# Patient Record
Sex: Male | Born: 1950 | Race: White | Hispanic: No | Marital: Married | Smoking: Former smoker
Health system: Southern US, Community
[De-identification: ages and names within clinical notes are randomized; demographics above are authoritative.]

## PROBLEM LIST (undated history)

## (undated) DIAGNOSIS — N486 Induration penis plastica: Secondary | ICD-10-CM

## (undated) DIAGNOSIS — E538 Deficiency of other specified B group vitamins: Secondary | ICD-10-CM

## (undated) DIAGNOSIS — F1020 Alcohol dependence, uncomplicated: Secondary | ICD-10-CM

## (undated) DIAGNOSIS — K3532 Acute appendicitis with perforation and localized peritonitis, without abscess: Secondary | ICD-10-CM

## (undated) DIAGNOSIS — I2699 Other pulmonary embolism without acute cor pulmonale: Secondary | ICD-10-CM

## (undated) DIAGNOSIS — R413 Other amnesia: Secondary | ICD-10-CM

## (undated) DIAGNOSIS — F1027 Alcohol dependence with alcohol-induced persisting dementia: Secondary | ICD-10-CM

## (undated) DIAGNOSIS — D751 Secondary polycythemia: Secondary | ICD-10-CM

## (undated) DIAGNOSIS — H1010 Acute atopic conjunctivitis, unspecified eye: Secondary | ICD-10-CM

## (undated) DIAGNOSIS — D45 Polycythemia vera: Secondary | ICD-10-CM

## (undated) DIAGNOSIS — R270 Ataxia, unspecified: Secondary | ICD-10-CM

## (undated) DIAGNOSIS — E785 Hyperlipidemia, unspecified: Secondary | ICD-10-CM

## (undated) DIAGNOSIS — D126 Benign neoplasm of colon, unspecified: Secondary | ICD-10-CM

## (undated) DIAGNOSIS — I10 Essential (primary) hypertension: Secondary | ICD-10-CM

## (undated) HISTORY — DX: Induration penis plastica: N48.6

## (undated) HISTORY — DX: Alcohol dependence with alcohol-induced persisting dementia: F10.27

## (undated) HISTORY — DX: Acute atopic conjunctivitis, unspecified eye: H10.10

## (undated) HISTORY — DX: Deficiency of other specified B group vitamins: E53.8

## (undated) HISTORY — DX: Benign neoplasm of colon, unspecified: D12.6

## (undated) HISTORY — DX: Polycythemia vera: D45

## (undated) HISTORY — DX: Other pulmonary embolism without acute cor pulmonale: I26.99

## (undated) HISTORY — DX: Acute appendicitis with perforation and localized peritonitis, without abscess: K35.32

## (undated) HISTORY — DX: Essential (primary) hypertension: I10

## (undated) HISTORY — PX: INGUINAL HERNIA REPAIR: SUR1180

## (undated) HISTORY — DX: Ataxia, unspecified: R27.0

## (undated) HISTORY — DX: Other amnesia: R41.3

## (undated) HISTORY — DX: Hyperlipidemia, unspecified: E78.5

## (undated) HISTORY — DX: Alcohol dependence, uncomplicated: F10.20

---

## 1971-12-08 HISTORY — PX: TONSILLECTOMY: SUR1361

## 1995-12-08 HISTORY — PX: FRACTURE SURGERY: SHX138

## 2003-05-02 ENCOUNTER — Ambulatory Visit (HOSPITAL_COMMUNITY): Admission: RE | Admit: 2003-05-02 | Discharge: 2003-05-02 | Payer: Self-pay | Admitting: Neurology

## 2003-05-04 ENCOUNTER — Ambulatory Visit (HOSPITAL_COMMUNITY): Admission: RE | Admit: 2003-05-04 | Discharge: 2003-05-04 | Payer: Self-pay | Admitting: Neurology

## 2003-05-04 ENCOUNTER — Encounter: Payer: Self-pay | Admitting: Neurology

## 2003-05-21 ENCOUNTER — Encounter (INDEPENDENT_AMBULATORY_CARE_PROVIDER_SITE_OTHER): Payer: Self-pay | Admitting: *Deleted

## 2003-05-21 ENCOUNTER — Ambulatory Visit (HOSPITAL_COMMUNITY): Admission: RE | Admit: 2003-05-21 | Discharge: 2003-05-21 | Payer: Self-pay | Admitting: Neurology

## 2003-10-14 ENCOUNTER — Ambulatory Visit (HOSPITAL_COMMUNITY): Admission: RE | Admit: 2003-10-14 | Discharge: 2003-10-14 | Payer: Self-pay | Admitting: Neurology

## 2007-03-03 ENCOUNTER — Ambulatory Visit (HOSPITAL_COMMUNITY)
Admission: RE | Admit: 2007-03-03 | Discharge: 2007-03-03 | Payer: Self-pay | Admitting: Physical Medicine and Rehabilitation

## 2008-12-28 ENCOUNTER — Ambulatory Visit (HOSPITAL_COMMUNITY): Admission: RE | Admit: 2008-12-28 | Discharge: 2008-12-28 | Payer: Self-pay | Admitting: Surgery

## 2011-03-23 LAB — DIFFERENTIAL
Basophils Absolute: 0 10*3/uL (ref 0.0–0.1)
Basophils Relative: 1 % (ref 0–1)
Eosinophils Absolute: 0.1 10*3/uL (ref 0.0–0.7)
Eosinophils Relative: 1 % (ref 0–5)
Lymphocytes Relative: 31 % (ref 12–46)
Lymphs Abs: 1.5 10*3/uL (ref 0.7–4.0)
Monocytes Absolute: 0.6 10*3/uL (ref 0.1–1.0)
Monocytes Relative: 12 % (ref 3–12)
Neutro Abs: 2.7 10*3/uL (ref 1.7–7.7)
Neutrophils Relative %: 56 % (ref 43–77)

## 2011-03-23 LAB — COMPREHENSIVE METABOLIC PANEL
ALT: 20 U/L (ref 0–53)
AST: 21 U/L (ref 0–37)
Albumin: 3.9 g/dL (ref 3.5–5.2)
Alkaline Phosphatase: 47 U/L (ref 39–117)
BUN: 6 mg/dL (ref 6–23)
CO2: 28 mEq/L (ref 19–32)
Calcium: 9.8 mg/dL (ref 8.4–10.5)
Chloride: 101 mEq/L (ref 96–112)
Creatinine, Ser: 0.78 mg/dL (ref 0.4–1.5)
GFR calc Af Amer: >60 mL/min (ref >60)
GFR calc non Af Amer: >60 mL/min (ref >60)
Glucose, Bld: 101 mg/dL — ABNORMAL HIGH (ref 70–99)
Potassium: 4.3 mEq/L (ref 3.5–5.1)
Sodium: 137 mEq/L (ref 135–145)
Total Bilirubin: 0.8 mg/dL (ref 0.3–1.2)
Total Protein: 6.6 g/dL (ref 6.0–8.3)

## 2011-03-23 LAB — CBC
HCT: 44.1 % (ref 39.0–52.0)
Hemoglobin: 15.1 g/dL (ref 13.0–17.0)
MCHC: 34.3 g/dL (ref 30.0–36.0)
MCV: 100.6 fL — ABNORMAL HIGH (ref 78.0–100.0)
Platelets: 203 10*3/uL (ref 150–400)
RBC: 4.38 MIL/uL (ref 4.22–5.81)
RDW: 13.2 % (ref 11.5–15.5)
WBC: 4.9 10*3/uL (ref 4.0–10.5)

## 2011-03-23 LAB — ETHANOL: Alcohol, Ethyl (B): <5 mg/dL (ref 0–10)

## 2011-04-21 NOTE — Op Note (Signed)
NAME:  John [name], John [name]              ACCOUNT NO.:  [id number]   MEDICAL RECORD NO.:  [id number]          PATIENT TYPE:  AMB   LOCATION:  DAY                          FACILITY:  [facility]   PHYSICIAN:  Sandria Bales. Ezzard Standing, M.D.  DATE OF BIRTH:  1951-05-29   DATE OF PROCEDURE:  12/28/2008  DATE OF DISCHARGE:                               OPERATIVE REPORT   Date of surgey ?   PREOPERATIVE DIAGNOSIS:  Bilateral inguinal hernias, left greater than  right.   POSTOPERATIVE DIAGNOSIS:  Bilateral direct inguinal hernias, left  medium, right is small.   PROCEDURES:  Laparoscopic bilateral inguinal hernia repair with precut  atrium mesh.   SURGEON:  D. Ezzard Standing, M.D.   FIRST ASSISTANT:  None.   ANESTHESIA:  General with about 20 mL of 4% Marcaine.   COMPLICATIONS:  None.   INDICATIONS FOR PROCEDURE:  Mr. John [name] is a 60 year old white male who  is a patient of Dr. Camillo Flaming who has symptomatic bilateral inguinal  hernias with the left larger than the right.  I discussed with him about  repairing these with laparoscopic or open and he preferred laparoscopic  repair.   I discussed with him the indications and potential complications of  repair.  Potential complications include but are not limited to  bleeding, infection, nerve injury, recurrence of the hernia.   OPERATIVE NOTE:  The patient was placed in the supine position and given  a general endotracheal anesthetic.  His abdomen was prepped with  chlorhexidine CHG and sterilely draped.  He had a Foley catheter in  place.  He was given 1 gram of Ancef for this procedure.  A time-out was  held identifying the patient and the procedure.   Because his left inguinal hernia was larger, I went on the left side.  As far as my dissection, I went down the midline to the anterior rectus  fascia.  I inserted the PBD balloon, but the first time I inserted it,  the balloon went intra-abdominal, so I removed the balloon and got a  second balloon.   Again, I tried to pass this balloon posterior to the  rectus abdominal muscle, anterior to the posterior rectus fascia into  the preperitoneal space, which I got on the second balloon dissection.   After insufflating the balloon under direct visualization, I then  removed the balloon and placed the 12 mm Hassan trocar at the  infraumbilical incision.  I then placed a 5 mm in the left lower  quadrant and a 5-mm in the right lower quadrant and I dissected out  identifying on both sides pubic bone and Cooper's ligament. On the left  side, he had sort of a medium-sized direct inguinal hernia in the  lateral inguinal floor and on the right side, he had maybe a small  defect in the lateral inguinal floor.  I  could not find an indirect  hernia on either side.   I then carried out a mesh repair using the Atrium precut C-QUR-lite  mesh.  On both sides, I put the mesh around the cord structures.  I  stapled  the mesh medially to the pubic bone, inferiorly to Cooper's  ligament, superiorly to the transversalis fascia.  I avoided staples  lateral to the cord structures and inferior to the ilioinguinal  ligament.  I tacked the tail that I put around the cord down to Cooper's  ligament.   On both sides, the mesh looked good and lay flat.  The patient tolerated  the procedure well.  There was no bleeding or other problems at the end  of the procedure, so I deflated the abdomen and removed the trocars.  There was no bleeding at the trocar site.  The fascial defect was closed  with interrupted 0-Vicryl suture.  The skin site was closed with 4-0  Monocryl suture, painted with tincture of Benzoin and Steri-Strip.   The patient tolerated the procedure well and was transported to the  recovery room in good condition.  Sponge and needle counts were correct  at the end of the case.  The plan is to let [name] go today.      Sandria Bales. Ezzard Standing, M.D.  Electronically Signed     DHN/MEDQ  D:   12/28/2008  T:  12/28/2008  Job:  [id number]   cc:   Dossie Der, MD  Fax: [phone]

## 2013-05-30 ENCOUNTER — Ambulatory Visit: Payer: Self-pay | Admitting: Neurology

## 2013-07-04 ENCOUNTER — Encounter: Payer: Self-pay | Admitting: Neurology

## 2013-07-05 ENCOUNTER — Ambulatory Visit (INDEPENDENT_AMBULATORY_CARE_PROVIDER_SITE_OTHER): Payer: BC Managed Care – PPO | Admitting: Neurology

## 2013-07-05 ENCOUNTER — Encounter: Payer: Self-pay | Admitting: Neurology

## 2013-07-05 VITALS — BP 128/83 | HR 53 | Ht 69.5 in | Wt 154.0 lb

## 2013-07-05 DIAGNOSIS — R279 Unspecified lack of coordination: Secondary | ICD-10-CM

## 2013-07-05 DIAGNOSIS — F1027 Alcohol dependence with alcohol-induced persisting dementia: Secondary | ICD-10-CM

## 2013-07-05 DIAGNOSIS — R270 Ataxia, unspecified: Secondary | ICD-10-CM

## 2013-07-05 DIAGNOSIS — F10988 Alcohol use, unspecified with other alcohol-induced disorder: Secondary | ICD-10-CM

## 2013-07-05 HISTORY — DX: Ataxia, unspecified: R27.0

## 2013-07-05 HISTORY — DX: Alcohol dependence with alcohol-induced persisting dementia: F10.27

## 2013-07-05 MED ORDER — MEMANTINE HCL 28 X 5 MG & 21 X 10 MG PO TABS: ORAL_TABLET | ORAL | Status: DC

## 2013-07-05 NOTE — Patient Instructions (Signed)
Chronic Alcohol use  Alcoholism is an addiction to alcohol. Addiction is a medical illness. It is not an one-time incident of heavy drinking that defines the disease of alcoholism.  The characteristics of addictive disease, such as alcoholism, include behaviors that the person finds pleasurable, at least initially. In alcohol addiction, drinking causes chemical changes in brain activity. This can lead to frequent cravings for alcohol. Unfortunately over time, an increased amount of alcohol is needed to produce the pleasure (tolerance). As a result, the person will start to feel uncomfortable symptoms when he or she is not drinking (withdrawal). Over time, a bad cycle develops. When painful withdrawal symptoms start to appear, alcohol is needed to make the symptoms go away. During this process, the person addicted to alcohol may become so used to the effects of alcohol that the usual signs of intoxication such as slurred speech, a staggering walk, or sleepiness may no longer be shown. Many people addicted to alcohol are able to function and even complete tasks. CAUSES   Drinking heavily and frequently.  Other factors like genetics. SYMPTOMS   Headaches.  Frequent trouble falling or staying asleep (insomnia).  Irritability.  Uncontrolled shaking or movement (tremors).  Forgetting events (brownouts) or passing out (blackouts).  Seizures or hallucinations (delirium tremens).  Problems at work or at home that are related to drinking.  Medical problems related to drinking such as heart disease, stroke, high blood pressure, diabetes, stomach ulcers, bleeding from the GI tract, and liver failure.  Trauma (falls, broken bones, automobile crashes). TREATMENT  Alcoholism usually gets worse over time and almost never gets better without treatment. Your caregiver can help recommend a course of treatment for you depending on how severe your symptoms are and the level of your alcohol abuse. In some  patients, stopping alcohol use or even decreasing use can bring about withdrawal symptoms that are dangerous or even deadly. For this reason, hospitalization is sometimes required to medically stabilize a patient.  If hospitalization is not required, but the risk of withdrawal is high, a detoxification (detox) facility may be recommended as an initial treatment step. In a detox center, medications can be given to protect against seizures and other withdrawal symptoms. Rehabilitation treatment may also be necessary. This is the process of treating the psychological and lifestyle element of addiction. There is both inpatient and outpatient rehabilitation treatment. Inpatient programs help patients through a systematic plan of psychological questioning, both individually and in groups, and at times using a "twelve-step" format. Outpatient rehabilitation programs have a similar structure and are aimed at promoting continued sobriety and preventing relapse into addiction. Make treatment decisions together with your caregiver. HOME CARE INSTRUCTIONS   If you are concerned about your alcohol use, talk to someone who can help. Trying to quit on your own is not easy. It can even be medically dangerous. This is especially true if you have been drinking heavily for a long time.  Call your caregiver, Alcoholics Anonymous, or other alcoholic treatment programs for help.  AL-ANON and ALA-TEEN are support groups for friends and family members of an alcohol or drug dependent person. These people also often need help too. For information about these organizations, check your phone directory or the internet. You can also call a local alcohol or chemical dependency treatment center. Document Released: 12/31/2004 Document Revised: 02/15/2012 Document Reviewed: 05/09/2010 [facility] Patient Information 2014 [place], [place].

## 2013-07-05 NOTE — Progress Notes (Addendum)
[facility]  Provider:  Melvyn Novas, M D  Referring Provider: No ref. provider found Primary Care Physician:  Gweneth Dimitri, MD  Chief Complaint  Patient presents with  . Follow-up    memory, ataxia, rm 10,MMSE:13/30,AFT:9    HPI:  John [name] is a 62 y.o. male  Is seen here as a referral/ revisit  from Dr. Corliss Blacker for memory loss.  The patient was first evaluated in 2004 for memory problems at the time he presented his tremor and a high alcohol DD use powder he was also still an active smoker in 2003 2 2004. In March of this year he presented again and his wife technologist work finding difficulties trouble for example to remember the names of the plants he had gone in the garden, sometimes he was unable to communicate fluidly on the phone especially when it came to on seeing the business related form colds. 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 She has meanwhile decided to close the business and apply for disability.  Now he has meanwhile stop smoking he still consumes about 4 -6 drinks a day. In March it is Mini-Mental Status Examination was 21.0 30 his laboratory results showed B12 deficiency and likely a deficiency for other B. vitamins, microcytosis  The last time the patient had completely stopped drinking for a period of time was 6 years ago in preparation for hernia surgery. His physical exam continued to show memory loss, hyperreflexia, nystagmus and ataxia. There was neuropathy as well. The family is well aware of the memory loss relation contribution of Aquaphor and a supportive. They're discussing today to get Mr. [name] entered treatment he will try to stop drinking on his on both think he would find more support in seeing Dr. Sharyon Medicus and visiting AA meetings if possible.  Today's Mini-Mental Status Examination short-circuiting out of 30 points his animal fluency test was only 9, he was not able to write a fluent sentence and he has a  mild tremor when writing. 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    Review of Systems: Out of a complete 14 system review, the patient complains of only the following symptoms, and all other reviewed systems are negative.  History   Social History  . Marital Status: Married    Spouse Name: N/A    Number of Children: 2  . Years of Education: N/A   Occupational History  . [employer]crete Business    Social History Main Topics  . Smoking status: Former Smoker    Quit date: 12/07/1972  . Smokeless tobacco: Not on file  . Alcohol Use: Yes     Comment: 3 glasses of wine daily  . Drug Use: No  . Sexually Active: Not on file   Other Topics Concern  . Not on file   Social History Narrative  . No narrative on file    Family History  Problem Relation Age of Onset  . Glaucoma Mother   . Stroke Father     Past Medical History  Diagnosis Date  . Memory loss     dementia was in question in 2004  . Alcohol dependence   . Hypertension   . Hyperlipemia   . Vitamin B 12 deficiency   . Adenomatous colon polyp   . Peyronie's disease     presumptive,resolved  . Conjunctivitis, allergic     Past Surgical History  Procedure Laterality Date  .  Tonsillectomy  1973  . Inguinal hernia repair Bilateral     w/ mesh  . Fracture surgery  1997    leg    Current Outpatient Prescriptions  Medication Sig Dispense Refill  . B COMPLEX VITAMINS PO Take by mouth 3 (three) times daily.      Marland Kitchen lisinopril (PRINIVIL,ZESTRIL) 20 MG tablet Take 20 mg by mouth daily.       No current facility-administered medications for this visit.    Allergies as of 07/05/2013 - Review Complete 07/05/2013  Allergen Reaction Noted  . Seasonal ic (cholestatin)  07/04/2013    Vitals: BP 128/83  Pulse 53  Ht 5' 9.5" (1.765 m)  Wt 154 lb (69.854 kg)  BMI 22.42 kg/m2 Last Weight:  Wt Readings from Last 1 Encounters:  07/05/13 154 lb  (69.854 kg)   Last Height:   Ht Readings from Last 1 Encounters:  07/05/13 5' 9.5" (1.765 m)   BMI  .  Physical exam:  General: The patient is awake, alert and appears not in acute distress.  Head: Normocephalic, atraumatic. Neck is supple. Cardiovascular:  Regular rate and rhythm, without  murmurs or carotid bruit, and without distended neck veins. Respiratory: Lungs are clear to auscultation. Skin:  Without evidence of edema, has plantar and palmar erythema , spider navi , no  rash Trunk: BMI is normal, normal  posture.  Neurologic exam : The patient is awake and alert, oriented to place and time.  Memory impaired -  There is a reduced attention span & concentration ability. Speech is non- fluent with repetition . Mood and affect are appropriate.  Cranial nerves: Pupils are equal and briskly reactive to light. Funduscopic exam without  evidence of pallor or edema. Extraocular movements with  Nystagmus. Wernicke .  Visual fields by finger perimetry are intact. Hearing to finger rub intact.  Facial sensation intact to fine touch. Facial motor strength is symmetric and tongue and uvula move midline.  Motor exam:  Brisk   tone and normal muscle bulk and symmetric normal strength in all extremities.  Sensory:  Fine touch, pinprick and vibration were tested in all extremities. Proprioception is  Reduced to fine touch and vibration normal.  Coordination: Rapid alternating movements in the fingers/hands is slowed . Finger-to-nose maneuver with ataxia, dysmetria or tremor.  Gait and station: Patient walks without assistive device . Strength within normal limits. Stance is stable and normal , but romberg is positive . Tandem gait is fragmented. Deep tendon reflexes: in the  upper and lower extremities are symmetric and intact. Babinski maneuver response is  downgoing.   Assessment:  After physical and neurologic examination, review of laboratory studies, imaging, neurophysiology testing and  pre-existing records, assessment is ETOH dependency with alcohol induced dementia and  Organic brain changes.   Plan:  Treatment plan and additional workup :  A referral to alcohol treatment - Dr March Rummage , AA meetings and possible evaluation for home safety.   Positive family history of alcoholism  , brother , possible mother.  Both parents had alzheimer's type dementia.  Added Namenda . Continue B vitamins.  Patient had a head injury  in 1998-  MRI ordered.

## 2013-07-05 NOTE — Addendum Note (Signed)
Addended by: Melvyn Novas on: 07/05/2013 09:20 AM   Modules accepted: Orders

## 2013-08-10 ENCOUNTER — Other Ambulatory Visit: Payer: Self-pay

## 2013-08-10 MED ORDER — MEMANTINE HCL ER 28 MG PO CP24: 28.0000 mg | ORAL_CAPSULE | Freq: Every day | ORAL | Status: DC

## 2013-10-06 ENCOUNTER — Ambulatory Visit: Payer: Self-pay | Admitting: Nurse Practitioner

## 2014-12-07 DIAGNOSIS — I2699 Other pulmonary embolism without acute cor pulmonale: Secondary | ICD-10-CM

## 2014-12-07 HISTORY — DX: Other pulmonary embolism without acute cor pulmonale: I26.99

## 2015-06-18 ENCOUNTER — Telehealth: Payer: Self-pay | Admitting: Neurology

## 2015-06-18 NOTE — Telephone Encounter (Signed)
Pts wife called concerning her husband, he is having a hard time breathing and feels that it is related to his dementia, she was told that if she feels that it is urgent to go the ER for immediate help. Please call and advise [phone]

## 2015-06-18 NOTE — Telephone Encounter (Signed)
Returned pt's call, no answer at the number listed. If pt is having trouble breathing, he needs to go to the ER.

## 2015-06-18 NOTE — Telephone Encounter (Signed)
Tried to call again, no answer. Left a message asking them to call us back if they need [place].  Pt needs to be advised to go to ER if he is having trouble breathing.

## 2015-06-19 NOTE — Telephone Encounter (Signed)
Attempted to call the pt for the third time at the number listed, no answer.

## 2015-06-24 ENCOUNTER — Inpatient Hospital Stay (HOSPITAL_COMMUNITY): Payer: Medicare Other

## 2015-06-24 ENCOUNTER — Encounter (HOSPITAL_COMMUNITY): Payer: Self-pay | Admitting: Emergency Medicine

## 2015-06-24 ENCOUNTER — Inpatient Hospital Stay (HOSPITAL_COMMUNITY)
Admission: EM | Admit: 2015-06-24 | Discharge: 2015-06-25 | DRG: 299 | Disposition: A | Discharge status: Home / Self Care | Payer: Medicare Other | Attending: Internal Medicine | Admitting: Internal Medicine

## 2015-06-24 DIAGNOSIS — I82411 Acute embolism and thrombosis of right femoral vein: Secondary | ICD-10-CM | POA: Diagnosis present

## 2015-06-24 DIAGNOSIS — I82431 Acute embolism and thrombosis of right popliteal vein: Secondary | ICD-10-CM | POA: Diagnosis present

## 2015-06-24 DIAGNOSIS — Z87891 Personal history of nicotine dependence: Secondary | ICD-10-CM

## 2015-06-24 DIAGNOSIS — F1010 Alcohol abuse, uncomplicated: Secondary | ICD-10-CM | POA: Insufficient documentation

## 2015-06-24 DIAGNOSIS — E785 Hyperlipidemia, unspecified: Secondary | ICD-10-CM | POA: Diagnosis present

## 2015-06-24 DIAGNOSIS — Z888 Allergy status to other drugs, medicaments and biological substances status: Secondary | ICD-10-CM | POA: Diagnosis not present

## 2015-06-24 DIAGNOSIS — E538 Deficiency of other specified B group vitamins: Secondary | ICD-10-CM | POA: Diagnosis present

## 2015-06-24 DIAGNOSIS — F1020 Alcohol dependence, uncomplicated: Secondary | ICD-10-CM | POA: Diagnosis present

## 2015-06-24 DIAGNOSIS — F1027 Alcohol dependence with alcohol-induced persisting dementia: Secondary | ICD-10-CM | POA: Diagnosis present

## 2015-06-24 DIAGNOSIS — R0600 Dyspnea, unspecified: Secondary | ICD-10-CM | POA: Diagnosis present

## 2015-06-24 DIAGNOSIS — I2699 Other pulmonary embolism without acute cor pulmonale: Secondary | ICD-10-CM

## 2015-06-24 DIAGNOSIS — N486 Induration penis plastica: Secondary | ICD-10-CM | POA: Diagnosis present

## 2015-06-24 DIAGNOSIS — I10 Essential (primary) hypertension: Secondary | ICD-10-CM | POA: Diagnosis present

## 2015-06-24 DIAGNOSIS — R270 Ataxia, unspecified: Secondary | ICD-10-CM | POA: Diagnosis present

## 2015-06-24 DIAGNOSIS — R0602 Shortness of breath: Secondary | ICD-10-CM

## 2015-06-24 DIAGNOSIS — I824Y1 Acute embolism and thrombosis of unspecified deep veins of right proximal lower extremity: Secondary | ICD-10-CM | POA: Diagnosis present

## 2015-06-24 DIAGNOSIS — Z86711 Personal history of pulmonary embolism: Secondary | ICD-10-CM | POA: Diagnosis present

## 2015-06-24 LAB — URINALYSIS, ROUTINE W REFLEX MICROSCOPIC
Bilirubin Urine: NEGATIVE
Glucose, UA: NEGATIVE mg/dL
Hgb urine dipstick: NEGATIVE
Ketones, ur: 15 mg/dL — AB
LEUKOCYTES UA: NEGATIVE
Nitrite: NEGATIVE
Protein, ur: NEGATIVE mg/dL
Specific Gravity, Urine: 1.028 (ref 1.005–1.030)
Urobilinogen, UA: 0.2 mg/dL (ref 0.0–1.0)
pH: 7 (ref 5.0–8.0)

## 2015-06-24 LAB — CBC
HCT: 47.7 % (ref 39.0–52.0)
Hemoglobin: 16.2 g/dL (ref 13.0–17.0)
MCH: 34.6 pg — ABNORMAL HIGH (ref 26.0–34.0)
MCHC: 34 g/dL (ref 30.0–36.0)
MCV: 101.9 fL — ABNORMAL HIGH (ref 78.0–100.0)
Platelets: 154 10*3/uL (ref 150–400)
RBC: 4.68 MIL/uL (ref 4.22–5.81)
RDW: 13.4 % (ref 11.5–15.5)
WBC: 7.6 10*3/uL (ref 4.0–10.5)

## 2015-06-24 LAB — HEPATIC FUNCTION PANEL
ALT: 14 U/L — ABNORMAL LOW (ref 17–63)
AST: 22 U/L (ref 15–41)
Albumin: 3.9 g/dL (ref 3.5–5.0)
Alkaline Phosphatase: 61 U/L (ref 38–126)
Bilirubin, Direct: 0.1 mg/dL (ref 0.1–0.5)
Indirect Bilirubin: 0.7 mg/dL (ref 0.3–0.9)
Total Bilirubin: 0.8 mg/dL (ref 0.3–1.2)
Total Protein: 6.7 g/dL (ref 6.5–8.1)

## 2015-06-24 LAB — BASIC METABOLIC PANEL
Anion gap: 13 (ref 5–15)
BUN: 11 mg/dL (ref 6–20)
CO2: 24 mmol/L (ref 22–32)
Calcium: 9.8 mg/dL (ref 8.9–10.3)
Chloride: 101 mmol/L (ref 101–111)
Creatinine, Ser: 0.96 mg/dL (ref 0.61–1.24)
GFR calc Af Amer: >60 mL/min (ref >60)
GFR calc non Af Amer: >60 mL/min (ref >60)
Glucose, Bld: 96 mg/dL (ref 65–99)
Potassium: 4 mmol/L (ref 3.5–5.1)
Sodium: 138 mmol/L (ref 135–145)

## 2015-06-24 LAB — BRAIN NATRIURETIC PEPTIDE: B Natriuretic Peptide: 123.8 pg/mL — ABNORMAL HIGH (ref 0.0–100.0)

## 2015-06-24 LAB — I-STAT TROPONIN, ED: TROPONIN I, POC: 0.01 ng/mL (ref 0.00–0.08)

## 2015-06-24 LAB — PROTIME-INR
INR: 1.1 (ref 0.00–1.49)
Prothrombin Time: 14.4 seconds (ref 11.6–15.2)

## 2015-06-24 MED ORDER — SODIUM CHLORIDE 0.45 % IV SOLN
INTRAVENOUS | Status: DC
  Administered 2015-06-24: 23:00:00 via INTRAVENOUS

## 2015-06-24 MED ORDER — ALBUTEROL SULFATE HFA 108 (90 BASE) MCG/ACT IN AERS: 1.0000 | INHALATION_SPRAY | Freq: Four times a day (QID) | RESPIRATORY_TRACT | Status: DC | PRN

## 2015-06-24 MED ORDER — MAGNESIUM HYDROXIDE 400 MG/5ML PO SUSP
30.0000 mL | Freq: Every day | ORAL | Status: DC | PRN
Start: 2015-06-24 — End: 2015-06-25
  Filled 2015-06-24: qty 30

## 2015-06-24 MED ORDER — ADULT MULTIVITAMIN W/MINERALS CH
1.0000 | ORAL_TABLET | Freq: Every day | ORAL | Status: DC
Start: 2015-06-24 — End: 2015-06-25
  Administered 2015-06-25: 1 via ORAL
  Filled 2015-06-24 (×2): qty 1

## 2015-06-24 MED ORDER — ALBUTEROL SULFATE (2.5 MG/3ML) 0.083% IN NEBU: 2.5000 mg | INHALATION_SOLUTION | Freq: Four times a day (QID) | RESPIRATORY_TRACT | Status: DC | PRN

## 2015-06-24 MED ORDER — SODIUM CHLORIDE 0.9 % IJ SOLN: 3.0000 mL | Freq: Two times a day (BID) | INTRAMUSCULAR | Status: DC

## 2015-06-24 MED ORDER — B COMPLEX-C PO TABS
1.0000 | ORAL_TABLET | Freq: Three times a day (TID) | ORAL | Status: DC
  Administered 2015-06-25 (×2): 1 via ORAL
  Filled 2015-06-24 (×4): qty 1

## 2015-06-24 MED ORDER — ACETAMINOPHEN 650 MG RE SUPP: 650.0000 mg | Freq: Four times a day (QID) | RECTAL | Status: DC | PRN

## 2015-06-24 MED ORDER — HEPARIN BOLUS VIA INFUSION
4000.0000 [IU] | Freq: Once | INTRAVENOUS | Status: AC
  Administered 2015-06-24: 4000 [IU] via INTRAVENOUS
  Filled 2015-06-24: qty 4000

## 2015-06-24 MED ORDER — B COMPLEX VITAMINS PO CAPS: 1.0000 | ORAL_CAPSULE | Freq: Three times a day (TID) | ORAL | Status: DC

## 2015-06-24 MED ORDER — HEPARIN (PORCINE) IN NACL 100-0.45 UNIT/ML-% IJ SOLN
1250.0000 [IU]/h | INTRAMUSCULAR | Status: DC
  Administered 2015-06-24: 1250 [IU]/h via INTRAVENOUS
  Filled 2015-06-24: qty 250

## 2015-06-24 MED ORDER — ACETAMINOPHEN 325 MG PO TABS
650.0000 mg | ORAL_TABLET | Freq: Four times a day (QID) | ORAL | Status: DC | PRN
Start: 2015-06-24 — End: 2015-06-25

## 2015-06-24 MED ORDER — LISINOPRIL 10 MG PO TABS
10.0000 mg | ORAL_TABLET | Freq: Every day | ORAL | Status: DC
  Administered 2015-06-25: 10 mg via ORAL
  Filled 2015-06-24: qty 1

## 2015-06-24 MED ORDER — ONDANSETRON HCL 4 MG PO TABS: 4.0000 mg | ORAL_TABLET | Freq: Four times a day (QID) | ORAL | Status: DC | PRN

## 2015-06-24 MED ORDER — VITAMIN B-1 100 MG PO TABS
100.0000 mg | ORAL_TABLET | Freq: Every day | ORAL | Status: DC
  Administered 2015-06-24 – 2015-06-25 (×2): 100 mg via ORAL
  Filled 2015-06-24 (×2): qty 1

## 2015-06-24 MED ORDER — ONDANSETRON HCL 4 MG/2ML IJ SOLN: 4.0000 mg | Freq: Four times a day (QID) | INTRAMUSCULAR | Status: DC | PRN

## 2015-06-24 NOTE — ED Notes (Signed)
Pt is confused-- has hx of same, wife with pt-- states only drinking 2 -3 glasses of red wine a day presently. Pt currently has tremors in hands- when asked wife about those, states he always has them. Pt hx of ETOH abuse- unable to answer any questions -- defers to wife.

## 2015-06-24 NOTE — H&P (Addendum)
[facility] History and Physical   John [name] is an 64 y.o. male.  Chief Complaint: Dyspnea HPI: 64 yo with long hx etoh abuse, has had progressive dementia over last 10-12 years, continues to drink.  Presenting with new diagnosis of PE in the setting of one month hx of worsening DOE.  Wife is historian and caretaker of patient who has significant memory loss and some physical debilities. Walks slowly with a shuffling gait , has a tremor and progressive memory loss. Worked up for B12 deficiency and was treated with IM and now po B12 with f/u levels good per wife. Worked up for Parkinson's which was negative. Possibility that head trauma ("truss fell on his head at work") about 15 yrs ago may have contributed to his mental decline.    Pt's wife noted DOE at the park during their usual walk about a month ago.  They had to stop so he could catch his breath, they usually walk about 45 min.  A week later he was dyspneic after going to the bathroom. She took him to their PCP and they gave him 7days po abx for possible PNA.  Then he was supposed to have a colonscopy and that was cancelled due to his breathing issues so the PCP ordered an outpatient chest CT which was done today at [facility] in [place].   The report showed >> "Prominent filling defects are present at the distal aspect of the bilateral main pulmonary arteries with extension into the proximal aspect of the right main pulmonary artery.  Additional filling defects extend into lobar branches of both lungs.  The thoracic aorta appears normal.  No adenopathy demonstrated. The lungs are clear."  Patient was told to come to ED here.  He has no complaints, no CP , SOB at this time. Wife gives all relevant history. No recent CP, fevers, chills, cough , hemoptysis.  No recent abd pain, n/v/d, no difficulty voiding, dysuria, joint pains, rash, swelling. No HA's.     Past Medical History  Past Medical History  Diagnosis Date   . Memory loss     dementia was in question in 2004  . Alcohol dependence   . Hypertension   . Hyperlipemia   . Vitamin B 12 deficiency   . Adenomatous colon polyp   . Peyronie's disease     presumptive,resolved  . Conjunctivitis, allergic   . Ataxia 07/05/2013  . 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 Tonsillectomy  1973  . Inguinal hernia repair Bilateral     w/ mesh  . Fracture surgery  1997    leg   Family History  Family History  Problem Relation Age of Onset  . Glaucoma Mother   . Stroke Father    Social History  reports that he quit smoking about 42 years ago. He does not have any smokeless tobacco history on file. He reports that he drinks alcohol. He reports that he does not use illicit drugs. Allergies  Allergies  Allergen Reactions  . Seasonal Ic [Cholestatin]    Home medications Prior to Admission medications   Medication Sig Start Date End Date Taking? Authorizing Provider  B COMPLEX VITAMINS PO Take by mouth 3 (three) times daily.   Yes Historical Provider, MD  lisinopril (PRINIVIL,ZESTRIL) 10 MG tablet Take 10 mg by mouth daily.   Yes Historical Provider, MD  loratadine (CLARITIN) 10 MG tablet Take 10 mg  by mouth daily.   Yes Historical Provider, MD  Multiple Vitamins-Minerals (MULTIVITAMIN ADULT) TABS Take 1 tablet by mouth daily.   Yes Historical Provider, MD  PROAIR HFA 108 (90 BASE) MCG/ACT inhaler Inhale 1-2 puffs into the lungs every 6 (six) hours as needed. 06/02/15  Yes Historical Provider, MD  Memantine HCl ER (NAMENDA XR) 28 MG CP24 Take 28 mg by mouth daily. If this medication is still on backorder, please dispense Namenda 10mg  BID #60 + 12 Refills instead.  Thank you. Patient not taking: Reported on 06/24/2015 08/10/13   Larey Seat, MD   Liver Function Tests  Recent Labs Lab 06/24/15 1800  AST 22  ALT 14*  ALKPHOS 61  BILITOT 0.8  PROT 6.7  ALBUMIN 3.9    No results for input(s): LIPASE, AMYLASE in the last 168 hours. CBC  Recent Labs Lab 06/24/15 1800  WBC 7.6  HGB 16.2  HCT 47.7  MCV 101.9*  PLT 607   Basic Metabolic Panel  Recent Labs Lab 06/24/15 1800  NA 138  K 4.0  CL 101  CO2 24  GLUCOSE 96  BUN 11  CREATININE 0.96  CALCIUM 9.8    Filed Vitals:   06/24/15 1830 06/24/15 1900 06/24/15 1930 06/24/15 2000  BP: 148/77 132/82 120/81 126/91  Pulse: 69 64 66 64  Temp:      TempSrc:      Resp: 13 29 19 29   Height:      Weight:      SpO2: 97% 96% 97% 95%   Exam: Alert adult male, not in distress, calm, pleasant, very poor memory No rash, cyanosis or gangrene Sclera anicteric, throat clear No jvd or bruits Chest clear bilat RRR no MRG Abd soft ntnd no mass , ascites or HSM GU normal male RLE calf is swollen compared to left calf Nontender, faint erythema perhaps to R lower leg Pedals pulses intact bilat Neuro knows hospital, not year, doesn't know why he is here Gait not tested, shuffling per wife  Na 138 K 4  Creat 0.96  Alb 3.9   LFT's ok   WBC 7k  Hb 16   plts 154  INR 1.10  Glu 96 EKG (personally reviewd) - NSR 80's, LAFB, no acute ischemic changes CXR pending  Assessment: 1. Subacute bilateral pulm emboli / R calf edema - suspect DVT. Plan admit, IV heparin.  2. Hx etoh abuse - longstanding. Wife if not convinced his memory loss is due to alcohol ("he doesn't drink that much") but notes that it has been progressive.  He has been worked up by the PCP at length. Has dementia with tremor/ gait disturbance all probably from etoh abuse/ nutritional deficiencies associated with etoh abuse. They do take vitamins including thiamine/ B12. 3. B12 deficiency - on po supplements 4. HTN on lisinopril   Plan- admit, IV heparin for now, LE dopplers. Reg diet.   Kelly Splinter, MD [facility] Pager [phone]  If 7PM-7AM, please contact night-coverage [email or web address] Password [email or web address]

## 2015-06-24 NOTE — Progress Notes (Signed)
ANTICOAGULATION CONSULT NOTE - Initial Consult  Pharmacy Consult for Heparin Indication: pulmonary embolus  Allergies  Allergen Reactions  . Seasonal Ic [Cholestatin]     Patient Measurements: Height: 5\' 9"  (175.3 cm) Weight: 156 lb (70.761 kg) IBW/kg (Calculated) : 70.7 Heparin Dosing Weight: 70.7 kg  Vital Signs: Temp: 98.1 F (36.7 C) (07/18 1749) Temp Source: Oral (07/18 1749) BP: 147/88 mmHg (07/18 1807) Pulse Rate: 72 (07/18 1807)  Labs: No results for input(s): HGB, HCT, PLT, APTT, LABPROT, INR, HEPARINUNFRC, CREATININE, CKTOTAL, CKMB, TROPONINI in the last 72 hours.  CrCl cannot be calculated (Patient has no serum creatinine result on file.).   Medical History: Past Medical History  Diagnosis Date  . Memory loss     dementia was in question in 2004  . Alcohol dependence   . Hypertension   . Hyperlipemia   . Vitamin B 12 deficiency   . Adenomatous colon polyp   . Peyronie's disease     presumptive,resolved  . Conjunctivitis, allergic   . Ataxia 07/05/2013  . 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 Pharmacy is consulted to dose heparin for PE. CBC and sCr are wnl and Trop is neg x1.  Goal of Therapy:  Heparin level 0.3-0.7 units/ml Monitor platelets by anticoagulation protocol: Yes   Plan:  Give 4000 units bolus x 1 Start heparin infusion at 1250 units/hr Check anti-Xa level in 6 hours and daily while on heparin Continue to monitor H&H and platelets  Andrey Cota. Diona Foley, PharmD Clinical Pharmacist Pager [phone] 06/24/2015,6:28 PM

## 2015-06-24 NOTE — Progress Notes (Signed)
Report taken from [place], [place] in ED

## 2015-06-24 NOTE — Progress Notes (Signed)
Patient admitted to unit via stretcher, in no distress. Skin intact, IV intact. Heparin drip infusing @ 12.5 mL/hr . Wife at bedside to answer admission hx questions - patient oriented to self and is pleasantly confused at baseline per wife. Placed on tele box #24. Call bell within reach, bed locked and in lowest position. Bed alarm in place. Patient and wife understand how to get into contact with nursing staff. Will continue to monitor patient per MD orders.

## 2015-06-24 NOTE — Progress Notes (Signed)
Attempted report. RN to call back 

## 2015-06-24 NOTE — ED Notes (Signed)
[facility] here [facility] with positive CT scan for bilateral PEs-- has been short of breath for 1 month, has been worked up for pneumonia, has been on antibiotics and inhalers.

## 2015-06-24 NOTE — ED Provider Notes (Signed)
CSN: [id number]     Arrival date & time 06/24/15  1732 History   First MD Initiated Contact with Patient 06/24/15 1755     Chief Complaint  Patient presents with  . pulmonary embolism   . Shortness of Breath     (Consider location/radiation/quality/duration/timing/severity/associated sxs/prior Treatment) HPI  64 year old male presents from the outpatient imaging center with a CT scan that is positive for bilateral pulmonary embolism. The patient has been having shortness of breath on minimal exertion for the past one month. Initially went to urgent care was treated for pneumonia. Since then has not improved. PCP ordered a CT scan which today showed the pulmonary embolism. Patient has not had any leg swelling, recent travel or surgery, or history of blood clots. His sister was diagnosed with a blood clot for the first time a couple years ago. The patient has chronic alcoholism and has dementia and memory loss associated with this. The history is taken from the wife at the bedside. No known history of strokes, hemorrhage, or blood in his stools. No chest pain. No dyspnea at rest.  Past Medical History  Diagnosis Date  . Memory loss     dementia was in question in 2004  . Alcohol dependence   . Hypertension   . Hyperlipemia   . Vitamin B 12 deficiency   . Adenomatous colon polyp   . Peyronie's disease     presumptive,resolved  . Conjunctivitis, allergic   . Ataxia 07/05/2013  . 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 Tonsillectomy  1973  . Inguinal hernia repair Bilateral     w/ mesh  . Fracture surgery  1997    leg   Family History  Problem Relation Age of Onset  . Glaucoma Mother   . Stroke Father    History  Substance Use Topics  . Smoking status: Former Smoker    Quit date: 12/07/1972  . Smokeless tobacco: Not on file  . Alcohol Use: Yes     Comment: 2 glasses of wine daily    Review of Systems   Constitutional: Negative for fever.  Respiratory: Positive for shortness of breath. Negative for cough.   Cardiovascular: Negative for chest pain and leg swelling.  Gastrointestinal: Negative for blood in stool.  All other systems reviewed and are negative.     Allergies  Seasonal ic  Home Medications   Prior to Admission medications   Medication Sig Start Date End Date Taking? Authorizing Provider  B COMPLEX VITAMINS PO Take by mouth 3 (three) times daily.    Historical Provider, MD  lisinopril (PRINIVIL,ZESTRIL) 20 MG tablet Take 20 mg by mouth daily.    Historical Provider, MD  Memantine HCl ER (NAMENDA XR) 28 MG CP24 Take 28 mg by mouth daily. If this medication is still on backorder, please dispense Namenda 10mg  BID #60 + 12 Refills instead.  Thank you. 08/10/13   Carmen Dohmeier, MD   BP 157/90 mmHg  Pulse 63  Temp(Src) 98.1 F (36.7 C) (Oral)  Resp 18  Ht 5\' 9"  (1.753 m)  Wt 156 lb (70.761 kg)  BMI 23.03 kg/m2  SpO2 99% Physical Exam  Constitutional: He appears well-developed and well-nourished.  HENT:  Head: Normocephalic and atraumatic.  Right Ear: External ear normal.  Left Ear: External ear normal.  Nose: Nose normal.  Eyes: Right eye exhibits no discharge. Left eye exhibits no discharge.  Neck: Neck supple.  Cardiovascular: Normal rate, regular rhythm,  normal heart sounds and intact distal pulses.   Pulmonary/Chest: Effort normal and breath sounds normal. He has no wheezes. He has no rales.  Abdominal: Soft. He exhibits no distension. There is no tenderness.  Musculoskeletal: He exhibits edema.  R calf with pitting edema more so than left. No thigh swelling  Neurological: He is alert. He is disoriented. GCS eye subscore is 4. GCS verbal subscore is 4. GCS motor subscore is 6.  Skin: Skin is warm and dry.  Nursing note and vitals reviewed.   ED Course  Procedures (including critical care time) Labs Review Labs Reviewed  CBC - Abnormal; Notable for the  following:    MCV 101.9 (*)    MCH 34.6 (*)    All other components within normal limits  BRAIN NATRIURETIC PEPTIDE - Abnormal; Notable for the following:    B Natriuretic Peptide 123.8 (*)    All other components within normal limits  HEPATIC FUNCTION PANEL - Abnormal; Notable for the following:    ALT 14 (*)    All other components within normal limits  URINALYSIS, ROUTINE W REFLEX MICROSCOPIC (NOT AT [facility]) - Abnormal; Notable for the following:    Ketones, ur 15 (*)    All other components within normal limits  BASIC METABOLIC PANEL  PROTIME-INR  HEPARIN LEVEL (UNFRACTIONATED)  CBC  TSH  I-STAT TROPOININ, ED    Imaging Review No results found.   INDICATIONS: SHORTNESS OF BREATH COMMENTS:    PROCEDURE: QCT 0251- CT PULMONARY ANGIOGRAM - Jun 24 2015  [employer] Accession #: [id number] [employer] Accession #: [id number]     INDICATION: SHORTNESS OF BREATH.  COMPARISON: None.  TECHNIQUE: Routine CT pulmonary angiogram with contrast was performed  using 80 mL of Isovue 370. Multiplanar 2D and angiographic 3D MIP images  were constructed and reviewed. Radiation dose reduction was utilized  (automated exposure control, mA or kV adjustment based on patient size,  or iterative image reconstruction).  Contrast bolus quality: satisfactory  FINDINGS: Prominent filling defects are present at the distal aspect of  the bilateral main pulmonary arteries with extension into the proximal  aspect of the right main pulmonary artery. Additional filling defects  extend into lobar branches of both lungs. The thoracic aorta appears  normal. No adenopathy demonstrated. The lungs are clear.    IMPRESSION: Exam positive for bilateral pulmonary embolism as described  above.  ################################################### ##########CODE CRITICAL REPORT########### ###################################################     EKG Interpretation   Date/Time:  Monday June 24 2015 18:08:15  EDT Ventricular Rate:  75 PR Interval:  166 QRS Duration: 93 QT Interval:  406 QTC Calculation: 453 R Axis:   -69 Text Interpretation:  Sinus rhythm Left anterior fascicular block Abnormal  R-wave progression, late transition no significant change since 2010  Confirmed by Annsleigh Dragoo  MD, Jonia Oakey ([id number]) on 06/24/2015 6:12:35 PM      MDM   Final diagnoses:  SOB (shortness of breath)  Pulmonary embolism    Patient with PEs bilaterally as above. Is well appearing here, no tachypnea while at rest. No hyopxia, hypotension or signs of shock. No prior history of bleeding. At this time is stable but will require anticoagulation and admission. Will start on heparin after discussing risks and benefits with wife. Likely has DVT in RLE. Admit to medicine.     Sherwood Gambler, MD 06/24/15 [phone]

## 2015-06-25 ENCOUNTER — Encounter (HOSPITAL_COMMUNITY): Payer: Self-pay | Admitting: *Deleted

## 2015-06-25 ENCOUNTER — Ambulatory Visit (HOSPITAL_COMMUNITY): Payer: Medicare Other

## 2015-06-25 DIAGNOSIS — I2699 Other pulmonary embolism without acute cor pulmonale: Secondary | ICD-10-CM

## 2015-06-25 LAB — CBC
HEMATOCRIT: 44.8 % (ref 39.0–52.0)
HEMOGLOBIN: 15.2 g/dL (ref 13.0–17.0)
MCH: 34.5 pg — ABNORMAL HIGH (ref 26.0–34.0)
MCHC: 33.9 g/dL (ref 30.0–36.0)
MCV: 101.8 fL — AB (ref 78.0–100.0)
PLATELETS: 132 10*3/uL — AB (ref 150–400)
RBC: 4.4 MIL/uL (ref 4.22–5.81)
RDW: 13.2 % (ref 11.5–15.5)
WBC: 8.2 10*3/uL (ref 4.0–10.5)

## 2015-06-25 LAB — GLUCOSE, CAPILLARY
GLUCOSE-CAPILLARY: 96 mg/dL (ref 65–99)
GLUCOSE-CAPILLARY: 98 mg/dL (ref 65–99)

## 2015-06-25 LAB — TSH: TSH: 2.593 u[IU]/mL (ref 0.350–4.500)

## 2015-06-25 LAB — HEPARIN LEVEL (UNFRACTIONATED): Heparin Unfractionated: 0.56 IU/mL (ref 0.30–0.70)

## 2015-06-25 MED ORDER — THIAMINE HCL 100 MG PO TABS: 100.0000 mg | ORAL_TABLET | Freq: Every day | ORAL | Status: DC

## 2015-06-25 MED ORDER — APIXABAN 5 MG PO TABS: 5.0000 mg | ORAL_TABLET | Freq: Two times a day (BID) | ORAL | Status: DC

## 2015-06-25 MED ORDER — FOLIC ACID 1 MG PO TABS: 1.0000 mg | ORAL_TABLET | Freq: Every day | ORAL | Status: DC

## 2015-06-25 MED ORDER — APIXABAN 5 MG PO TABS
10.0000 mg | ORAL_TABLET | Freq: Two times a day (BID) | ORAL | Status: DC
  Administered 2015-06-25: 10 mg via ORAL
  Filled 2015-06-25: qty 2

## 2015-06-25 MED ORDER — APIXABAN 5 MG PO TABS: 10.0000 mg | ORAL_TABLET | Freq: Two times a day (BID) | ORAL | Status: DC

## 2015-06-25 NOTE — Progress Notes (Signed)
Dorothy Puffer discharged Home with wife per MD order.  Discharge instructions reviewed and discussed with the patient & wife, Karna Christmas, all questions and concerns answered. Copy of instructions, care notes for new diagnosis & medications and scripts given to patient, as well as 30 day trial card for eliquis.    Medication List    TAKE these medications        apixaban 5 MG Tabs tablet  Commonly known as:  ELIQUIS  Take 2 tablets (10 mg total) by mouth 2 (two) times daily.     apixaban 5 MG Tabs tablet  Commonly known as:  ELIQUIS  Take 1 tablet (5 mg total) by mouth 2 (two) times daily.  Start taking on:  07/02/2015     B COMPLEX VITAMINS PO  Take by mouth 3 (three) times daily.     folic acid 1 MG tablet  Commonly known as:  FOLVITE  Take 1 tablet (1 mg total) by mouth daily.     lisinopril 10 MG tablet  Commonly known as:  PRINIVIL,ZESTRIL  Take 10 mg by mouth daily.     loratadine 10 MG tablet  Commonly known as:  CLARITIN  Take 10 mg by mouth daily.     memantine 28 MG Cp24 24 hr capsule  Commonly known as:  NAMENDA XR  Take 28 mg by mouth daily. If this medication is still on backorder, please dispense Namenda 10mg  BID #60 + 12 Refills instead.  Thank you.     MULTIVITAMIN ADULT Tabs  Take 1 tablet by mouth daily.     PROAIR HFA 108 (90 BASE) MCG/ACT inhaler  Generic drug:  albuterol  Inhale 1-2 puffs into the lungs every 6 (six) hours as needed.     thiamine 100 MG tablet  Take 1 tablet (100 mg total) by mouth daily.        Patients skin is clean, dry and intact, no evidence of skin break down. IV site discontinued and catheter remains intact. Site without signs and symptoms of complications. Dressing and pressure applied.  Patient escorted to car by NT in a wheelchair,  no distress noted upon discharge.  Wynetta Emery, Ichelle Harral C 06/25/2015 4:51 PM

## 2015-06-25 NOTE — Progress Notes (Signed)
PT Cancellation Note  Patient Details Name: John [name] MRN: [id number] DOB: 1951-10-19   Cancelled Treatment:    Reason Eval/Treat Not Completed: Patient not medically ready. PT eval for Imminent D/C received, and chart was reviewed. Per RN, pt to go for testing to rule out LE DVT. Will hold PT eval until pt is cleared for OOB mobility. Will check back as schedule allows.    Rolinda Roan 06/25/2015, 11:48 AM]  Rolinda Roan, PT, DPT Acute Rehabilitation Services Pager: [phone]

## 2015-06-25 NOTE — Plan of Care (Signed)
Problem: Phase I Progression Outcomes Goal: Initial discharge plan identified Outcome: Completed/Met Date Met:  06/25/15 To return home with wife

## 2015-06-25 NOTE — Discharge Summary (Signed)
John [name], is a 64 y.o. male  DOB 1951/04/12  MRN [id number].  Admission date:  06/24/2015  Admitting Physician  Roney Jaffe, MD  Discharge Date:  06/25/2015   Primary MD  MCNEILL,WENDY, MD  Recommendations for primary care physician for things to follow:   Outpatient follow-up with hematology for etiology of his PE   Admission Diagnosis  blood clots   Discharge Diagnosis  blood clots     Principal Problem:   Acute pulmonary embolism Active Problems:   Dementia associated with alcoholism   Dyspnea   Essential hypertension   Alcohol abuse      Past Medical History  Diagnosis Date  . Memory loss     dementia was in question in 2004  . Alcohol dependence   . Hypertension   . Hyperlipemia   . Vitamin B 12 deficiency   . Adenomatous colon polyp   . Peyronie's disease     presumptive,resolved  . Conjunctivitis, allergic   . Ataxia 07/05/2013  . 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 Tonsillectomy  1973  . Inguinal hernia repair Bilateral     w/ mesh  . Fracture surgery  1997    leg       HPI  from the history and physical done on the day of admission:    64 yo with long hx etoh abuse, has had progressive dementia over last 10-12 years, continues to drink. Presenting with new diagnosis of PE in the setting of one month hx of worsening DOE. Wife is historian and caretaker of patient who has significant memory loss and some physical debilities. Walks slowly with a shuffling gait , has a tremor and progressive memory loss. Worked up for B12 deficiency and was treated with IM and now po B12 with f/u levels good per wife. Worked up for Parkinson's which was negative. Possibility that head trauma ("truss fell on his head at work")  about 15 yrs ago may have contributed to his mental decline.   Pt's wife noted DOE at the park during their usual walk about a month ago. They had to stop so he could catch his breath, they usually walk about 45 min. A week later he was dyspneic after going to the bathroom. She took him to their PCP and they gave him 7days po abx for possible PNA. Then he was supposed to have a colonscopy and that was cancelled due to his breathing issues so the PCP ordered an outpatient chest CT which was done today at [facility] in [place].   The report showed >> "Prominent filling defects are present at the distal aspect of the bilateral main pulmonary arteries with extension into the proximal aspect of the right main pulmonary artery. Additional filling defects extend into lobar branches of both lungs. The thoracic aorta appears normal. No adenopathy demonstrated. The lungs are clear."  Patient was told to come to ED here. He has no complaints, no CP ,  SOB at this time. Wife gives all relevant history. No recent CP, fevers, chills, cough , hemoptysis. No recent abd pain, n/v/d, no difficulty voiding, dysuria, joint pains, rash, swelling. No HA's.      Hospital Course:     1. Subacute bilateral PE and right leg swelling. He was initially placed on IV heparin and now switched to Eliquis, feels better, in no distress at rest and does not require oxygen. Will be provided with eliquis. Discharged home. Had swelling in the right leg and Right lower extremity venous duplex confirms R. lower extremity DVT,  we will request him to follow with hematology one time for workup of blood clots. He has had one colonoscopy few years ago showing some polyps. No unexplained weight loss. CT chest does not mention any malignancy or suspicious masses.   2. History of alcohol use and long-standing with dementia. Counseled to quit alcohol, placed on folic acid thiamine, continue outpatient B-12 supplementation  as he had diagnosis of B-12 deficiency. Wife and patient not very forthcoming about his exact alcohol intake.   3. Hypertension. On lisinopril.      Discharge Condition: Fair  Follow UP  Follow-up Information    Follow up with MCNEILL,WENDY, MD. Schedule an appointment as soon as possible for a visit in 1 week.   Specialty:  Family Medicine   Contact information:   [place] [address] [phone]       Follow up with [facility], NI, MD. Schedule an appointment as soon as possible for a visit in 1 week.   Specialty:  Hematology and Oncology   Why:  PE ? etiology   Contact information:   [place] [phone]        Consults obtained - None  Diet and Activity recommendation: See Discharge Instructions below  Discharge Instructions       Discharge Instructions    Diet - low sodium heart healthy    Complete by:  As directed      Discharge instructions    Complete by:  As directed   Follow with Primary MD MCNEILL,WENDY, MD in 7 days   Get CBC, CMP, 2 view Chest X ray checked  by Primary MD next visit.    Activity: As tolerated with Full fall precautions use walker/cane & assistance as needed   Disposition Home     Diet: Heart Healthy    For Heart failure patients - Check your Weight same time everyday, if you gain over 2 pounds, or you develop in leg swelling, experience more shortness of breath or chest pain, call your Primary MD immediately. Follow Cardiac Low Salt Diet and 1.5 lit/day fluid restriction.   On your next visit with your primary care physician please Get Medicines reviewed and adjusted.   Please request your Prim.MD to go over all Hospital Tests and Procedure/Radiological results at the follow up, please get all Hospital records sent to your Prim MD by signing hospital release before you go home.   If you experience worsening of your admission symptoms, develop shortness of breath, life  threatening emergency, suicidal or homicidal thoughts you must seek medical attention immediately by calling 911 or calling your MD immediately  if symptoms less severe.  You Must read complete instructions/literature along with all the possible adverse reactions/side effects for all the Medicines you take and that have been prescribed to you. Take any new Medicines after you have completely understood and accpet all the possible  adverse reactions/side effects.   Do not drive, operating heavy machinery, perform activities at heights, swimming or participation in water activities or provide baby sitting services if your were admitted for syncope or siezures until you have seen by Primary MD or a Neurologist and advised to do so again.  Do not drive when taking Pain medications.    Do not take more than prescribed Pain, Sleep and Anxiety Medications  Special Instructions: If you have smoked or chewed Tobacco  in the last 2 yrs please stop smoking, stop any regular Alcohol  and or any Recreational drug use.  Wear Seat belts while driving.   Please note  You were cared for by a hospitalist during your hospital stay. If you have any questions about your discharge medications or the care you received while you were in the hospital after you are discharged, you can call the unit and asked to speak with the hospitalist on call if the hospitalist that took care of you is not available. Once you are discharged, your primary care physician will handle any further medical issues. Please note that NO REFILLS for any discharge medications will be authorized once you are discharged, as it is imperative that you return to your primary care physician (or establish a relationship with a primary care physician if you do not have one) for your aftercare needs so that they can reassess your need for medications and monitor your lab values.     Increase activity slowly    Complete by:  As directed               Discharge Medications       Medication List    TAKE these medications        apixaban 5 MG Tabs tablet  Commonly known as:  ELIQUIS  Take 2 tablets (10 mg total) by mouth 2 (two) times daily.     apixaban 5 MG Tabs tablet  Commonly known as:  ELIQUIS  Take 1 tablet (5 mg total) by mouth 2 (two) times daily.  Start taking on:  07/02/2015     B COMPLEX VITAMINS PO  Take by mouth 3 (three) times daily.     folic acid 1 MG tablet  Commonly known as:  FOLVITE  Take 1 tablet (1 mg total) by mouth daily.     lisinopril 10 MG tablet  Commonly known as:  PRINIVIL,ZESTRIL  Take 10 mg by mouth daily.     loratadine 10 MG tablet  Commonly known as:  CLARITIN  Take 10 mg by mouth daily.     memantine 28 MG Cp24 24 hr capsule  Commonly known as:  NAMENDA XR  Take 28 mg by mouth daily. If this medication is still on backorder, please dispense Namenda 10mg  BID #60 + 12 Refills instead.  Thank you.     MULTIVITAMIN ADULT Tabs  Take 1 tablet by mouth daily.     PROAIR HFA 108 (90 BASE) MCG/ACT inhaler  Generic drug:  albuterol  Inhale 1-2 puffs into the lungs every 6 (six) hours as needed.     thiamine 100 MG tablet  Take 1 tablet (100 mg total) by mouth daily.        Major procedures and Radiology Reports - PLEASE review detailed and final reports for all details, in brief -   Venous US - Lower extremity venous duplex has been completed. Preliminary findings: Right = DVT involving the mid to distal femoral vein, popliteal vein, and proximal  posterior tibial veins. Left = No DVT.   X-ray Chest Pa And Lateral  06/24/2015   CLINICAL DATA:  Shortness of breath, pulmonary embolism, hypertension, dementia, former smoker  EXAM: CHEST  2 VIEW  COMPARISON:  None  FINDINGS: Upper normal heart size.  Mild elongation of thoracic aorta.  Mediastinal contours and pulmonary vascularity normal.  Question calcified RIGHT hilar adenopathy.  Lungs otherwise clear.  No definite infiltrate, pleural  effusion or pneumothorax.  IMPRESSION: Question calcified RIGHT hilar adenopathy.  No acute infiltrate.   Electronically Signed   By: Lavonia Dana M.D.   On: 06/24/2015 21:45    Micro Results      No results found for this or any previous visit (from the past 240 hour(s)).     Today   Subjective    Elyn Peers today has no headache,no chest abdominal pain,no new weakness tingling or numbness, feels much better wants to go home today.     Objective   Blood pressure 144/81, pulse 56, temperature 97.8 F (36.6 C), temperature source Oral, resp. rate 18, height 5\' 9"  (1.753 m), weight 70.761 kg (156 lb), SpO2 98 %.   Intake/Output Summary (Last 24 hours) at 06/25/15 1001 Last data filed at 06/25/15 0900  Gross per 24 hour  Intake 669.38 ml  Output    250 ml  Net 419.38 ml    Exam Awake Alert, Oriented x 1, No new F.N deficits, Normal affect [place].AT,PERRAL Supple Neck,No JVD, No cervical lymphadenopathy appriciated.  Symmetrical Chest wall movement, Good air movement bilaterally, CTAB RRR,No Gallops,Rubs or new Murmurs, No Parasternal Heave +ve B.Sounds, Abd Soft, Non tender, No organomegaly appriciated, No rebound -guarding or rigidity. No Cyanosis, Clubbing or edema, No new Rash or bruise, minimal swelling in the R Leg   Data Review   CBC w Diff: Lab Results  Component Value Date   WBC 8.2 06/25/2015   HGB 15.2 06/25/2015   HCT 44.8 06/25/2015   PLT 132* 06/25/2015   LYMPHOPCT 31 12/25/2008   MONOPCT 12 12/25/2008   EOSPCT 1 12/25/2008   BASOPCT 1 12/25/2008    CMP: Lab Results  Component Value Date   NA 138 06/24/2015   K 4.0 06/24/2015   CL 101 06/24/2015   CO2 24 06/24/2015   BUN 11 06/24/2015   CREATININE 0.96 06/24/2015   PROT 6.7 06/24/2015   ALBUMIN 3.9 06/24/2015   BILITOT 0.8 06/24/2015   ALKPHOS 61 06/24/2015   AST 22 06/24/2015   ALT 14* 06/24/2015  .   Total Time in preparing paper work, data evaluation and todays exam - 35  minutes  Thurnell Lose M.D on 06/25/2015 at 10:01 AM  [facility]   Office  [phone]

## 2015-06-25 NOTE — Progress Notes (Addendum)
Dolores Lory, RN            Called [facility] at [phone]. Talked to [place]. ELIQUIS is covered as a TIER 3 Medication. It comes in the dosages of 2.5mg  and 5mg  only. ELIQUIS does require PRIOR AUTHORIZATION. Prior Authorization phone line is [phone]. Retail pharmacy co-payment for a 30 day supply will be $45.00. Mail Order pharmacy co-payment for a 90 day supply will be $125.00.        NCM gave patient a 30 day savings card for Eliquis, MD will give him a script for the 30 day savings card and patient will need to follow up with his pcp for the pior auth for the remaining.

## 2015-06-25 NOTE — Discharge Instructions (Signed)
Follow with Primary MD MCNEILL,WENDY, MD in 7 days   Get CBC, CMP, 2 view Chest X ray checked  by Primary MD next visit.    Activity: As tolerated with Full fall precautions use walker/cane & assistance as needed   Disposition Home     Diet: Heart Healthy    For Heart failure patients - Check your Weight same time everyday, if you gain over 2 pounds, or you develop in leg swelling, experience more shortness of breath or chest pain, call your Primary MD immediately. Follow Cardiac Low Salt Diet and 1.5 lit/day fluid restriction.   On your next visit with your primary care physician please Get Medicines reviewed and adjusted.   Please request your Prim.MD to go over all Hospital Tests and Procedure/Radiological results at the follow up, please get all Hospital records sent to your Prim MD by signing hospital release before you go home.   If you experience worsening of your admission symptoms, develop shortness of breath, life threatening emergency, suicidal or homicidal thoughts you must seek medical attention immediately by calling 911 or calling your MD immediately  if symptoms less severe.  You Must read complete instructions/literature along with all the possible adverse reactions/side effects for all the Medicines you take and that have been prescribed to you. Take any new Medicines after you have completely understood and accpet all the possible adverse reactions/side effects.   Do not drive, operating heavy machinery, perform activities at heights, swimming or participation in water activities or provide baby sitting services if your were admitted for syncope or siezures until you have seen by Primary MD or a Neurologist and advised to do so again.  Do not drive when taking Pain medications.    Do not take more than prescribed Pain, Sleep and Anxiety Medications  Special Instructions: If you have smoked or chewed Tobacco  in the last 2 yrs please stop smoking, stop any regular  Alcohol  and or any Recreational drug use.  Wear Seat belts while driving.   Please note  You were cared for by a hospitalist during your hospital stay. If you have any questions about your discharge medications or the care you received while you were in the hospital after you are discharged, you can call the unit and asked to speak with the hospitalist on call if the hospitalist that took care of you is not available. Once you are discharged, your primary care physician will handle any further medical issues. Please note that NO REFILLS for any discharge medications will be authorized once you are discharged, as it is imperative that you return to your primary care physician (or establish a relationship with a primary care physician if you do not have one) for your aftercare needs so that they can reassess your need for medications and monitor your lab values.    Information on my medicine - ELIQUIS (apixaban)  This medication education was reviewed with me or my healthcare representative as part of my discharge preparation.  The pharmacist that spoke with me during my hospital stay was:  Lavenia Atlas, [facility]  Why was Eliquis prescribed for you? Eliquis was prescribed to treat blood clots that may have been found in the veins of your legs (deep vein thrombosis) or in your lungs (pulmonary embolism) and to reduce the risk of them occurring again.  What do You need to know about Eliquis ? The starting dose is 10 mg (two 5 mg tablets) taken TWICE daily for the FIRST  SEVEN (7) DAYS, then on  07/02/2015  the dose is reduced to ONE 5 mg tablet taken TWICE daily.  Eliquis may be taken with or without food.   Try to take the dose about the same time in the morning and in the evening. If you have difficulty swallowing the tablet whole please discuss with your pharmacist how to take the medication safely.  Take Eliquis exactly as prescribed and DO NOT stop taking Eliquis without talking to  the doctor who prescribed the medication.  Stopping may increase your risk of developing a new blood clot.  Refill your prescription before you run out.  After discharge, you should have regular check-up appointments with your healthcare provider that is prescribing your Eliquis.    What do you do if you miss a dose? If a dose of ELIQUIS is not taken at the scheduled time, take it as soon as possible on the same day and twice-daily administration should be resumed. The dose should not be doubled to make up for a missed dose.  Important Safety Information A possible side effect of Eliquis is bleeding. You should call your healthcare provider right away if you experience any of the following: ? Bleeding from an injury or your nose that does not stop. ? Unusual colored urine (red or dark brown) or unusual colored stools (red or black). ? Unusual bruising for unknown reasons. ? A serious fall or if you hit your head (even if there is no bleeding).  Some medicines may interact with Eliquis and might increase your risk of bleeding or clotting while on Eliquis. To help avoid this, consult your healthcare provider or pharmacist prior to using any new prescription or non-prescription medications, including herbals, vitamins, non-steroidal anti-inflammatory drugs (NSAIDs) and supplements.  This website has more information on Eliquis (apixaban): http://www.eliquis.com/eliquis/home

## 2015-06-25 NOTE — Progress Notes (Signed)
*  PRELIMINARY RESULTS* Vascular Ultrasound Lower extremity venous duplex has been completed.  Preliminary findings: Right = DVT involving the mid to distal femoral vein, popliteal vein, and proximal posterior tibial veins. Left = No DVT.   Landry Mellow, RDMS, RVT  06/25/2015, 2:01 PM

## 2015-06-25 NOTE — Plan of Care (Signed)
Problem: Consults Goal: Diagnosis - Venous Thromboembolism (VTE) Choose a selection Outcome: Progressing PE (Pulmonary Embolism)  Problem: Phase I Progression Outcomes Goal: Pain controlled with appropriate interventions Outcome: Not Applicable Date Met:  97/28/20 Patient denies any pain

## 2015-06-25 NOTE — Care Management Note (Addendum)
Case Management Note  Patient Details  Name: John [name] MRN: [id number] Date of Birth: 1951-10-15  Subjective/Objective:    Patient lives with spouse, await pt eval.  NCM gave 30 day savings card for Eliquis to RN to give to wife.  MD will give patient script to go with it.  Patient will need to follow up with his PCP for remaining refills and prior authorization.   Per MD, patient is at baseline no hhpt order needed.               Action/Plan:   Expected Discharge Date:                  Expected Discharge Plan:  [place]  In-House Referral:     Discharge planning Services  CM Consult  Post Acute Care Choice:    Choice offered to:     DME Arranged:    DME Agency:     HH Arranged:    [place] Agency:     Status of Service:  In process, will continue to follow  Medicare Important Message Given:    Date Medicare IM Given:    Medicare IM give by:    Date Additional Medicare IM Given:    Additional Medicare Important Message give by:     If discussed at [place] of Stay Meetings, dates discussed:    Additional Comments:  Zenon Mayo, RN 06/25/2015, 10:55 AM

## 2015-06-25 NOTE — Progress Notes (Signed)
ANTICOAGULATION CONSULT NOTE - Initial Consult  Pharmacy Consult for Eliquis Indication: pulmonary embolus  Allergies  Allergen Reactions  . Seasonal Ic [Cholestatin]     Patient Measurements: Height: 5\' 9"  (175.3 cm) Weight: 156 lb (70.761 kg) IBW/kg (Calculated) : 70.7  Vital Signs: Temp: 97.8 F (36.6 C) (07/19 0615) Temp Source: Oral (07/19 0615) BP: 144/81 mmHg (07/19 0615) Pulse Rate: 56 (07/19 0615)  Labs:  Recent Labs  06/24/15 1800 06/25/15 0045  HGB 16.2 15.2  HCT 47.7 44.8  PLT 154 132*  LABPROT 14.4  --   INR 1.10  --   HEPARINUNFRC  --  0.56  CREATININE 0.96  --     Estimated Creatinine Clearance: 77.7 mL/min (by C-G formula based on Cr of 0.96).   Medical History: Past Medical History  Diagnosis Date  . Memory loss     dementia was in question in 2004  . Alcohol dependence   . Hypertension   . Hyperlipemia   . Vitamin B 12 deficiency   . Adenomatous colon polyp   . Peyronie's disease     presumptive,resolved  . Conjunctivitis, allergic   . Ataxia 07/05/2013  . 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 B COMPLEX VITAMINS PO Take by mouth 3 (three) times daily.   06/24/2015 at Unknown time  . lisinopril (PRINIVIL,ZESTRIL) 10 MG tablet Take 10 mg by mouth daily.   06/23/2015 at Unknown time  . loratadine (CLARITIN) 10 MG tablet Take 10 mg by mouth daily.   06/24/2015 at Unknown time  . Multiple Vitamins-Minerals (MULTIVITAMIN ADULT) TABS Take 1 tablet by mouth daily.   06/24/2015 at Unknown time  . PROAIR HFA 108 (90 BASE) MCG/ACT inhaler Inhale 1-2 puffs into the lungs every 6 (six) hours as needed.   Past Week at Unknown time  . Memantine HCl ER (NAMENDA XR) 28 MG CP24 Take 28 mg by mouth daily. If this medication is still on backorder, please dispense Namenda 10mg  BID #60 + 12 Refills instead.  Thank you. (Patient not taking: Reported on  06/24/2015) 30 capsule 12 Not Taking at Unknown time   Scheduled:  . B-complex with vitamin C  1 tablet Oral TID  . lisinopril  10 mg Oral Daily  . multivitamin with minerals  1 tablet Oral Daily  . sodium chloride  3 mL Intravenous Q12H  . thiamine  100 mg Oral Daily   Infusions:  . sodium chloride 50 mL/hr at 06/24/15 2254  . heparin 1,250 Units/hr (06/24/15 1857)    Assessment: 64yo male was started on heparin yesterday for new PE, now to transition to Eliquis.    Plan:  Will d/c heparin and start Eliquis 10mg  PO BID x7d followed by 5mg  BID and begin anticoag education.  Wynona Neat, PharmD, BCPS  06/25/2015,7:47 AM

## 2015-06-25 NOTE — Progress Notes (Signed)
ANTICOAGULATION CONSULT NOTE - Follow Up Consult  Pharmacy Consult for heparin Indication: pulmonary embolus   Labs:  Recent Labs  06/24/15 1800 06/25/15 0045  HGB 16.2 15.2  HCT 47.7 44.8  PLT 154 132*  LABPROT 14.4  --   INR 1.10  --   HEPARINUNFRC  --  0.56  CREATININE 0.96  --      Assessment/Plan:  64yo male therapeutic on heparin with initial dosing for PE. Will continue gtt at current rate and confirm stable with additional level.   Wynona Neat, PharmD, BCPS  06/25/2015,1:19 AM

## 2015-06-27 ENCOUNTER — Telehealth: Payer: Self-pay | Admitting: Hematology and Oncology

## 2015-06-27 NOTE — Telephone Encounter (Signed)
new patient app-s/w patient and gave np appt for 07/28 @ 1:30 w/Dr. Alvy Bimler.

## 2015-07-04 ENCOUNTER — Encounter: Payer: Self-pay | Admitting: Hematology and Oncology

## 2015-07-04 ENCOUNTER — Ambulatory Visit: Payer: Medicare Other

## 2015-07-04 ENCOUNTER — Ambulatory Visit (HOSPITAL_BASED_OUTPATIENT_CLINIC_OR_DEPARTMENT_OTHER): Payer: Medicare Other | Admitting: Hematology and Oncology

## 2015-07-04 VITALS — BP 120/72 | HR 72 | Temp 97.5°F | Resp 18 | Ht 69.0 in | Wt 158.0 lb

## 2015-07-04 DIAGNOSIS — I82401 Acute embolism and thrombosis of unspecified deep veins of right lower extremity: Secondary | ICD-10-CM | POA: Diagnosis not present

## 2015-07-04 DIAGNOSIS — F0390 Unspecified dementia without behavioral disturbance: Secondary | ICD-10-CM

## 2015-07-04 DIAGNOSIS — I2699 Other pulmonary embolism without acute cor pulmonale: Secondary | ICD-10-CM

## 2015-07-04 NOTE — Progress Notes (Signed)
Checked in new pt with no financial concerns. Gave pt my card for any billing or financial questions or concerns.

## 2015-07-04 NOTE — Progress Notes (Signed)
[name] CONSULT NOTE  Patient Care Team: Cari Caraway, MD as PCP - General (Family Medicine)  CHIEF COMPLAINTS/PURPOSE OF CONSULTATION:  Right lower extremity DVT and bilateral PE  HISTORY OF PRESENTING ILLNESS:  John [name] 64 y.o. 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 He is not able to give an accurate history. Majority of the history is obtained through his wife. His wife noted that the patient had progressive shortness of breath with minimal exertion. She recommended further evaluation. CT angiogram in an outside facility dated 06/24/2015 show evidence of bilateral PE. In addition, recent venous Doppler ultrasound showed extensive right lower extremity DVT.  He denies lower extremity swelling, warmth, tenderness & erythema.  He denies recent chest pain on exertion, pre-syncopal episodes, hemoptysis, or palpitation. He denies recent history of trauma, long distance travel, dehydration, recent surgery, smoking or prolonged immobilization. He had no prior history or diagnosis of cancer. His age appropriate screening programs are not up-to-date due to his dementia.  He had prior surgeries before and never had perioperative thromboembolic events. The patient had never been exposed to testosterone replacement therapy. There is positive family history of blood clots.  MEDICAL HISTORY:  Past Medical History  Diagnosis Date  . Memory loss     dementia was in question in 2004  . Alcohol dependence   . Hypertension   . Hyperlipemia   . Vitamin B 12 deficiency   . Adenomatous colon polyp   . Peyronie's disease     presumptive,resolved  . Conjunctivitis, allergic   . Ataxia 07/05/2013  . 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  Tonsillectomy  1973  . Inguinal hernia repair Bilateral     w/ mesh  . Fracture surgery  1997    leg    SOCIAL HISTORY: History   Social History  . Marital Status: Married    Spouse Name: N/A  . Number of Children: 2  . Years of Education: N/A   Occupational History  . [employer]    Social History Main Topics  . Smoking status: Former Smoker    Quit date: 12/07/1972  . Smokeless tobacco: Never Used  . Alcohol Use: 1.8 oz/week    3 Glasses of wine per week     Comment: 2 glasses of wine daily  . Drug Use: No  . Sexual Activity: Yes   Other Topics Concern  . Not on file   Social History Narrative    FAMILY HISTORY: Family History  Problem Relation Age of Onset  . Glaucoma Mother   . Stroke Father   . Clotting disorder Father   . Clotting disorder Sister     ALLERGIES:  is allergic to seasonal ic.  MEDICATIONS:  Current Outpatient Prescriptions  Medication Sig Dispense Refill  . apixaban (ELIQUIS) 5 MG TABS tablet Take 1 tablet (5 mg total) by mouth 2 (two) times daily. 60 tablet 1  . B COMPLEX VITAMINS PO Take by mouth 3 (three) times daily.    . folic acid (FOLVITE) 1 MG tablet Take 1 tablet (1 mg total) by mouth daily. 30 tablet 0  . lisinopril (PRINIVIL,ZESTRIL) 10 MG tablet Take 10 mg by mouth daily.    Marland Kitchen loratadine (CLARITIN) 10 MG tablet Take 10 mg by mouth daily.    Marland Kitchen  Multiple Vitamins-Minerals (MULTIVITAMIN ADULT) TABS Take 1 tablet by mouth daily.    Marland Kitchen thiamine 100 MG tablet Take 1 tablet (100 mg total) by mouth daily. 30 tablet 0   No current facility-administered medications for this visit.    REVIEW OF SYSTEMS:   Constitutional: Denies fevers, chills or abnormal night sweats Eyes: Denies blurriness of vision, double vision or watery eyes Ears, nose, mouth, throat, and face: Denies mucositis or sore throat Respiratory: Denies cough, dyspnea or wheezes Cardiovascular: Denies palpitation, chest discomfort or lower extremity  swelling Gastrointestinal:  Denies nausea, heartburn or change in bowel habits Skin: Denies abnormal skin rashes Lymphatics: Denies new lymphadenopathy or easy bruising Neurological:Denies numbness, tingling or new weaknesses Behavioral/Psych: Mood is stable, no new changes  All other systems were reviewed with the patient and are negative.  PHYSICAL EXAMINATION: ECOG PERFORMANCE STATUS: 1 - Symptomatic but completely ambulatory  Filed Vitals:   07/04/15 1343  BP: 120/72  Pulse: 72  Temp: 97.5 F (36.4 C)  Resp: 18   Filed Weights   07/04/15 1343  Weight: 158 lb (71.668 kg)    GENERAL:alert, no distress and comfortable SKIN: skin color, texture, turgor are normal, no rashes or significant lesions EYES: normal, conjunctiva are pink and non-injected, sclera clear OROPHARYNX:no exudate, no erythema and lips, buccal mucosa, and tongue normal  NECK: supple, thyroid normal size, non-tender, without nodularity LYMPH:  no palpable lymphadenopathy in the cervical, axillary or inguinal LUNGS: clear to auscultation and percussion with normal breathing effort HEART: regular rate & rhythm and no murmurs trace left  lower extremity edema (even though the blood clot is on the right leg). ABDOMEN:abdomen soft, non-tender and normal bowel sounds Musculoskeletal:no cyanosis of digits and no clubbing  PSYCH: alert with word finding difficulties.  NEURO: no focal motor/sensory deficits  LABORATORY DATA:  I have reviewed the data as listed Recent Results (from the past 2160 hour(s))  Basic metabolic panel     Status: None   Collection Time: 06/24/15  6:00 PM  Result Value Ref Range   Sodium 138 135 - 145 mmol/L   Potassium 4.0 3.5 - 5.1 mmol/L   Chloride 101 101 - 111 mmol/L   CO2 24 22 - 32 mmol/L   Glucose, Bld 96 65 - 99 mg/dL   BUN 11 6 - 20 mg/dL   Creatinine, Ser 0.96 0.61 - 1.24 mg/dL   Calcium 9.8 8.9 - 10.3 mg/dL   GFR calc non Af Amer >60 >60 mL/min   GFR calc Af Amer >60 >60  mL/min    Comment: (NOTE) The eGFR has been calculated using the CKD EPI equation. This calculation has not been validated in all clinical situations. eGFR's persistently <60 mL/min signify possible Chronic Kidney Disease.    Anion gap 13 5 - 15  CBC     Status: Abnormal   Collection Time: 06/24/15  6:00 PM  Result Value Ref Range   WBC 7.6 4.0 - 10.5 K/uL   RBC 4.68 4.22 - 5.81 MIL/uL   Hemoglobin 16.2 13.0 - 17.0 g/dL   HCT 47.7 39.0 - 52.0 %   MCV 101.9 (H) 78.0 - 100.0 fL   MCH 34.6 (H) 26.0 - 34.0 pg   MCHC 34.0 30.0 - 36.0 g/dL   RDW 13.4 11.5 - 15.5 %   Platelets 154 150 - 400 K/uL  Protime-INR - (order if Patient is taking Coumadin / Warfarin)     Status: None   Collection Time: 06/24/15  6:00 PM  Result Value Ref Range   Prothrombin Time 14.4 11.6 - 15.2 seconds   INR 1.10 0.00 - 1.49  Hepatic function panel     Status: Abnormal   Collection Time: 06/24/15  6:00 PM  Result Value Ref Range   Total Protein 6.7 6.5 - 8.1 g/dL   Albumin 3.9 3.5 - 5.0 g/dL   AST 22 15 - 41 U/L   ALT 14 (L) 17 - 63 U/L   Alkaline Phosphatase 61 38 - 126 U/L   Total Bilirubin 0.8 0.3 - 1.2 mg/dL   Bilirubin, Direct 0.1 0.1 - 0.5 mg/dL   Indirect Bilirubin 0.7 0.3 - 0.9 mg/dL  I-stat troponin, ED     Status: None   Collection Time: 06/24/15  6:03 PM  Result Value Ref Range   Troponin i, poc 0.01 0.00 - 0.08 ng/mL   Comment 3            Comment: Due to the release kinetics of cTnI, a negative result within the first hours of the onset of symptoms does not rule out myocardial infarction with certainty. If myocardial infarction is still suspected, repeat the test at appropriate intervals.   Brain natriuretic peptide     Status: Abnormal   Collection Time: 06/24/15  6:15 PM  Result Value Ref Range   B Natriuretic Peptide 123.8 (H) 0.0 - 100.0 pg/mL  Urinalysis, Routine w reflex microscopic (not at [facility])     Status: Abnormal   Collection Time: 06/24/15 10:57 PM  Result Value Ref  Range   Color, Urine YELLOW YELLOW   APPearance CLEAR CLEAR   Specific Gravity, Urine 1.028 1.005 - 1.030   pH 7.0 5.0 - 8.0   Glucose, UA NEGATIVE NEGATIVE mg/dL   Hgb urine dipstick NEGATIVE NEGATIVE   Bilirubin Urine NEGATIVE NEGATIVE   Ketones, ur 15 (A) NEGATIVE mg/dL   Protein, ur NEGATIVE NEGATIVE mg/dL   Urobilinogen, UA 0.2 0.0 - 1.0 mg/dL   Nitrite NEGATIVE NEGATIVE   Leukocytes, UA NEGATIVE NEGATIVE    Comment: MICROSCOPIC NOT DONE ON URINES WITH NEGATIVE PROTEIN, BLOOD, LEUKOCYTES, NITRITE, OR GLUCOSE <1000 mg/dL.  Heparin level (unfractionated)     Status: None   Collection Time: 06/25/15 12:45 AM  Result Value Ref Range   Heparin Unfractionated 0.56 0.30 - 0.70 IU/mL    Comment:        IF HEPARIN RESULTS ARE BELOW EXPECTED VALUES, AND PATIENT DOSAGE HAS BEEN CONFIRMED, SUGGEST FOLLOW UP TESTING OF ANTITHROMBIN III LEVELS.   CBC     Status: Abnormal   Collection Time: 06/25/15 12:45 AM  Result Value Ref Range   WBC 8.2 4.0 - 10.5 K/uL   RBC 4.40 4.22 - 5.81 MIL/uL   Hemoglobin 15.2 13.0 - 17.0 g/dL   HCT 44.8 39.0 - 52.0 %   MCV 101.8 (H) 78.0 - 100.0 fL   MCH 34.5 (H) 26.0 - 34.0 pg   MCHC 33.9 30.0 - 36.0 g/dL   RDW 13.2 11.5 - 15.5 %   Platelets 132 (L) 150 - 400 K/uL  TSH     Status: None   Collection Time: 06/25/15 12:45 AM  Result Value Ref Range   TSH 2.593 0.350 - 4.500 uIU/mL  Glucose, capillary     Status: None   Collection Time: 06/25/15  7:45 AM  Result Value Ref Range   Glucose-Capillary 96 65 - 99 mg/dL  Glucose, capillary     Status: None   Collection Time: 06/25/15 12:01 PM  Result  Value Ref Range   Glucose-Capillary 98 65 - 99 mg/dL    RADIOGRAPHIC STUDIES: I have personally reviewed the radiological images as listed and agreed with the findings in the report. X-ray Chest Pa And Lateral  06/24/2015   CLINICAL DATA:  Shortness of breath, pulmonary embolism, hypertension, dementia, former smoker  EXAM: CHEST  2 VIEW  COMPARISON:  None   FINDINGS: Upper normal heart size.  Mild elongation of thoracic aorta.  Mediastinal contours and pulmonary vascularity normal.  Question calcified RIGHT hilar adenopathy.  Lungs otherwise clear.  No definite infiltrate, pleural effusion or pneumothorax.  IMPRESSION: Question calcified RIGHT hilar adenopathy.  No acute infiltrate.   Electronically Signed   By: Lavonia Dana M.D.   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  This last episode of blood clot appeared to be unprovoked. We discussed about the pros and cons about testing for thrombophilia disorder. his current anticoagulation therapy will interfere with some the tests and it is not possible to interpret the test results.  Taking him off the anticoagulation therapy to do the tests may precipitate another thrombotic event. I do not see a reason to order excessive testing to screen for thrombophilia disorder as it would not change our management.  The goal of anticoagulation therapy is for life as unprovoked DVT carry a very high risk of recurrence after stopping treatment.  We discussed about various options of anticoagulation therapies including warfarin, low molecular weight heparin such as Lovenox or newer agents such as Rivaroxaban. Some of the risks and benefits discussed including costs involved, the need for monitoring, risks of life-threatening bleeding/hospitalization, reversibility of each agent in the event of bleeding or overdose, safety profile of each drug and taking into account other social issues such as ease of administration of medications, etc. Ultimately, we have made an informed decision for the patient to continue his treatment with Eliquis.  I recommend his wife not to proceed with CT scan for cancer screening. Again, with his dementia, I do not think we will act upon any abnormalities detected  on imaging study that would require intervention. The patient appeared healthy and appears to be feeling well and I recommend not to proceed with further additional testing.  I reinforced the importance of careful observation for signs of bleeding.   I have not made a return appointment for the patient to come back. I would be happy to assist in perioperative DVT management in the future should he need any interruption of his anticoagulation therapy for elective procedures.  All questions were answered. The patient knows to call the clinic with any problems, questions or concerns. I spent 55 minutes counseling the patient face to face. The total time spent in the appointment was 60 minutes and more than 50% was on counseling.     [facility], [place], MD 07/04/2015 4:52 PM

## 2015-07-28 ENCOUNTER — Other Ambulatory Visit: Payer: Self-pay | Admitting: Internal Medicine

## 2015-11-07 ENCOUNTER — Telehealth: Payer: Self-pay | Admitting: Hematology and Oncology

## 2015-11-07 ENCOUNTER — Ambulatory Visit (HOSPITAL_BASED_OUTPATIENT_CLINIC_OR_DEPARTMENT_OTHER): Payer: Medicare Other

## 2015-11-07 ENCOUNTER — Encounter: Payer: Self-pay | Admitting: Hematology and Oncology

## 2015-11-07 ENCOUNTER — Telehealth: Payer: Self-pay | Admitting: *Deleted

## 2015-11-07 ENCOUNTER — Other Ambulatory Visit: Payer: Self-pay | Admitting: Hematology and Oncology

## 2015-11-07 ENCOUNTER — Ambulatory Visit (HOSPITAL_BASED_OUTPATIENT_CLINIC_OR_DEPARTMENT_OTHER): Payer: Medicare Other | Admitting: Hematology and Oncology

## 2015-11-07 ENCOUNTER — Other Ambulatory Visit (HOSPITAL_BASED_OUTPATIENT_CLINIC_OR_DEPARTMENT_OTHER): Payer: Medicare Other

## 2015-11-07 VITALS — BP 123/81 | HR 73 | Temp 98.2°F | Resp 20 | Ht 69.0 in | Wt 163.2 lb

## 2015-11-07 DIAGNOSIS — I2699 Other pulmonary embolism without acute cor pulmonale: Secondary | ICD-10-CM

## 2015-11-07 DIAGNOSIS — F1027 Alcohol dependence with alcohol-induced persisting dementia: Secondary | ICD-10-CM

## 2015-11-07 DIAGNOSIS — D45 Polycythemia vera: Secondary | ICD-10-CM

## 2015-11-07 DIAGNOSIS — F1097 Alcohol use, unspecified with alcohol-induced persisting dementia: Secondary | ICD-10-CM | POA: Diagnosis not present

## 2015-11-07 HISTORY — DX: Polycythemia vera: D45

## 2015-11-07 LAB — CBC & DIFF AND RETIC
BASO%: 0.3 % (ref 0.0–2.0)
BASOS ABS: 0 10*3/uL (ref 0.0–0.1)
EOS%: 0.1 % (ref 0.0–7.0)
Eosinophils Absolute: 0 10*3/uL (ref 0.0–0.5)
HCT: 56.9 % — ABNORMAL HIGH (ref 38.4–49.9)
HEMOGLOBIN: 19.5 g/dL — AB (ref 13.0–17.1)
IMMATURE RETIC FRACT: 6.9 % (ref 3.00–10.60)
LYMPH%: 20.8 % (ref 14.0–49.0)
MCH: 34.7 pg — AB (ref 27.2–33.4)
MCHC: 34.3 g/dL (ref 32.0–36.0)
MCV: 101.2 fL — ABNORMAL HIGH (ref 79.3–98.0)
MONO#: 0.6 10*3/uL (ref 0.1–0.9)
MONO%: 7.9 % (ref 0.0–14.0)
NEUT#: 5.5 10*3/uL (ref 1.5–6.5)
NEUT%: 70.9 % (ref 39.0–75.0)
Platelets: 169 10*3/uL (ref 140–400)
RBC: 5.62 10*6/uL (ref 4.20–5.82)
RDW: 14.1 % (ref 11.0–14.6)
RETIC %: 1.53 % (ref 0.80–1.80)
RETIC CT ABS: 85.99 10*3/uL (ref 34.80–93.90)
WBC: 7.7 10*3/uL (ref 4.0–10.3)
lymph#: 1.6 10*3/uL (ref 0.9–3.3)

## 2015-11-07 LAB — FERRITIN CHCC: FERRITIN: 38 ng/mL (ref 22–316)

## 2015-11-07 NOTE — Patient Instructions (Signed)
Therapeutic Phlebotomy, Care After  Refer to this sheet in the next few weeks. These instructions provide you with information about caring for yourself after your procedure. Your health care provider may also give you more specific instructions. Your treatment has been planned according to current medical practices, but problems sometimes occur. Call your health care provider if you have any problems or questions after your procedure.  WHAT TO EXPECT AFTER THE PROCEDURE  After your procedure, it is common to have:   Light-headedness or dizziness. You may feel faint.   Nausea.   Tiredness.  HOME CARE INSTRUCTIONS  Activities   Return to your normal activities as directed by your health care provider. Most people can go back to their normal activities right away.   Avoid strenuous physical activity and heavy lifting or pulling for about 5 hours after the procedure. Do not lift anything that is heavier than 10 lb (4.5 kg).   Athletes should avoid strenuous exercise for at least 12 hours.   Change positions slowly for the remainder of the day. This will help to prevent light-headedness or fainting.   If you feel light-headed, lie down until the feeling goes away.  Eating and Drinking   Be sure to eat well-balanced meals for the next 24 hours.   Drink enough fluid to keep your urine clear or pale yellow.   Avoid drinking alcohol on the day that you had the procedure.  Care of the Needle Insertion Site   Keep your bandage dry. You can remove the bandage after about 5 hours or as directed by your health care provider.   If you have bleeding from the needle insertion site, elevate your arm and press firmly on the site until the bleeding stops.   If you have bruising at the site, apply ice to the area:   Put ice in a plastic bag.   Place a towel between your skin and the bag.   Leave the ice on for 20 minutes, 2-3 times a day for the first 24 hours.   If the swelling does not go away after 24 hours, apply  a warm, moist washcloth to the area for 20 minutes, 2-3 times a day.  General Instructions   Avoid smoking for at least 30 minutes after the procedure.   Keep all follow-up visits as directed by your health care provider. It is important to continue with further therapeutic phlebotomy treatments as directed.  SEEK MEDICAL CARE IF:   You have redness, swelling, or pain at the needle insertion site.   You have fluid, blood, or pus coming from the needle insertion site.   You feel light-headed, dizzy, or nauseated, and the feeling does not go away.   You notice new bruising at the needle insertion site.   You feel weaker than normal.   You have a fever or chills.  SEEK IMMEDIATE MEDICAL CARE IF:   You have severe nausea or vomiting.   You have chest pain.   You have trouble breathing.    This information is not intended to replace advice given to you by your health care provider. Make sure you discuss any questions you have with your health care provider.    Document Released: 04/27/2011 Document Revised: 04/09/2015 Document Reviewed: 11/19/2014  Elsevier Interactive Patient Education 2016 [employer]

## 2015-11-07 NOTE — Progress Notes (Signed)
Pt tolerated first phlebotomy today. No acute distress at this time. Pt and wife provided with nourishment and after care phlebotomy education. Pt remained for the full 30 min observation. VSS stable.

## 2015-11-07 NOTE — Telephone Encounter (Signed)
per pof to sch pt appt-gave pt copy of avs-sent MW emailt o sch phlebotomy

## 2015-11-07 NOTE — Telephone Encounter (Signed)
Per staff message and POF I have scheduled appts. Advised scheduler of appts. JMW  

## 2015-11-07 NOTE — Telephone Encounter (Signed)
lvm for pt regarding to todays appt.....pt ok and aware °

## 2015-11-08 NOTE — Assessment & Plan Note (Signed)
I suspect the patient may have undiagnosed myeloproliferative disorder. This might have caused the pulmonary emboli earlier in the year. I explained to the patient's wife the danger of high hemoglobin. I plan to bring him back on a weekly basis to start phlebotomy, removing one unit of blood each time until his hematocrit is 50 or lower. I have proceeded to order JAK2 mutation on the peripheral blood and if it is confirmed that the patient had myeloproliferative disorder, I will start him on hydroxyurea. I have discussed this extensively with the patient's wife and she agreed with the plan of care.

## 2015-11-08 NOTE — Progress Notes (Signed)
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 He is not able to give an accurate history. The patient was last seen here in July 2016 and is seen again due to recent discovery of high hemoglobin Majority of the history is obtained through his wife. His wife noted that the patient had progressive shortness of breath with minimal exertion. CT angiogram in an outside facility dated 06/24/2015 show evidence of bilateral PE. In addition, recent venous Doppler ultrasound showed extensive right lower extremity DVT. There is positive family history of blood clots. The patient was placed on anticoagulation therapy with plan to continue indefinitely due to what is perceived as an unprovoked blood clot. Comparing to his baseline hemoglobin, it was subsequently noted that the patient have high hemoglobin on 09/26/2015 with hemoglobin high at 19.3 and a hematocrit of 59.4. He was seen here immediately for further evaluation  INTERVAL HISTORY: History is not possible due to his dementia Please see below for problem oriented charting. According to his wife, the patient has been complaining of intermittent headaches. He is doing well on anticoagulation without any major recent bleeding.  REVIEW OF SYSTEMS:   Constitutional: Denies fevers, chills or abnormal weight loss Eyes: Denies blurriness of vision Ears, nose, mouth, throat, and face: Denies mucositis or sore throat Respiratory: Denies cough, dyspnea or wheezes Cardiovascular: Denies palpitation, chest discomfort or lower extremity swelling Gastrointestinal:  Denies nausea, heartburn or change in bowel habits Skin: Denies abnormal skin rashes Lymphatics: Denies new lymphadenopathy or easy bruising Neurological:Denies numbness, tingling or new  weaknesses Behavioral/Psych: Mood is stable, no new changes  All other systems were reviewed with the patient and are negative.  I have reviewed the past medical history, past surgical history, social history and family history with the patient and they are unchanged from previous note.  ALLERGIES:  is allergic to seasonal ic.  MEDICATIONS:  Current Outpatient Prescriptions  Medication Sig Dispense Refill  . apixaban (ELIQUIS) 5 MG TABS tablet Take 1 tablet (5 mg total) by mouth 2 (two) times daily. 60 tablet 1  . B COMPLEX VITAMINS PO Take by mouth 3 (three) times daily.    . folic acid (FOLVITE) 1 MG tablet Take 1 tablet (1 mg total) by mouth daily. 30 tablet 0  . lisinopril (PRINIVIL,ZESTRIL) 10 MG tablet Take 5 mg by mouth daily.     Marland Kitchen loratadine (CLARITIN) 10 MG tablet Take 10 mg by mouth daily.    . Multiple Vitamins-Minerals (MULTIVITAMIN ADULT) TABS Take 1 tablet by mouth daily.    Marland Kitchen thiamine 100 MG tablet Take 1 tablet (100 mg total) by mouth daily. 30 tablet 0  . vitamin B-12 (CYANOCOBALAMIN) 1000 MCG tablet Take 1,000 mcg by mouth daily.     No current facility-administered medications for this visit.    PHYSICAL EXAMINATION: ECOG PERFORMANCE STATUS: 0 - Asymptomatic  Filed Vitals:   11/07/15 1414  BP: 123/81  Pulse: 73  Temp: 98.2 F (36.8 C)  Resp: 20   Filed Weights   11/07/15 1414  Weight: 163 lb 3.2 oz (74.027 kg)    GENERAL:alert, no distress and comfortable SKIN: he appears plethoric EYES: normal, Conjunctiva are pink and non-injected, sclera clear OROPHARYNX:no exudate, no erythema and lips, buccal mucosa, and tongue normal  NECK: supple, thyroid normal size, non-tender, without nodularity LYMPH:  no  palpable lymphadenopathy in the cervical, axillary or inguinal LUNGS: clear to auscultation and percussion with normal breathing effort HEART: regular rate & rhythm and no murmurs and no lower extremity edema ABDOMEN:abdomen soft, non-tender and normal  bowel sounds. No palpable splenomegaly Musculoskeletal:no cyanosis of digits and no clubbing  NEURO: alert , no focal motor/sensory deficits  LABORATORY DATA:  I have reviewed the data as listed    Component Value Date/Time   NA 138 06/24/2015 1800   K 4.0 06/24/2015 1800   CL 101 06/24/2015 1800   CO2 24 06/24/2015 1800   GLUCOSE 96 06/24/2015 1800   BUN 11 06/24/2015 1800   CREATININE 0.96 06/24/2015 1800   CALCIUM 9.8 06/24/2015 1800   PROT 6.7 06/24/2015 1800   ALBUMIN 3.9 06/24/2015 1800   AST 22 06/24/2015 1800   ALT 14* 06/24/2015 1800   ALKPHOS 61 06/24/2015 1800   BILITOT 0.8 06/24/2015 1800   GFRNONAA >60 06/24/2015 1800   GFRAA >60 06/24/2015 1800    No results found for: SPEP, UPEP  Lab Results  Component Value Date   WBC 7.7 11/07/2015   NEUTROABS 5.5 11/07/2015   HGB 19.5* 11/07/2015   HCT 56.9* 11/07/2015   MCV 101.2* 11/07/2015   PLT 169 11/07/2015      Chemistry      Component Value Date/Time   NA 138 06/24/2015 1800   K 4.0 06/24/2015 1800   CL 101 06/24/2015 1800   CO2 24 06/24/2015 1800   BUN 11 06/24/2015 1800   CREATININE 0.96 06/24/2015 1800      Component Value Date/Time   CALCIUM 9.8 06/24/2015 1800   ALKPHOS 61 06/24/2015 1800   AST 22 06/24/2015 1800   ALT 14* 06/24/2015 1800   BILITOT 0.8 06/24/2015 1800      ASSESSMENT & PLAN:  Polycythemia vera ([place]) I suspect the patient may have undiagnosed myeloproliferative disorder. This might have caused the pulmonary emboli earlier in the year. I explained to the patient's wife the danger of high hemoglobin. I plan to bring him back on a weekly basis to start phlebotomy, removing one unit of blood each time until his hematocrit is 50 or lower. I have proceeded to order JAK2 mutation on the peripheral blood and if it is confirmed that the patient had myeloproliferative disorder, I will start him on hydroxyurea. I have discussed this extensively with the patient's wife and she agreed  with the plan of care.  Acute pulmonary embolism He is doing well on anticoagulation therapy. Recommendation is to proceed with treatment indefinitely  Dementia associated with alcoholism The patient had problems with dementia. His caregiver, his wife have consented with the plan of care as outlined above.   Orders Placed This Encounter  Procedures  . CBC with Differential/Platelet    Standing Status: Standing     Number of Occurrences: 9     Standing Expiration Date: 11/06/2016   All questions were answered. The patient knows to call the clinic with any problems, questions or concerns. No barriers to learning was detected. I spent 25 minutes counseling the patient face to face. The total time spent in the appointment was 30 minutes and more than 50% was on counseling and review of test results     [facility], [place], MD 11/08/2015 10:31 AM

## 2015-11-08 NOTE — Assessment & Plan Note (Signed)
He is doing well on anticoagulation therapy. Recommendation is to proceed with treatment indefinitely

## 2015-11-08 NOTE — Assessment & Plan Note (Signed)
The patient had problems with dementia. His caregiver, his wife have consented with the plan of care as outlined above.

## 2015-11-14 ENCOUNTER — Telehealth: Payer: Self-pay | Admitting: Hematology and Oncology

## 2015-11-14 ENCOUNTER — Encounter: Payer: Self-pay | Admitting: Hematology and Oncology

## 2015-11-14 ENCOUNTER — Other Ambulatory Visit (HOSPITAL_BASED_OUTPATIENT_CLINIC_OR_DEPARTMENT_OTHER): Payer: Medicare Other

## 2015-11-14 ENCOUNTER — Ambulatory Visit (HOSPITAL_BASED_OUTPATIENT_CLINIC_OR_DEPARTMENT_OTHER): Payer: Medicare Other

## 2015-11-14 ENCOUNTER — Ambulatory Visit (HOSPITAL_BASED_OUTPATIENT_CLINIC_OR_DEPARTMENT_OTHER): Payer: Medicare Other | Admitting: Hematology and Oncology

## 2015-11-14 VITALS — BP 133/72 | HR 62 | Temp 97.6°F | Resp 18 | Ht 69.0 in | Wt 165.4 lb

## 2015-11-14 VITALS — BP 112/70 | HR 66 | Temp 97.4°F | Resp 18

## 2015-11-14 DIAGNOSIS — D45 Polycythemia vera: Secondary | ICD-10-CM | POA: Diagnosis not present

## 2015-11-14 DIAGNOSIS — I2699 Other pulmonary embolism without acute cor pulmonale: Secondary | ICD-10-CM

## 2015-11-14 DIAGNOSIS — Z7901 Long term (current) use of anticoagulants: Secondary | ICD-10-CM

## 2015-11-14 DIAGNOSIS — F0390 Unspecified dementia without behavioral disturbance: Secondary | ICD-10-CM

## 2015-11-14 LAB — CBC WITH DIFFERENTIAL/PLATELET
BASO%: 0.5 % (ref 0.0–2.0)
BASOS ABS: 0 10*3/uL (ref 0.0–0.1)
EOS%: 0.6 % (ref 0.0–7.0)
Eosinophils Absolute: 0 10*3/uL (ref 0.0–0.5)
HEMATOCRIT: 53 % — AB (ref 38.4–49.9)
HGB: 17.7 g/dL — ABNORMAL HIGH (ref 13.0–17.1)
LYMPH#: 1.6 10*3/uL (ref 0.9–3.3)
LYMPH%: 24.8 % (ref 14.0–49.0)
MCH: 34 pg — AB (ref 27.2–33.4)
MCHC: 33.4 g/dL (ref 32.0–36.0)
MCV: 101.8 fL — ABNORMAL HIGH (ref 79.3–98.0)
MONO#: 0.5 10*3/uL (ref 0.1–0.9)
MONO%: 8.2 % (ref 0.0–14.0)
NEUT#: 4.2 10*3/uL (ref 1.5–6.5)
NEUT%: 65.9 % (ref 39.0–75.0)
PLATELETS: 200 10*3/uL (ref 140–400)
RBC: 5.2 10*6/uL (ref 4.20–5.82)
RDW: 14.2 % (ref 11.0–14.6)
WBC: 6.4 10*3/uL (ref 4.0–10.3)

## 2015-11-14 NOTE — Telephone Encounter (Signed)
GAVE ADN PRINTED APPT SCHED ANDA VS FOR PT FOR jAN THRU mAY

## 2015-11-14 NOTE — Patient Instructions (Signed)
Therapeutic Phlebotomy, Care After  Refer to this sheet in the next few weeks. These instructions provide you with information about caring for yourself after your procedure. Your health care provider may also give you more specific instructions. Your treatment has been planned according to current medical practices, but problems sometimes occur. Call your health care provider if you have any problems or questions after your procedure.  WHAT TO EXPECT AFTER THE PROCEDURE  After your procedure, it is common to have:   Light-headedness or dizziness. You may feel faint.   Nausea.   Tiredness.  HOME CARE INSTRUCTIONS  Activities   Return to your normal activities as directed by your health care provider. Most people can go back to their normal activities right away.   Avoid strenuous physical activity and heavy lifting or pulling for about 5 hours after the procedure. Do not lift anything that is heavier than 10 lb (4.5 kg).   Athletes should avoid strenuous exercise for at least 12 hours.   Change positions slowly for the remainder of the day. This will help to prevent light-headedness or fainting.   If you feel light-headed, lie down until the feeling goes away.  Eating and Drinking   Be sure to eat well-balanced meals for the next 24 hours.   Drink enough fluid to keep your urine clear or pale yellow.   Avoid drinking alcohol on the day that you had the procedure.  Care of the Needle Insertion Site   Keep your bandage dry. You can remove the bandage after about 5 hours or as directed by your health care provider.   If you have bleeding from the needle insertion site, elevate your arm and press firmly on the site until the bleeding stops.   If you have bruising at the site, apply ice to the area:   Put ice in a plastic bag.   Place a towel between your skin and the bag.   Leave the ice on for 20 minutes, 2-3 times a day for the first 24 hours.   If the swelling does not go away after 24 hours, apply  a warm, moist washcloth to the area for 20 minutes, 2-3 times a day.  General Instructions   Avoid smoking for at least 30 minutes after the procedure.   Keep all follow-up visits as directed by your health care provider. It is important to continue with further therapeutic phlebotomy treatments as directed.  SEEK MEDICAL CARE IF:   You have redness, swelling, or pain at the needle insertion site.   You have fluid, blood, or pus coming from the needle insertion site.   You feel light-headed, dizzy, or nauseated, and the feeling does not go away.   You notice new bruising at the needle insertion site.   You feel weaker than normal.   You have a fever or chills.  SEEK IMMEDIATE MEDICAL CARE IF:   You have severe nausea or vomiting.   You have chest pain.   You have trouble breathing.    This information is not intended to replace advice given to you by your health care provider. Make sure you discuss any questions you have with your health care provider.    Document Released: 04/27/2011 Document Revised: 04/09/2015 Document Reviewed: 11/19/2014  Elsevier Interactive Patient Education 2016 [employer]

## 2015-11-14 NOTE — Progress Notes (Signed)
252 grams removed from right ac for phlebotomy over 10 minutes prior to site clotting off.  Pressure dressing applied and 18 g cath phlebotomy to left ac per Melburn Hake RN done without difficulty for additional 260 grams over 5 minutes.  Pressure dressing applied at completion of phlebotomy.  Snack and drink taken per pt prior to discharge.  Post phlebotomy discharge instructions reviewed with pt and his wife, they have no questions or concerns at this time.

## 2015-11-15 NOTE — Assessment & Plan Note (Signed)
He is doing well on anticoagulation therapy. Recommendation is to proceed with treatment indefinitely

## 2015-11-15 NOTE — Assessment & Plan Note (Signed)
I have extensive discussion with his wife. The patient has dementia. We discussed the risks and benefits of bone marrow biopsy. Even thought the peripheral blood for JAK 2 mutation is negative, he could still have myeloproliferative disorder and that could only be diagnosed with a bone marrow biopsy. Alternatively, we will continue monthly phlebotomy program to keep hematocrit under 48 or hemoglobin under 16 and to remain on anticoagulation therapy indefinitely. After long discussion, she agree with monthly phlebotomy program only and not to pursue bone marrow biopsy for now.

## 2015-11-15 NOTE — Progress Notes (Signed)
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 He is not able to give an accurate history. The patient was last seen here in July 2016 and is seen again due to recent discovery of high hemoglobin Majority of the history is obtained through his wife. His wife noted that the patient had progressive shortness of breath with minimal exertion. CT angiogram in an outside facility dated 06/24/2015 show evidence of bilateral PE. In addition, recent venous Doppler ultrasound showed extensive right lower extremity DVT. There is positive family history of blood clots. The patient was placed on anticoagulation therapy with plan to continue indefinitely due to what is perceived as an unprovoked blood clot. Comparing to his baseline hemoglobin, it was subsequently noted that the patient have high hemoglobin on 09/26/2015 with hemoglobin high at 19.3 and a hematocrit of 59.4. 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 He tolerated phlebotomy well. The patient denies any recent signs or symptoms of bleeding such as spontaneous epistaxis, hematuria or hematochezia.   I have reviewed the past medical history, past surgical history, social history and family history with the patient and they are unchanged from previous note.  ALLERGIES:  is allergic to seasonal ic.  MEDICATIONS:  Current Outpatient Prescriptions  Medication Sig Dispense Refill  . apixaban (ELIQUIS) 5 MG TABS tablet Take 1 tablet (5 mg total) by mouth 2 (two) times daily. 60 tablet 1  . B COMPLEX VITAMINS PO Take by mouth 3 (three) times daily.    . folic acid (FOLVITE) 1 MG tablet Take 1 tablet (1 mg total) by mouth daily. 30 tablet 0  .  lisinopril (PRINIVIL,ZESTRIL) 10 MG tablet Take 5 mg by mouth daily.     Marland Kitchen loratadine (CLARITIN) 10 MG tablet Take 10 mg by mouth daily.    . Multiple Vitamins-Minerals (MULTIVITAMIN ADULT) TABS Take 1 tablet by mouth daily.    Marland Kitchen thiamine 100 MG tablet Take 1 tablet (100 mg total) by mouth daily. 30 tablet 0  . vitamin B-12 (CYANOCOBALAMIN) 1000 MCG tablet Take 1,000 mcg by mouth daily.     No current facility-administered medications for this visit.     REVIEW OF SYSTEMS:   Constitutional: Denies fevers, chills or night sweats Eyes: Denies blurriness of vision Ears, nose, mouth, throat, and face: Denies mucositis or sore throat Respiratory: Denies cough, dyspnea or wheezes Cardiovascular: Denies palpitation, chest discomfort or lower extremity swelling Gastrointestinal:  Denies nausea, heartburn or change in bowel habits Skin: Denies abnormal skin rashes Lymphatics: Denies new lymphadenopathy or easy bruising Neurological:Denies numbness, tingling or new weaknesses Behavioral/Psych: Mood is stable, no new changes  All other systems were reviewed with the patient and are negative.  PHYSICAL EXAMINATION: ECOG PERFORMANCE STATUS: 0 - Asymptomatic  Filed Vitals:   11/14/15 1416  BP: 133/72  Pulse: 62  Temp: 97.6 F (36.4 C)  Resp: 18   Filed Weights   11/14/15 1416  Weight: 165 lb 6.4 oz (75.025 kg)    GENERAL:alert, no distress and comfortable SKIN: skin color, texture, turgor are normal, no rashes or significant lesions EYES: normal, Conjunctiva are pink and non-injected, sclera clear OROPHARYNX:no exudate, no erythema and lips, buccal mucosa, and tongue normal  NECK: supple, thyroid normal size, non-tender, without nodularity LYMPH:  no palpable lymphadenopathy in the cervical, axillary or inguinal LUNGS: clear to auscultation and percussion with normal breathing effort HEART: regular rate & rhythm and no murmurs and no lower extremity edema ABDOMEN:abdomen soft,  non-tender and normal bowel sounds Musculoskeletal:no cyanosis of digits and no clubbing  NEURO: alert & oriented x 3 with fluent speech, no focal motor/sensory deficits  LABORATORY DATA:  I have reviewed the data as listed Results for orders placed or performed in visit on 11/14/15 (from the past 48 hour(s))  CBC with Differential/Platelet     Status: Abnormal   Collection Time: 11/14/15  2:05 PM  Result Value Ref Range   WBC 6.4 4.0 - 10.3 10e3/uL   NEUT# 4.2 1.5 - 6.5 10e3/uL   HGB 17.7 (H) 13.0 - 17.1 g/dL   HCT 53.0 (H) 38.4 - 49.9 %   Platelets 200 140 - 400 10e3/uL   MCV 101.8 (H) 79.3 - 98.0 fL   MCH 34.0 (H) 27.2 - 33.4 pg   MCHC 33.4 32.0 - 36.0 g/dL   RBC 5.20 4.20 - 5.82 10e6/uL   RDW 14.2 11.0 - 14.6 %   lymph# 1.6 0.9 - 3.3 10e3/uL   MONO# 0.5 0.1 - 0.9 10e3/uL   Eosinophils Absolute 0.0 0.0 - 0.5 10e3/uL   Basophils Absolute 0.0 0.0 - 0.1 10e3/uL   NEUT% 65.9 39.0 - 75.0 %   LYMPH% 24.8 14.0 - 49.0 %   MONO% 8.2 0.0 - 14.0 %   EOS% 0.6 0.0 - 7.0 %   BASO% 0.5 0.0 - 2.0 %    Lab Results  Component Value Date   WBC 6.4 11/14/2015   HGB 17.7* 11/14/2015   HCT 53.0* 11/14/2015   MCV 101.8* 11/14/2015   PLT 200 11/14/2015   ASSESSMENT & PLAN:  Acute pulmonary embolism He is doing well on anticoagulation therapy. Recommendation is to proceed with treatment indefinitely    Polycythemia vera ([place]) I have extensive discussion with his wife. The patient has dementia. We discussed the risks and benefits of bone marrow biopsy. Even thought the peripheral blood for JAK 2 mutation is negative, he could still have myeloproliferative disorder and that could only be diagnosed with a bone marrow biopsy. Alternatively, we will continue monthly phlebotomy program to keep hematocrit under 48 or hemoglobin under 16 and to remain on anticoagulation therapy indefinitely. After long discussion, she agree with monthly phlebotomy program only and not to pursue bone marrow  biopsy for now.   All questions were answered. The patient knows to call the clinic with any problems, questions or concerns. No barriers to learning was detected.  I spent 15 minutes counseling the patient face to face. The total time spent in the appointment was 20 minutes and more than 50% was on counseling.     Aveah Castell, MD 12/9/20169:18 AM

## 2015-11-21 ENCOUNTER — Other Ambulatory Visit: Payer: Medicare Other

## 2015-11-28 ENCOUNTER — Other Ambulatory Visit: Payer: Medicare Other

## 2015-12-12 ENCOUNTER — Other Ambulatory Visit (HOSPITAL_BASED_OUTPATIENT_CLINIC_OR_DEPARTMENT_OTHER): Payer: Medicare Other

## 2015-12-12 ENCOUNTER — Ambulatory Visit (HOSPITAL_BASED_OUTPATIENT_CLINIC_OR_DEPARTMENT_OTHER): Payer: Medicare Other

## 2015-12-12 VITALS — BP 128/68 | HR 68 | Temp 97.7°F | Resp 18

## 2015-12-12 DIAGNOSIS — D45 Polycythemia vera: Secondary | ICD-10-CM | POA: Diagnosis not present

## 2015-12-12 LAB — CBC WITH DIFFERENTIAL/PLATELET
BASO%: 0.5 % (ref 0.0–2.0)
Basophils Absolute: 0 10*3/uL (ref 0.0–0.1)
EOS%: 0.7 % (ref 0.0–7.0)
Eosinophils Absolute: 0.1 10*3/uL (ref 0.0–0.5)
HCT: 54.1 % — ABNORMAL HIGH (ref 38.4–49.9)
HEMOGLOBIN: 17.7 g/dL — AB (ref 13.0–17.1)
LYMPH%: 20.6 % (ref 14.0–49.0)
MCH: 33.5 pg — AB (ref 27.2–33.4)
MCHC: 32.8 g/dL (ref 32.0–36.0)
MCV: 102.1 fL — ABNORMAL HIGH (ref 79.3–98.0)
MONO#: 0.7 10*3/uL (ref 0.1–0.9)
MONO%: 8.6 % (ref 0.0–14.0)
NEUT%: 69.6 % (ref 39.0–75.0)
NEUTROS ABS: 5.5 10*3/uL (ref 1.5–6.5)
Platelets: 186 10*3/uL (ref 140–400)
RBC: 5.29 10*6/uL (ref 4.20–5.82)
RDW: 14.5 % (ref 11.0–14.6)
WBC: 7.9 10*3/uL (ref 4.0–10.3)
lymph#: 1.6 10*3/uL (ref 0.9–3.3)

## 2015-12-12 NOTE — Progress Notes (Signed)
Pt phlebotomy performed within 20 minutes. Using 18g needle. Filled bad to 437mls. Denies any dizziness at this time. Pt provided nutrition and hydration during 30 min post phlebotomy treatment. AVS printed out

## 2015-12-12 NOTE — Patient Instructions (Signed)
Therapeutic Phlebotomy, Care After  Refer to this sheet in the next few weeks. These instructions provide you with information about caring for yourself after your procedure. Your health care provider may also give you more specific instructions. Your treatment has been planned according to current medical practices, but problems sometimes occur. Call your health care provider if you have any problems or questions after your procedure.  WHAT TO EXPECT AFTER THE PROCEDURE  After your procedure, it is common to have:   Light-headedness or dizziness. You may feel faint.   Nausea.   Tiredness.  HOME CARE INSTRUCTIONS  Activities   Return to your normal activities as directed by your health care provider. Most people can go back to their normal activities right away.   Avoid strenuous physical activity and heavy lifting or pulling for about 5 hours after the procedure. Do not lift anything that is heavier than 10 lb (4.5 kg).   Athletes should avoid strenuous exercise for at least 12 hours.   Change positions slowly for the remainder of the day. This will help to prevent light-headedness or fainting.   If you feel light-headed, lie down until the feeling goes away.  Eating and Drinking   Be sure to eat well-balanced meals for the next 24 hours.   Drink enough fluid to keep your urine clear or pale yellow.   Avoid drinking alcohol on the day that you had the procedure.  Care of the Needle Insertion Site   Keep your bandage dry. You can remove the bandage after about 5 hours or as directed by your health care provider.   If you have bleeding from the needle insertion site, elevate your arm and press firmly on the site until the bleeding stops.   If you have bruising at the site, apply ice to the area:   Put ice in a plastic bag.   Place a towel between your skin and the bag.   Leave the ice on for 20 minutes, 2-3 times a day for the first 24 hours.   If the swelling does not go away after 24 hours, apply  a warm, moist washcloth to the area for 20 minutes, 2-3 times a day.  General Instructions   Avoid smoking for at least 30 minutes after the procedure.   Keep all follow-up visits as directed by your health care provider. It is important to continue with further therapeutic phlebotomy treatments as directed.  SEEK MEDICAL CARE IF:   You have redness, swelling, or pain at the needle insertion site.   You have fluid, blood, or pus coming from the needle insertion site.   You feel light-headed, dizzy, or nauseated, and the feeling does not go away.   You notice new bruising at the needle insertion site.   You feel weaker than normal.   You have a fever or chills.  SEEK IMMEDIATE MEDICAL CARE IF:   You have severe nausea or vomiting.   You have chest pain.   You have trouble breathing.    This information is not intended to replace advice given to you by your health care provider. Make sure you discuss any questions you have with your health care provider.    Document Released: 04/27/2011 Document Revised: 04/09/2015 Document Reviewed: 11/19/2014  Elsevier Interactive Patient Education 2016 [employer]

## 2016-01-09 ENCOUNTER — Ambulatory Visit (HOSPITAL_BASED_OUTPATIENT_CLINIC_OR_DEPARTMENT_OTHER): Payer: Medicare Other

## 2016-01-09 ENCOUNTER — Other Ambulatory Visit (HOSPITAL_BASED_OUTPATIENT_CLINIC_OR_DEPARTMENT_OTHER): Payer: Medicare Other

## 2016-01-09 DIAGNOSIS — D45 Polycythemia vera: Secondary | ICD-10-CM

## 2016-01-09 LAB — CBC WITH DIFFERENTIAL/PLATELET
BASO%: 0.4 % (ref 0.0–2.0)
BASOS ABS: 0 10*3/uL (ref 0.0–0.1)
EOS ABS: 0.1 10*3/uL (ref 0.0–0.5)
EOS%: 1 % (ref 0.0–7.0)
HCT: 51.1 % — ABNORMAL HIGH (ref 38.4–49.9)
HEMOGLOBIN: 17.3 g/dL — AB (ref 13.0–17.1)
LYMPH%: 33 % (ref 14.0–49.0)
MCH: 34.4 pg — AB (ref 27.2–33.4)
MCHC: 33.9 g/dL (ref 32.0–36.0)
MCV: 101.6 fL — AB (ref 79.3–98.0)
MONO#: 0.5 10*3/uL (ref 0.1–0.9)
MONO%: 7.7 % (ref 0.0–14.0)
NEUT#: 3.9 10*3/uL (ref 1.5–6.5)
NEUT%: 57.9 % (ref 39.0–75.0)
Platelets: 180 10*3/uL (ref 140–400)
RBC: 5.03 10*6/uL (ref 4.20–5.82)
RDW: 13.9 % (ref 11.0–14.6)
WBC: 6.8 10*3/uL (ref 4.0–10.3)
lymph#: 2.2 10*3/uL (ref 0.9–3.3)

## 2016-01-09 NOTE — Patient Instructions (Signed)

## 2016-01-09 NOTE — Progress Notes (Signed)
Patient had a therapeutic phlebotomy using 16 g needle x's two attempts.  One stick in each arm.  Obtained 440 ml total.

## 2016-02-06 ENCOUNTER — Ambulatory Visit (HOSPITAL_BASED_OUTPATIENT_CLINIC_OR_DEPARTMENT_OTHER): Payer: Medicare Other

## 2016-02-06 ENCOUNTER — Other Ambulatory Visit (HOSPITAL_BASED_OUTPATIENT_CLINIC_OR_DEPARTMENT_OTHER): Payer: Medicare Other

## 2016-02-06 VITALS — BP 121/71 | HR 86 | Temp 98.0°F | Resp 18

## 2016-02-06 DIAGNOSIS — D45 Polycythemia vera: Secondary | ICD-10-CM

## 2016-02-06 LAB — CBC WITH DIFFERENTIAL/PLATELET
BASO%: 0.3 % (ref 0.0–2.0)
BASOS ABS: 0 10*3/uL (ref 0.0–0.1)
EOS ABS: 0.1 10*3/uL (ref 0.0–0.5)
EOS%: 0.9 % (ref 0.0–7.0)
HEMATOCRIT: 50.6 % — AB (ref 38.4–49.9)
HGB: 17.2 g/dL — ABNORMAL HIGH (ref 13.0–17.1)
LYMPH#: 2 10*3/uL (ref 0.9–3.3)
LYMPH%: 29.8 % (ref 14.0–49.0)
MCH: 34.3 pg — AB (ref 27.2–33.4)
MCHC: 34 g/dL (ref 32.0–36.0)
MCV: 101 fL — AB (ref 79.3–98.0)
MONO#: 0.7 10*3/uL (ref 0.1–0.9)
MONO%: 10 % (ref 0.0–14.0)
NEUT#: 3.9 10*3/uL (ref 1.5–6.5)
NEUT%: 59 % (ref 39.0–75.0)
PLATELETS: 180 10*3/uL (ref 140–400)
RBC: 5.01 10*6/uL (ref 4.20–5.82)
RDW: 13.6 % (ref 11.0–14.6)
WBC: 6.7 10*3/uL (ref 4.0–10.3)

## 2016-02-06 NOTE — Patient Instructions (Signed)
Therapeutic Phlebotomy, Care After  Refer to this sheet in the next few weeks. These instructions provide you with information about caring for yourself after your procedure. Your health care provider may also give you more specific instructions. Your treatment has been planned according to current medical practices, but problems sometimes occur. Call your health care provider if you have any problems or questions after your procedure.  WHAT TO EXPECT AFTER THE PROCEDURE  After your procedure, it is common to have:   Light-headedness or dizziness. You may feel faint.   Nausea.   Tiredness.  HOME CARE INSTRUCTIONS  Activities   Return to your normal activities as directed by your health care provider. Most people can go back to their normal activities right away.   Avoid strenuous physical activity and heavy lifting or pulling for about 5 hours after the procedure. Do not lift anything that is heavier than 10 lb (4.5 kg).   Athletes should avoid strenuous exercise for at least 12 hours.   Change positions slowly for the remainder of the day. This will help to prevent light-headedness or fainting.   If you feel light-headed, lie down until the feeling goes away.  Eating and Drinking   Be sure to eat well-balanced meals for the next 24 hours.   Drink enough fluid to keep your urine clear or pale yellow.   Avoid drinking alcohol on the day that you had the procedure.  Care of the Needle Insertion Site   Keep your bandage dry. You can remove the bandage after about 5 hours or as directed by your health care provider.   If you have bleeding from the needle insertion site, elevate your arm and press firmly on the site until the bleeding stops.   If you have bruising at the site, apply ice to the area:   Put ice in a plastic bag.   Place a towel between your skin and the bag.   Leave the ice on for 20 minutes, 2-3 times a day for the first 24 hours.   If the swelling does not go away after 24 hours, apply  a warm, moist washcloth to the area for 20 minutes, 2-3 times a day.  General Instructions   Avoid smoking for at least 30 minutes after the procedure.   Keep all follow-up visits as directed by your health care provider. It is important to continue with further therapeutic phlebotomy treatments as directed.  SEEK MEDICAL CARE IF:   You have redness, swelling, or pain at the needle insertion site.   You have fluid, blood, or pus coming from the needle insertion site.   You feel light-headed, dizzy, or nauseated, and the feeling does not go away.   You notice new bruising at the needle insertion site.   You feel weaker than normal.   You have a fever or chills.  SEEK IMMEDIATE MEDICAL CARE IF:   You have severe nausea or vomiting.   You have chest pain.   You have trouble breathing.    This information is not intended to replace advice given to you by your health care provider. Make sure you discuss any questions you have with your health care provider.    Document Released: 04/27/2011 Document Revised: 04/09/2015 Document Reviewed: 11/19/2014  Elsevier Interactive Patient Education 2016 [employer]

## 2016-02-06 NOTE — Progress Notes (Signed)
Per Dr. Ernestina Penna hematocrit parameters, hct 50.6%, performed phlebotomy treatment with 18g needle on RAC starting [id number]. Pt tolerated w/o any issues. Pt given food to eat and drink prior to phlebotomy. He states that he did not eat anything since breakfast today. Asymptomatic upon starting and finishing treatment.  Pt to stay for 24min post phlebotomy observation. Son at pt side at this time. No reports of dizziness or lightheadedness post phlebotomy.  1620- Pt VSS stable and denies any complaints at this time. Hydration and nutrition provided. AVS printed and given to pt.

## 2016-03-12 ENCOUNTER — Other Ambulatory Visit (HOSPITAL_BASED_OUTPATIENT_CLINIC_OR_DEPARTMENT_OTHER): Payer: Medicare Other

## 2016-03-12 ENCOUNTER — Ambulatory Visit (HOSPITAL_BASED_OUTPATIENT_CLINIC_OR_DEPARTMENT_OTHER): Payer: Medicare Other

## 2016-03-12 DIAGNOSIS — D45 Polycythemia vera: Secondary | ICD-10-CM

## 2016-03-12 LAB — CBC WITH DIFFERENTIAL/PLATELET
BASO%: 0.5 % (ref 0.0–2.0)
Basophils Absolute: 0 10*3/uL (ref 0.0–0.1)
EOS%: 0.6 % (ref 0.0–7.0)
Eosinophils Absolute: 0.1 10*3/uL (ref 0.0–0.5)
HCT: 49.9 % (ref 38.4–49.9)
HEMOGLOBIN: 16.5 g/dL (ref 13.0–17.1)
LYMPH%: 18.7 % (ref 14.0–49.0)
MCH: 33.1 pg (ref 27.2–33.4)
MCHC: 33.1 g/dL (ref 32.0–36.0)
MCV: 99.8 fL — ABNORMAL HIGH (ref 79.3–98.0)
MONO#: 0.7 10*3/uL (ref 0.1–0.9)
MONO%: 8.6 % (ref 0.0–14.0)
NEUT%: 71.6 % (ref 39.0–75.0)
NEUTROS ABS: 6 10*3/uL (ref 1.5–6.5)
Platelets: 198 10*3/uL (ref 140–400)
RBC: 4.99 10*6/uL (ref 4.20–5.82)
RDW: 13.9 % (ref 11.0–14.6)
WBC: 8.3 10*3/uL (ref 4.0–10.3)
lymph#: 1.6 10*3/uL (ref 0.9–3.3)

## 2016-03-12 NOTE — Progress Notes (Signed)
Therapeutic phlebotomy performed as ordered. Patient tolerated well. Will be discharged with son.

## 2016-03-12 NOTE — Patient Instructions (Signed)

## 2016-03-20 ENCOUNTER — Telehealth: Payer: Self-pay | Admitting: Hematology and Oncology

## 2016-03-20 NOTE — Telephone Encounter (Signed)
s.w. pt wife and advised on 5.11 appt cx and moved to 5.16 per MD request....pt ok and aware of new d.t

## 2016-04-16 ENCOUNTER — Other Ambulatory Visit: Payer: Medicare Other

## 2016-04-16 ENCOUNTER — Ambulatory Visit: Payer: Medicare Other | Admitting: Hematology and Oncology

## 2016-04-21 ENCOUNTER — Telehealth: Payer: Self-pay | Admitting: Hematology and Oncology

## 2016-04-21 ENCOUNTER — Other Ambulatory Visit (HOSPITAL_BASED_OUTPATIENT_CLINIC_OR_DEPARTMENT_OTHER): Payer: Medicare Other

## 2016-04-21 ENCOUNTER — Encounter: Payer: Self-pay | Admitting: Hematology and Oncology

## 2016-04-21 ENCOUNTER — Telehealth: Payer: Self-pay | Admitting: *Deleted

## 2016-04-21 ENCOUNTER — Ambulatory Visit (HOSPITAL_BASED_OUTPATIENT_CLINIC_OR_DEPARTMENT_OTHER): Payer: Medicare Other | Admitting: Hematology and Oncology

## 2016-04-21 VITALS — BP 128/78 | HR 59 | Temp 97.5°F | Resp 18 | Ht 69.0 in | Wt 165.8 lb

## 2016-04-21 DIAGNOSIS — D45 Polycythemia vera: Secondary | ICD-10-CM

## 2016-04-21 DIAGNOSIS — I2699 Other pulmonary embolism without acute cor pulmonale: Secondary | ICD-10-CM | POA: Diagnosis not present

## 2016-04-21 DIAGNOSIS — F0390 Unspecified dementia without behavioral disturbance: Secondary | ICD-10-CM

## 2016-04-21 LAB — CBC WITH DIFFERENTIAL/PLATELET
BASO%: 0.4 % (ref 0.0–2.0)
Basophils Absolute: 0 10*3/uL (ref 0.0–0.1)
EOS ABS: 0.2 10*3/uL (ref 0.0–0.5)
EOS%: 2.9 % (ref 0.0–7.0)
HCT: 46.1 % (ref 38.4–49.9)
HGB: 15.5 g/dL (ref 13.0–17.1)
LYMPH%: 27.5 % (ref 14.0–49.0)
MCH: 33.3 pg (ref 27.2–33.4)
MCHC: 33.6 g/dL (ref 32.0–36.0)
MCV: 99.1 fL — AB (ref 79.3–98.0)
MONO#: 0.8 10*3/uL (ref 0.1–0.9)
MONO%: 9.5 % (ref 0.0–14.0)
NEUT%: 59.7 % (ref 39.0–75.0)
NEUTROS ABS: 4.8 10*3/uL (ref 1.5–6.5)
PLATELETS: 187 10*3/uL (ref 140–400)
RBC: 4.65 10*6/uL (ref 4.20–5.82)
RDW: 14.8 % — ABNORMAL HIGH (ref 11.0–14.6)
WBC: 8 10*3/uL (ref 4.0–10.3)
lymph#: 2.2 10*3/uL (ref 0.9–3.3)

## 2016-04-21 NOTE — Telephone Encounter (Signed)
Gave pt apt & avs °

## 2016-04-21 NOTE — Telephone Encounter (Signed)
Per staff message and POF I have scheduled appts. Advised scheduler of appts. JMW  

## 2016-04-22 NOTE — Assessment & Plan Note (Signed)
I have extensive discussion with his wife. The patient has dementia. We discussed the risks and benefits of bone marrow biopsy. Even thought the peripheral blood for JAK 2 mutation is negative, he could still have myeloproliferative disorder and that could only be diagnosed with a bone marrow biopsy. Alternatively, we will continue monthly phlebotomy program to keep hematocrit under 48 or hemoglobin under 16 and to remain on anticoagulation therapy indefinitely. After long discussion, she agree with monthly phlebotomy program only and not to pursue bone marrow biopsy for now. He had responded well to monthly phlebotomy. He does not need it today I plan to see him back in 3 months.

## 2016-04-22 NOTE — Assessment & Plan Note (Signed)
He is doing well on anticoagulation therapy. We discussed the risks and benefits of continuing anticoagulation therapy versus stopping. He has family history of clotting disorder and possible undiagnosed myeloproliferative disorder that would place him at very high risks of recurrence of blood clot if we stop Rx. Recommendation is to proceed with treatment indefinitely

## 2016-04-22 NOTE — Progress Notes (Signed)
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 He is not able to give an accurate history. The patient was last seen here in July 2016 and is seen again due to recent discovery of high hemoglobin Majority of the history is obtained through his wife. His wife noted that the patient had progressive shortness of breath with minimal exertion. CT angiogram in an outside facility dated 06/24/2015 show evidence of bilateral PE. In addition, recent venous Doppler ultrasound showed extensive right lower extremity DVT. There is positive family history of blood clots. The patient was placed on anticoagulation therapy with plan to continue indefinitely due to what is perceived as an unprovoked blood clot. Comparing to his baseline hemoglobin, it was subsequently noted that the patient have high hemoglobin on 09/26/2015 with hemoglobin high at 19.3 and a hematocrit of 59.4. He was seen here immediately for further evaluation Peripheral blood for JAK2 mutation on 11/07/2015 was negative. He received monthly phlebotomy from December 2016 to May 2017.  INTERVAL HISTORY: John [name] 65 y.o. male returns for further follow-up. He tolerated phlebotomy well. The patient denies any recent signs or symptoms of bleeding such as spontaneous epistaxis, hematuria or hematochezia. I have reviewed the past medical history, past surgical history, social history and family history with the patient and they are unchanged from previous note.  ALLERGIES:  has no active allergies.  MEDICATIONS:  Current Outpatient Prescriptions  Medication Sig Dispense Refill  . apixaban (ELIQUIS) 5 MG TABS tablet Take 1 tablet (5 mg total) by mouth 2 (two) times daily. 60 tablet 1  . B COMPLEX VITAMINS PO Take by mouth 3 (three) times daily.    . folic acid (FOLVITE) 1 MG tablet Take  1 tablet (1 mg total) by mouth daily. 30 tablet 0  . lisinopril (PRINIVIL,ZESTRIL) 10 MG tablet Take 5 mg by mouth daily.     Marland Kitchen loratadine (CLARITIN) 10 MG tablet Take 10 mg by mouth daily.    . Multiple Vitamins-Minerals (MULTIVITAMIN ADULT) TABS Take 1 tablet by mouth daily.    Marland Kitchen thiamine 100 MG tablet Take 1 tablet (100 mg total) by mouth daily. 30 tablet 0  . vitamin B-12 (CYANOCOBALAMIN) 1000 MCG tablet Take 1,000 mcg by mouth daily.     No current facility-administered medications for this visit.     REVIEW OF SYSTEMS:   Constitutional: Denies fevers, chills or night sweats Eyes: Denies blurriness of vision Ears, nose, mouth, throat, and face: Denies mucositis or sore throat Respiratory: Denies cough, dyspnea or wheezes Cardiovascular: Denies palpitation, chest discomfort or lower extremity swelling Gastrointestinal:  Denies nausea, heartburn or change in bowel habits Skin: Denies abnormal skin rashes Lymphatics: Denies new lymphadenopathy or easy bruising Neurological:Denies numbness, tingling or new weaknesses Behavioral/Psych: Mood is stable, no new changes  All other systems were reviewed with the patient and are negative.  PHYSICAL EXAMINATION: ECOG PERFORMANCE STATUS: 0 - Asymptomatic  Filed Vitals:   04/21/16 1559  BP: 128/78  Pulse: 59  Temp: 97.5 F (36.4 C)  Resp: 18   Filed Weights   04/21/16 1559  Weight: 165 lb 12.8 oz (75.206 kg)    GENERAL:alert, no distress and comfortable SKIN: skin color, texture, turgor are normal, no rashes or significant lesions EYES: normal, Conjunctiva are pink and non-injected, sclera clear Musculoskeletal:no cyanosis of digits and no clubbing  NEURO: alert & oriented x 3 with  fluent speech, no focal motor/sensory deficits  LABORATORY DATA:  I have reviewed the data as listed     Component Value Date/Time   NA 138 06/24/2015 1800   K 4.0 06/24/2015 1800   CL 101 06/24/2015 1800   CO2 24 06/24/2015 1800   GLUCOSE 96  06/24/2015 1800   BUN 11 06/24/2015 1800   CREATININE 0.96 06/24/2015 1800   CALCIUM 9.8 06/24/2015 1800   PROT 6.7 06/24/2015 1800   ALBUMIN 3.9 06/24/2015 1800   AST 22 06/24/2015 1800   ALT 14* 06/24/2015 1800   ALKPHOS 61 06/24/2015 1800   BILITOT 0.8 06/24/2015 1800   GFRNONAA >60 06/24/2015 1800   GFRAA >60 06/24/2015 1800    No results found for: SPEP, UPEP  Lab Results  Component Value Date   WBC 8.0 04/21/2016   NEUTROABS 4.8 04/21/2016   HGB 15.5 04/21/2016   HCT 46.1 04/21/2016   MCV 99.1* 04/21/2016   PLT 187 04/21/2016      Chemistry      Component Value Date/Time   NA 138 06/24/2015 1800   K 4.0 06/24/2015 1800   CL 101 06/24/2015 1800   CO2 24 06/24/2015 1800   BUN 11 06/24/2015 1800   CREATININE 0.96 06/24/2015 1800      Component Value Date/Time   CALCIUM 9.8 06/24/2015 1800   ALKPHOS 61 06/24/2015 1800   AST 22 06/24/2015 1800   ALT 14* 06/24/2015 1800   BILITOT 0.8 06/24/2015 1800      ASSESSMENT & PLAN:  Polycythemia vera ([place]) I have extensive discussion with his wife. The patient has dementia. We discussed the risks and benefits of bone marrow biopsy. Even thought the peripheral blood for JAK 2 mutation is negative, he could still have myeloproliferative disorder and that could only be diagnosed with a bone marrow biopsy. Alternatively, we will continue monthly phlebotomy program to keep hematocrit under 48 or hemoglobin under 16 and to remain on anticoagulation therapy indefinitely. After long discussion, she agree with monthly phlebotomy program only and not to pursue bone marrow biopsy for now. He had responded well to monthly phlebotomy. He does not need it today I plan to see him back in 3 months.  Acute pulmonary embolism He is doing well on anticoagulation therapy. We discussed the risks and benefits of continuing anticoagulation therapy versus stopping. He has family history of clotting disorder and possible undiagnosed  myeloproliferative disorder that would place him at very high risks of recurrence of blood clot if we stop Rx. Recommendation is to proceed with treatment indefinitely   All questions were answered. The patient knows to call the clinic with any problems, questions or concerns. No barriers to learning was detected.  I spent 15 minutes counseling the patient face to face. The total time spent in the appointment was 20 minutes and more than 50% was on counseling.     [facility], John Huckins, MD 5/17/20177:33 AM

## 2016-07-24 ENCOUNTER — Ambulatory Visit (HOSPITAL_BASED_OUTPATIENT_CLINIC_OR_DEPARTMENT_OTHER): Payer: Medicare Other

## 2016-07-24 ENCOUNTER — Other Ambulatory Visit (HOSPITAL_BASED_OUTPATIENT_CLINIC_OR_DEPARTMENT_OTHER): Payer: Medicare Other

## 2016-07-24 ENCOUNTER — Ambulatory Visit (HOSPITAL_BASED_OUTPATIENT_CLINIC_OR_DEPARTMENT_OTHER): Payer: Medicare Other | Admitting: Hematology and Oncology

## 2016-07-24 ENCOUNTER — Encounter: Payer: Self-pay | Admitting: Hematology and Oncology

## 2016-07-24 VITALS — BP 107/75 | HR 76 | Temp 98.3°F | Resp 16

## 2016-07-24 DIAGNOSIS — D45 Polycythemia vera: Secondary | ICD-10-CM

## 2016-07-24 DIAGNOSIS — Z86711 Personal history of pulmonary embolism: Secondary | ICD-10-CM

## 2016-07-24 DIAGNOSIS — Z832 Family history of diseases of the blood and blood-forming organs and certain disorders involving the immune mechanism: Secondary | ICD-10-CM

## 2016-07-24 DIAGNOSIS — F0390 Unspecified dementia without behavioral disturbance: Secondary | ICD-10-CM | POA: Diagnosis not present

## 2016-07-24 LAB — CBC WITH DIFFERENTIAL/PLATELET
BASO%: 0.5 % (ref 0.0–2.0)
BASOS ABS: 0 10*3/uL (ref 0.0–0.1)
EOS ABS: 0.3 10*3/uL (ref 0.0–0.5)
EOS%: 3.3 % (ref 0.0–7.0)
HEMATOCRIT: 52.7 % — AB (ref 38.4–49.9)
HEMOGLOBIN: 17.4 g/dL — AB (ref 13.0–17.1)
LYMPH#: 1.8 10*3/uL (ref 0.9–3.3)
LYMPH%: 21.6 % (ref 14.0–49.0)
MCH: 32.3 pg (ref 27.2–33.4)
MCHC: 32.9 g/dL (ref 32.0–36.0)
MCV: 98.2 fL — ABNORMAL HIGH (ref 79.3–98.0)
MONO#: 0.8 10*3/uL (ref 0.1–0.9)
MONO%: 9.1 % (ref 0.0–14.0)
NEUT#: 5.5 10*3/uL (ref 1.5–6.5)
NEUT%: 65.5 % (ref 39.0–75.0)
PLATELETS: 207 10*3/uL (ref 140–400)
RBC: 5.37 10*6/uL (ref 4.20–5.82)
RDW: 14.6 % (ref 11.0–14.6)
WBC: 8.4 10*3/uL (ref 4.0–10.3)

## 2016-07-24 LAB — COMPREHENSIVE METABOLIC PANEL
ALBUMIN: 3.9 g/dL (ref 3.5–5.0)
ALK PHOS: 61 U/L (ref 40–150)
ALT: 17 U/L (ref 0–55)
ANION GAP: 7 meq/L (ref 3–11)
AST: 20 U/L (ref 5–34)
BILIRUBIN TOTAL: 0.49 mg/dL (ref 0.20–1.20)
BUN: 14.8 mg/dL (ref 7.0–26.0)
CALCIUM: 10 mg/dL (ref 8.4–10.4)
CHLORIDE: 107 meq/L (ref 98–109)
CO2: 27 mEq/L (ref 22–29)
CREATININE: 0.9 mg/dL (ref 0.7–1.3)
EGFR: 89 mL/min/{1.73_m2} — ABNORMAL LOW (ref >90)
Glucose: 95 mg/dl (ref 70–140)
Potassium: 4.4 mEq/L (ref 3.5–5.1)
Sodium: 141 mEq/L (ref 136–145)
Total Protein: 7.4 g/dL (ref 6.4–8.3)

## 2016-07-24 NOTE — Patient Instructions (Signed)

## 2016-07-24 NOTE — Assessment & Plan Note (Signed)
He is doing well on anticoagulation therapy. We discussed the risks and benefits of continuing anticoagulation therapy versus stopping. He has family history of clotting disorder and possible undiagnosed myeloproliferative disorder that would place him at very high risks of recurrence of blood clot if we stop Rx. Recommendation is to proceed with treatment indefinitely He is doing well without bleeding complications 

## 2016-07-24 NOTE — Progress Notes (Signed)
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 He is not able to give an accurate history. The patient was last seen here in July 2016 and is seen again due to recent discovery of high hemoglobin Majority of the history is obtained through his wife. His wife noted that the patient had progressive shortness of breath with minimal exertion. CT angiogram in an outside facility dated 06/24/2015 show evidence of bilateral PE. In addition, recent venous Doppler ultrasound showed extensive right lower extremity DVT. There is positive family history of blood clots. The patient was placed on anticoagulation therapy with plan to continue indefinitely due to what is perceived as an unprovoked blood clot. Comparing to his baseline hemoglobin, it was subsequently noted that the patient have high hemoglobin on 09/26/2015 with hemoglobin high at 19.3 and a hematocrit of 59.4. He was seen here immediately for further evaluation Peripheral blood for JAK2 mutation on 11/07/2015 was negative. He received monthly phlebotomy from December 2016 to May 2017.  INTERVAL HISTORY: John [name] 65 y.o. male returns for further follow-up. He tolerated phlebotomy well. The patient denies any recent signs or symptoms of bleeding such as spontaneous epistaxis, hematuria or hematochezia. He is accompanied by his wife. There were no reported recurrence of blood clots  I have reviewed the past medical history, past surgical history, social history and family history with the patient and they are unchanged from previous note.  ALLERGIES:  has no active allergies.  MEDICATIONS:  Current Outpatient Prescriptions  Medication Sig Dispense Refill  . apixaban (ELIQUIS) 5 MG TABS tablet Take 1 tablet (5 mg total) by mouth 2 (two) times daily. 60 tablet 1  . B COMPLEX VITAMINS  PO Take by mouth 3 (three) times daily.    . folic acid (FOLVITE) 1 MG tablet Take 1 tablet (1 mg total) by mouth daily. 30 tablet 0  . lisinopril (PRINIVIL,ZESTRIL) 10 MG tablet Take 5 mg by mouth daily.     Marland Kitchen loratadine (CLARITIN) 10 MG tablet Take 10 mg by mouth daily.    . Multiple Vitamins-Minerals (MULTIVITAMIN ADULT) TABS Take 1 tablet by mouth daily.    Marland Kitchen thiamine 100 MG tablet Take 1 tablet (100 mg total) by mouth daily. 30 tablet 0  . vitamin B-12 (CYANOCOBALAMIN) 1000 MCG tablet Take 1,000 mcg by mouth daily.     No current facility-administered medications for this visit.      REVIEW OF SYSTEMS:   Constitutional: Denies fevers, chills or night sweats Eyes: Denies blurriness of vision Ears, nose, mouth, throat, and face: Denies mucositis or sore throat Respiratory: Denies cough, dyspnea or wheezes Cardiovascular: Denies palpitation, chest discomfort or lower extremity swelling Gastrointestinal:  Denies nausea, heartburn or change in bowel habits Skin: Denies abnormal skin rashes Lymphatics: Denies new lymphadenopathy or easy bruising Neurological:Denies numbness, tingling or new weaknesses Behavioral/Psych: Mood is stable, no new changes  All other systems were reviewed with the patient and are negative.  PHYSICAL EXAMINATION: ECOG PERFORMANCE STATUS: 0 - Asymptomatic  Vitals:   07/24/16 1539  BP: 116/80  Pulse: 73  Resp: 18  Temp: 97.9 F (36.6 C)   Filed Weights   07/24/16 1539  Weight: 166 lb 14.4 oz (75.7 kg)    GENERAL:alert, no distress and comfortable SKIN: skin color looks plethoric, texture, turgor are normal, no rashes or significant lesions EYES: normal, Conjunctiva are pink and non-injected,  sclera clear OROPHARYNX:no exudate, no erythema and lips, buccal mucosa, and tongue normal  NECK: supple, thyroid normal size, non-tender, without nodularity LYMPH:  no palpable lymphadenopathy in the cervical, axillary or inguinal LUNGS: clear to auscultation  and percussion with normal breathing effort HEART: regular rate & rhythm and no murmurs and no lower extremity edema ABDOMEN:abdomen soft, non-tender and normal bowel sounds Musculoskeletal:no cyanosis of digits and no clubbing    LABORATORY DATA:  I have reviewed the data as listed     Component Value Date/Time   NA 138 06/24/2015 1800   K 4.0 06/24/2015 1800   CL 101 06/24/2015 1800   CO2 24 06/24/2015 1800   GLUCOSE 96 06/24/2015 1800   BUN 11 06/24/2015 1800   CREATININE 0.96 06/24/2015 1800   CALCIUM 9.8 06/24/2015 1800   PROT 6.7 06/24/2015 1800   ALBUMIN 3.9 06/24/2015 1800   AST 22 06/24/2015 1800   ALT 14 (L) 06/24/2015 1800   ALKPHOS 61 06/24/2015 1800   BILITOT 0.8 06/24/2015 1800   GFRNONAA >60 06/24/2015 1800   GFRAA >60 06/24/2015 1800    No results found for: SPEP, UPEP  Lab Results  Component Value Date   WBC 8.4 07/24/2016   NEUTROABS 5.5 07/24/2016   HGB 17.4 (H) 07/24/2016   HCT 52.7 (H) 07/24/2016   MCV 98.2 (H) 07/24/2016   PLT 207 07/24/2016      Chemistry      Component Value Date/Time   NA 138 06/24/2015 1800   K 4.0 06/24/2015 1800   CL 101 06/24/2015 1800   CO2 24 06/24/2015 1800   BUN 11 06/24/2015 1800   CREATININE 0.96 06/24/2015 1800      Component Value Date/Time   CALCIUM 9.8 06/24/2015 1800   ALKPHOS 61 06/24/2015 1800   AST 22 06/24/2015 1800   ALT 14 (L) 06/24/2015 1800   BILITOT 0.8 06/24/2015 1800     ASSESSMENT & PLAN:  Polycythemia vera ([place]) I have extensive discussion with his wife. The patient has dementia. We discussed the risks and benefits of bone marrow biopsy. Even thought the peripheral blood for JAK 2 mutation is negative, he could still have myeloproliferative disorder and that could only be diagnosed with a bone marrow biopsy. Alternatively, we will continue monthly phlebotomy program to keep hematocrit under 48 or hemoglobin under 16 and to remain on anticoagulation therapy indefinitely. After long  discussion, she agree with monthly phlebotomy program only and not to pursue bone marrow biopsy for now. He had responded well to monthly phlebotomy. He is currently on treatment every 3 months which had think is acceptable. I will see him back in 6 months for further workup and evaluation  History of pulmonary embolism He is doing well on anticoagulation therapy. We discussed the risks and benefits of continuing anticoagulation therapy versus stopping. He has family history of clotting disorder and possible undiagnosed myeloproliferative disorder that would place him at very high risks of recurrence of blood clot if we stop Rx. Recommendation is to proceed with treatment indefinitely He is doing well without bleeding complications   All questions were answered. The patient knows to call the clinic with any problems, questions or concerns. No barriers to learning was detected.  I spent 15 minutes counseling the patient face to face. The total time spent in the appointment was 20 minutes and more than 50% was on counseling.     [facility], [place], MD 8/18/20173:53 PM

## 2016-07-24 NOTE — Assessment & Plan Note (Signed)
I have extensive discussion with his wife. The patient has dementia. We discussed the risks and benefits of bone marrow biopsy. Even thought the peripheral blood for JAK 2 mutation is negative, he could still have myeloproliferative disorder and that could only be diagnosed with a bone marrow biopsy. Alternatively, we will continue monthly phlebotomy program to keep hematocrit under 48 or hemoglobin under 16 and to remain on anticoagulation therapy indefinitely. After long discussion, she agree with monthly phlebotomy program only and not to pursue bone marrow biopsy for now. He had responded well to monthly phlebotomy. He is currently on treatment every 3 months which had think is acceptable. I will see him back in 6 months for further workup and evaluation

## 2016-07-28 ENCOUNTER — Telehealth: Payer: Self-pay | Admitting: Hematology and Oncology

## 2016-07-28 NOTE — Telephone Encounter (Signed)
APPTS PER 07/24/16 LOS CONF WITH PATIENT'S WIFE. 07/28/16

## 2016-09-15 ENCOUNTER — Telehealth: Payer: Self-pay | Admitting: *Deleted

## 2016-09-15 NOTE — Telephone Encounter (Signed)
Wife asks if Dr. Alvy Bimler can recommend a substitute for Eliquis that would be less expensive?  She says pt is in "insurance gap" and the Eliquis is very expensive.

## 2016-09-15 NOTE — Telephone Encounter (Signed)
LVM for wife to return nurse's call so we can discuss Eliquis and options for assistance or we can ask Dr. Alvy Bimler for another recommendation.

## 2016-09-16 NOTE — Telephone Encounter (Signed)
S/w Xarelto Rep and he will give [place] 30 day free sample for pt and then pt can use Card to get 1st 30 days free with a new Rx.  Dr. Alvy Bimler ok to switch pt from Eliquis to Xarelto.  Notified wife and she is very grateful, this will save pt hundreds of dollars.  Informed her I will call when samples and new Rx Xarelto ready to pick up. She verbalized understanding.

## 2016-09-16 NOTE — Telephone Encounter (Signed)
Wife says pt has Medicare D and in "donut hole" for next 2 months will have to pay a few hundred dollars each month for Eliquis until January.  Asks if any generic "blood thinners" available such as Plavix?  Informed her Plavix not indicated for P.E.  There are no generics available this RN aware of except for Warfarin which wife says pt does not want to start.   Checked Website for [employer] and copay card only available to pts w/ private insurance and not for Medicare patients.  Xarelto is the same (assistance only available to non medicare pts).  Will ask Dr. Alvy Bimler if she is aware of any other "less expensive"  Alternatives?

## 2016-09-18 ENCOUNTER — Telehealth: Payer: Self-pay | Admitting: *Deleted

## 2016-09-18 MED ORDER — RIVAROXABAN 20 MG PO TABS: 20.0000 mg | ORAL_TABLET | Freq: Every day | ORAL | 11 refills | Status: DC

## 2016-09-18 NOTE — Telephone Encounter (Signed)
One month samples of Xarelto,  New Rx for Xarelto and 30 day free trial card ready for pt to pick up.   Notified wife and she will be by [facility] next week to pick up.  Instructed her to ask for Dr. Calton Dach nurse when she comes in.  She verbalized understanding.

## 2016-10-12 ENCOUNTER — Encounter: Payer: Self-pay | Admitting: *Deleted

## 2016-10-23 ENCOUNTER — Ambulatory Visit (HOSPITAL_BASED_OUTPATIENT_CLINIC_OR_DEPARTMENT_OTHER): Payer: Medicare Other

## 2016-10-23 ENCOUNTER — Other Ambulatory Visit (HOSPITAL_BASED_OUTPATIENT_CLINIC_OR_DEPARTMENT_OTHER): Payer: Medicare Other

## 2016-10-23 DIAGNOSIS — D45 Polycythemia vera: Secondary | ICD-10-CM

## 2016-10-23 LAB — CBC WITH DIFFERENTIAL/PLATELET
BASO%: 0.5 % (ref 0.0–2.0)
Basophils Absolute: 0 10*3/uL (ref 0.0–0.1)
EOS%: 1.7 % (ref 0.0–7.0)
Eosinophils Absolute: 0.1 10*3/uL (ref 0.0–0.5)
HCT: 51.2 % — ABNORMAL HIGH (ref 38.4–49.9)
HEMOGLOBIN: 16.7 g/dL (ref 13.0–17.1)
LYMPH%: 25.9 % (ref 14.0–49.0)
MCH: 31.5 pg (ref 27.2–33.4)
MCHC: 32.5 g/dL (ref 32.0–36.0)
MCV: 96.8 fL (ref 79.3–98.0)
MONO#: 0.8 10*3/uL (ref 0.1–0.9)
MONO%: 10.5 % (ref 0.0–14.0)
NEUT%: 61.4 % (ref 39.0–75.0)
NEUTROS ABS: 4.5 10*3/uL (ref 1.5–6.5)
Platelets: 168 10*3/uL (ref 140–400)
RBC: 5.29 10*6/uL (ref 4.20–5.82)
RDW: 14.5 % (ref 11.0–14.6)
WBC: 7.4 10*3/uL (ref 4.0–10.3)
lymph#: 1.9 10*3/uL (ref 0.9–3.3)

## 2016-10-23 NOTE — Progress Notes (Signed)
1605 started Phlebotomy to right ac with 16G needle.  Removed 510 ml.  Patient accepted food.  Remained for vss.

## 2016-10-23 NOTE — Patient Instructions (Signed)

## 2017-01-22 ENCOUNTER — Other Ambulatory Visit (HOSPITAL_BASED_OUTPATIENT_CLINIC_OR_DEPARTMENT_OTHER): Payer: Medicare Other

## 2017-01-22 ENCOUNTER — Ambulatory Visit (HOSPITAL_BASED_OUTPATIENT_CLINIC_OR_DEPARTMENT_OTHER): Payer: Medicare Other | Admitting: Hematology and Oncology

## 2017-01-22 ENCOUNTER — Telehealth: Payer: Self-pay | Admitting: Hematology and Oncology

## 2017-01-22 ENCOUNTER — Ambulatory Visit: Payer: Medicare Other

## 2017-01-22 DIAGNOSIS — F0390 Unspecified dementia without behavioral disturbance: Secondary | ICD-10-CM

## 2017-01-22 DIAGNOSIS — D45 Polycythemia vera: Secondary | ICD-10-CM

## 2017-01-22 DIAGNOSIS — Z832 Family history of diseases of the blood and blood-forming organs and certain disorders involving the immune mechanism: Secondary | ICD-10-CM

## 2017-01-22 DIAGNOSIS — Z86711 Personal history of pulmonary embolism: Secondary | ICD-10-CM | POA: Diagnosis not present

## 2017-01-22 LAB — CBC WITH DIFFERENTIAL/PLATELET
BASO%: 0.3 % (ref 0.0–2.0)
Basophils Absolute: 0 10*3/uL (ref 0.0–0.1)
EOS%: 1.3 % (ref 0.0–7.0)
Eosinophils Absolute: 0.1 10*3/uL (ref 0.0–0.5)
HCT: 48 % (ref 38.4–49.9)
HGB: 16.4 g/dL (ref 13.0–17.1)
LYMPH%: 25.6 % (ref 14.0–49.0)
MCH: 32.3 pg (ref 27.2–33.4)
MCHC: 34.2 g/dL (ref 32.0–36.0)
MCV: 94.7 fL (ref 79.3–98.0)
MONO#: 0.7 10*3/uL (ref 0.1–0.9)
MONO%: 10.3 % (ref 0.0–14.0)
NEUT%: 62.5 % (ref 39.0–75.0)
NEUTROS ABS: 4.3 10*3/uL (ref 1.5–6.5)
PLATELETS: 167 10*3/uL (ref 140–400)
RBC: 5.07 10*6/uL (ref 4.20–5.82)
RDW: 15.6 % — ABNORMAL HIGH (ref 11.0–14.6)
WBC: 6.9 10*3/uL (ref 4.0–10.3)
lymph#: 1.8 10*3/uL (ref 0.9–3.3)

## 2017-01-22 NOTE — Progress Notes (Signed)
Patient did not require phlebotomy today. Chart opened in error.

## 2017-01-22 NOTE — Telephone Encounter (Signed)
Appointments scheduled per 01/22/17 los. Patient was given a copy of the AVS report and appointment schedule per 01/21/17 los.

## 2017-01-24 ENCOUNTER — Encounter: Payer: Self-pay | Admitting: Hematology and Oncology

## 2017-01-24 NOTE — Assessment & Plan Note (Signed)
He is doing well on anticoagulation therapy. We discussed the risks and benefits of continuing anticoagulation therapy versus stopping. He has family history of clotting disorder and possible undiagnosed myeloproliferative disorder that would place him at very high risks of recurrence of blood clot if we stop Rx. Recommendation is to proceed with treatment indefinitely He is doing well without bleeding complications 

## 2017-01-24 NOTE — Progress Notes (Signed)
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 He is not able to give an accurate history. The patient was last seen here in July 2016 and is seen again due to recent discovery of high hemoglobin Majority of the history is obtained through his wife. His wife noted that the patient had progressive shortness of breath with minimal exertion. CT angiogram in an outside facility dated 06/24/2015 show evidence of bilateral PE. In addition, recent venous Doppler ultrasound showed extensive right lower extremity DVT. There is positive family history of blood clots. The patient was placed on anticoagulation therapy with plan to continue indefinitely due to what is perceived as an unprovoked blood clot. Comparing to his baseline hemoglobin, it was subsequently noted that the patient have high hemoglobin on 09/26/2015 with hemoglobin high at 19.3 and a hematocrit of 59.4. He was seen here immediately for further evaluation Peripheral blood for JAK2 mutation on 11/07/2015 was negative. He received regular phlebotomy from December 2016 onwards  INTERVAL HISTORY: Please see below for problem oriented charting. He is here accompanied by his wife.  With his traumatic brain injury history, the patient does not speak much. According to his wife, he is doing very well. There were no reported recent vascular event with recurrence of blood clots. He is taking chronic anticoagulation therapy. The patient denies any recent signs or symptoms of bleeding such as spontaneous epistaxis, hematuria or hematochezia.  REVIEW OF SYSTEMS:   Constitutional: Denies fevers, chills or abnormal weight loss Eyes: Denies blurriness of vision Ears, nose, mouth, throat, and face: Denies mucositis or sore throat Respiratory:  Denies cough, dyspnea or wheezes Cardiovascular: Denies palpitation, chest discomfort or lower extremity swelling Gastrointestinal:  Denies nausea, heartburn or change in bowel habits Skin: Denies abnormal skin rashes Lymphatics: Denies new lymphadenopathy or easy bruising Neurological:Denies numbness, tingling or new weaknesses Behavioral/Psych: Mood is stable, no new changes  All other systems were reviewed with the patient and are negative.  I have reviewed the past medical history, past surgical history, social history and family history with the patient and they are unchanged from previous note.  ALLERGIES:  has no active allergies.  MEDICATIONS:  Current Outpatient Prescriptions  Medication Sig Dispense Refill  . B COMPLEX VITAMINS PO Take by mouth 3 (three) times daily.    . folic acid (FOLVITE) 1 MG tablet Take 1 tablet (1 mg total) by mouth daily. 30 tablet 0  . lisinopril (PRINIVIL,ZESTRIL) 10 MG tablet Take 5 mg by mouth daily.     Marland Kitchen loratadine (CLARITIN) 10 MG tablet Take 10 mg by mouth daily.    . Multiple Vitamins-Minerals (MULTIVITAMIN ADULT) TABS Take 1 tablet by mouth daily.    . rivaroxaban (XARELTO) 20 MG TABS tablet Take 1 tablet (20 mg total) by mouth daily with supper. 30 tablet 11  . thiamine 100 MG tablet Take 1 tablet (100 mg total) by mouth daily. 30 tablet 0  . vitamin B-12 (CYANOCOBALAMIN) 1000 MCG tablet Take 1,000 mcg by mouth daily.     No current facility-administered medications for this visit.     PHYSICAL EXAMINATION: ECOG PERFORMANCE STATUS: 0 - Asymptomatic  Vitals:   01/22/17 1554  BP: 133/76  Pulse: 77  Resp: 17  Temp: 98.4 F (36.9 C)   Filed Weights   01/22/17 1554  Weight: 166 lb  14.4 oz (75.7 kg)    GENERAL:alert, no distress and comfortable SKIN: skin color, texture, turgor are normal, no rashes or significant lesions EYES: normal, Conjunctiva are pink and non-injected, sclera clear OROPHARYNX:no exudate, no erythema and lips,  buccal mucosa, and tongue normal  NECK: supple, thyroid normal size, non-tender, without nodularity LYMPH:  no palpable lymphadenopathy in the cervical, axillary or inguinal LUNGS: clear to auscultation and percussion with normal breathing effort HEART: regular rate & rhythm and no murmurs and no lower extremity edema ABDOMEN:abdomen soft, non-tender and normal bowel sounds Musculoskeletal:no cyanosis of digits and no clubbing  NEURO: alert & oriented x 3 with fluent speech, no focal motor/sensory deficits  LABORATORY DATA:  I have reviewed the data as listed    Component Value Date/Time   NA 141 07/24/2016 1525   K 4.4 07/24/2016 1525   CL 101 06/24/2015 1800   CO2 27 07/24/2016 1525   GLUCOSE 95 07/24/2016 1525   BUN 14.8 07/24/2016 1525   CREATININE 0.9 07/24/2016 1525   CALCIUM 10.0 07/24/2016 1525   PROT 7.4 07/24/2016 1525   ALBUMIN 3.9 07/24/2016 1525   AST 20 07/24/2016 1525   ALT 17 07/24/2016 1525   ALKPHOS 61 07/24/2016 1525   BILITOT 0.49 07/24/2016 1525   GFRNONAA >60 06/24/2015 1800   GFRAA >60 06/24/2015 1800    No results found for: SPEP, UPEP  Lab Results  Component Value Date   WBC 6.9 01/22/2017   NEUTROABS 4.3 01/22/2017   HGB 16.4 01/22/2017   HCT 48.0 01/22/2017   MCV 94.7 01/22/2017   PLT 167 01/22/2017      Chemistry      Component Value Date/Time   NA 141 07/24/2016 1525   K 4.4 07/24/2016 1525   CL 101 06/24/2015 1800   CO2 27 07/24/2016 1525   BUN 14.8 07/24/2016 1525   CREATININE 0.9 07/24/2016 1525      Component Value Date/Time   CALCIUM 10.0 07/24/2016 1525   ALKPHOS 61 07/24/2016 1525   AST 20 07/24/2016 1525   ALT 17 07/24/2016 1525   BILITOT 0.49 07/24/2016 1525      ASSESSMENT & PLAN:  Polycythemia vera (John [name]) Previously, I have extensive discussion with his wife. The patient has dementia. We discussed the risks and benefits of bone marrow biopsy. Even thought the peripheral blood for JAK 2 mutation is negative, he  could still have myeloproliferative disorder and that could only be diagnosed with a bone marrow biopsy. Alternatively, we will continue periodic phlebotomy program to keep hematocrit under 48 or hemoglobin under 16 and to remain on anticoagulation therapy indefinitely. After long discussion, she agree with monthly phlebotomy program only and not to pursue bone marrow biopsy for now. He had responded well to monthly phlebotomy. With improvement of his hemoglobin level, I recommend we space out the frequency of phlebotomy to every 4 months. I will see him back in 8 months for further workup and evaluation  History of pulmonary embolism He is doing well on anticoagulation therapy. We discussed the risks and benefits of continuing anticoagulation therapy versus stopping. He has family history of clotting disorder and possible undiagnosed myeloproliferative disorder that would place him at very high risks of recurrence of blood clot if we stop Rx. Recommendation is to proceed with treatment indefinitely He is doing well without bleeding complications   No orders of the defined types were placed in this encounter.  All questions were answered. The patient knows to call the clinic with  any problems, questions or concerns. No barriers to learning was detected. I spent 10 minutes counseling the patient face to face. The total time spent in the appointment was 15 minutes and more than 50% was on counseling and review of test results     Heath Lark, MD 01/24/2017 4:17 AM

## 2017-01-24 NOTE — Assessment & Plan Note (Signed)
Previously, I have extensive discussion with his wife. The patient has dementia. We discussed the risks and benefits of bone marrow biopsy. Even thought the peripheral blood for JAK 2 mutation is negative, he could still have myeloproliferative disorder and that could only be diagnosed with a bone marrow biopsy. Alternatively, we will continue periodic phlebotomy program to keep hematocrit under 48 or hemoglobin under 16 and to remain on anticoagulation therapy indefinitely. After long discussion, she agree with monthly phlebotomy program only and not to pursue bone marrow biopsy for now. He had responded well to monthly phlebotomy. With improvement of his hemoglobin level, I recommend we space out the frequency of phlebotomy to every 4 months. I will see him back in 8 months for further workup and evaluation

## 2017-05-07 DIAGNOSIS — K3532 Acute appendicitis with perforation and localized peritonitis, without abscess: Secondary | ICD-10-CM

## 2017-05-07 HISTORY — DX: Acute appendicitis with perforation, localized peritonitis, and gangrene, without abscess: K35.32

## 2017-05-10 ENCOUNTER — Telehealth: Payer: Self-pay | Admitting: *Deleted

## 2017-05-10 NOTE — Telephone Encounter (Signed)
Spoke with wife regarding recent labs at PCP. Will keep appts as scheduled

## 2017-05-21 ENCOUNTER — Other Ambulatory Visit (HOSPITAL_BASED_OUTPATIENT_CLINIC_OR_DEPARTMENT_OTHER): Payer: Medicare Other

## 2017-05-21 DIAGNOSIS — D45 Polycythemia vera: Secondary | ICD-10-CM

## 2017-05-21 LAB — CBC WITH DIFFERENTIAL/PLATELET
BASO%: 0.3 % (ref 0.0–2.0)
Basophils Absolute: 0 10*3/uL (ref 0.0–0.1)
EOS%: 0.7 % (ref 0.0–7.0)
Eosinophils Absolute: 0.1 10*3/uL (ref 0.0–0.5)
HCT: 46.5 % (ref 38.4–49.9)
HEMOGLOBIN: 15.8 g/dL (ref 13.0–17.1)
LYMPH%: 18.8 % (ref 14.0–49.0)
MCH: 32.9 pg (ref 27.2–33.4)
MCHC: 34 g/dL (ref 32.0–36.0)
MCV: 96.9 fL (ref 79.3–98.0)
MONO#: 0.9 10*3/uL (ref 0.1–0.9)
MONO%: 7.9 % (ref 0.0–14.0)
NEUT%: 72.3 % (ref 39.0–75.0)
NEUTROS ABS: 8.3 10*3/uL — AB (ref 1.5–6.5)
Platelets: 278 10*3/uL (ref 140–400)
RBC: 4.8 10*6/uL (ref 4.20–5.82)
RDW: 13.8 % (ref 11.0–14.6)
WBC: 11.5 10*3/uL — AB (ref 4.0–10.3)
lymph#: 2.2 10*3/uL (ref 0.9–3.3)

## 2017-05-31 ENCOUNTER — Emergency Department (HOSPITAL_COMMUNITY)
Admission: EM | Admit: 2017-05-31 | Discharge: 2017-05-31 | Disposition: A | Discharge status: Home / Self Care | Payer: Medicare Other | Source: Home / Self Care

## 2017-05-31 ENCOUNTER — Emergency Department (HOSPITAL_COMMUNITY): Payer: Medicare Other

## 2017-05-31 ENCOUNTER — Encounter (HOSPITAL_COMMUNITY): Payer: Self-pay | Admitting: *Deleted

## 2017-05-31 ENCOUNTER — Other Ambulatory Visit: Payer: Self-pay

## 2017-05-31 DIAGNOSIS — A419 Sepsis, unspecified organism: Secondary | ICD-10-CM | POA: Diagnosis not present

## 2017-05-31 DIAGNOSIS — Z5321 Procedure and treatment not carried out due to patient leaving prior to being seen by health care provider: Secondary | ICD-10-CM

## 2017-05-31 DIAGNOSIS — R531 Weakness: Secondary | ICD-10-CM | POA: Insufficient documentation

## 2017-05-31 DIAGNOSIS — R109 Unspecified abdominal pain: Secondary | ICD-10-CM | POA: Diagnosis not present

## 2017-05-31 LAB — BASIC METABOLIC PANEL
ANION GAP: 11 (ref 5–15)
BUN: 11 mg/dL (ref 6–20)
CALCIUM: 9.4 mg/dL (ref 8.9–10.3)
CHLORIDE: 100 mmol/L — AB (ref 101–111)
CO2: 24 mmol/L (ref 22–32)
Creatinine, Ser: 0.99 mg/dL (ref 0.61–1.24)
GFR calc non Af Amer: >60 mL/min (ref >60)
GLUCOSE: 160 mg/dL — AB (ref 65–99)
POTASSIUM: 3.9 mmol/L (ref 3.5–5.1)
Sodium: 135 mmol/L (ref 135–145)

## 2017-05-31 LAB — CBC
HEMATOCRIT: 48.2 % (ref 39.0–52.0)
HEMOGLOBIN: 15.9 g/dL (ref 13.0–17.0)
MCH: 32.4 pg (ref 26.0–34.0)
MCHC: 33 g/dL (ref 30.0–36.0)
MCV: 98.4 fL (ref 78.0–100.0)
Platelets: 320 10*3/uL (ref 150–400)
RBC: 4.9 MIL/uL (ref 4.22–5.81)
RDW: 14 % (ref 11.5–15.5)
WBC: 17 10*3/uL — ABNORMAL HIGH (ref 4.0–10.5)

## 2017-05-31 NOTE — ED Notes (Signed)
Patient's wife came up to nurse's station and stated they were going to plan on seeing the pt's PCP in the morning instead. Encouraged the wife to have the patient stay, but she stated he has dementia and wife states he would be better at home because he would just get agitated waiting any longer. Pt sitting in wheelchair at this time, NAD noted at this time. Assisted back into car via wheelchair.

## 2017-05-31 NOTE — ED Triage Notes (Signed)
To ED for eval of increased weakness over the past couple of days. Pt had dementia and wife stated that she noted some weakness yesterday. States pt has been leaning to the right. Appears flushed. No vomiting. Decreased appetite today. Pt has been very thirsty per wife.

## 2017-06-01 ENCOUNTER — Inpatient Hospital Stay (HOSPITAL_COMMUNITY)
Admission: EM | Admit: 2017-06-01 | Discharge: 2017-06-14 | DRG: 871 | Disposition: A | Discharge status: Skilled Nursing Facility | Payer: Medicare Other | Attending: Internal Medicine | Admitting: Internal Medicine

## 2017-06-01 ENCOUNTER — Emergency Department (HOSPITAL_COMMUNITY): Payer: Medicare Other

## 2017-06-01 ENCOUNTER — Encounter (HOSPITAL_COMMUNITY): Payer: Self-pay | Admitting: Emergency Medicine

## 2017-06-01 DIAGNOSIS — R451 Restlessness and agitation: Secondary | ICD-10-CM | POA: Diagnosis not present

## 2017-06-01 DIAGNOSIS — R109 Unspecified abdominal pain: Secondary | ICD-10-CM

## 2017-06-01 DIAGNOSIS — Z87891 Personal history of nicotine dependence: Secondary | ICD-10-CM

## 2017-06-01 DIAGNOSIS — M436 Torticollis: Secondary | ICD-10-CM | POA: Diagnosis present

## 2017-06-01 DIAGNOSIS — R10813 Right lower quadrant abdominal tenderness: Secondary | ICD-10-CM | POA: Diagnosis not present

## 2017-06-01 DIAGNOSIS — M542 Cervicalgia: Secondary | ICD-10-CM

## 2017-06-01 DIAGNOSIS — R4182 Altered mental status, unspecified: Secondary | ICD-10-CM

## 2017-06-01 DIAGNOSIS — G9341 Metabolic encephalopathy: Secondary | ICD-10-CM

## 2017-06-01 DIAGNOSIS — A419 Sepsis, unspecified organism: Secondary | ICD-10-CM | POA: Diagnosis present

## 2017-06-01 DIAGNOSIS — E876 Hypokalemia: Secondary | ICD-10-CM | POA: Diagnosis not present

## 2017-06-01 DIAGNOSIS — K4091 Unilateral inguinal hernia, without obstruction or gangrene, recurrent: Secondary | ICD-10-CM | POA: Diagnosis present

## 2017-06-01 DIAGNOSIS — K353 Acute appendicitis with localized peritonitis: Secondary | ICD-10-CM | POA: Diagnosis present

## 2017-06-01 DIAGNOSIS — R10819 Abdominal tenderness, unspecified site: Secondary | ICD-10-CM

## 2017-06-01 DIAGNOSIS — Z7901 Long term (current) use of anticoagulants: Secondary | ICD-10-CM | POA: Diagnosis not present

## 2017-06-01 DIAGNOSIS — F1027 Alcohol dependence with alcohol-induced persisting dementia: Secondary | ICD-10-CM | POA: Diagnosis not present

## 2017-06-01 DIAGNOSIS — I10 Essential (primary) hypertension: Secondary | ICD-10-CM | POA: Diagnosis present

## 2017-06-01 DIAGNOSIS — K5900 Constipation, unspecified: Secondary | ICD-10-CM | POA: Diagnosis not present

## 2017-06-01 DIAGNOSIS — K6819 Other retroperitoneal abscess: Secondary | ICD-10-CM | POA: Diagnosis present

## 2017-06-01 DIAGNOSIS — Z86718 Personal history of other venous thrombosis and embolism: Secondary | ICD-10-CM

## 2017-06-01 DIAGNOSIS — D72829 Elevated white blood cell count, unspecified: Secondary | ICD-10-CM

## 2017-06-01 DIAGNOSIS — E785 Hyperlipidemia, unspecified: Secondary | ICD-10-CM | POA: Diagnosis present

## 2017-06-01 DIAGNOSIS — M5030 Other cervical disc degeneration, unspecified cervical region: Secondary | ICD-10-CM | POA: Diagnosis present

## 2017-06-01 DIAGNOSIS — L03311 Cellulitis of abdominal wall: Secondary | ICD-10-CM | POA: Diagnosis not present

## 2017-06-01 DIAGNOSIS — D45 Polycythemia vera: Secondary | ICD-10-CM | POA: Diagnosis present

## 2017-06-01 DIAGNOSIS — I2510 Atherosclerotic heart disease of native coronary artery without angina pectoris: Secondary | ICD-10-CM | POA: Diagnosis present

## 2017-06-01 DIAGNOSIS — J9811 Atelectasis: Secondary | ICD-10-CM | POA: Diagnosis not present

## 2017-06-01 DIAGNOSIS — K651 Peritoneal abscess: Secondary | ICD-10-CM

## 2017-06-01 DIAGNOSIS — Z86711 Personal history of pulmonary embolism: Secondary | ICD-10-CM | POA: Diagnosis not present

## 2017-06-01 DIAGNOSIS — Z87820 Personal history of traumatic brain injury: Secondary | ICD-10-CM | POA: Diagnosis not present

## 2017-06-01 DIAGNOSIS — Z79899 Other long term (current) drug therapy: Secondary | ICD-10-CM

## 2017-06-01 DIAGNOSIS — K3532 Acute appendicitis with perforation and localized peritonitis, without abscess: Secondary | ICD-10-CM

## 2017-06-01 DIAGNOSIS — D72823 Leukemoid reaction: Secondary | ICD-10-CM | POA: Diagnosis not present

## 2017-06-01 DIAGNOSIS — R400 Somnolence: Secondary | ICD-10-CM | POA: Diagnosis not present

## 2017-06-01 DIAGNOSIS — R32 Unspecified urinary incontinence: Secondary | ICD-10-CM | POA: Diagnosis present

## 2017-06-01 DIAGNOSIS — N179 Acute kidney failure, unspecified: Secondary | ICD-10-CM

## 2017-06-01 DIAGNOSIS — K3533 Acute appendicitis with perforation and localized peritonitis, with abscess: Secondary | ICD-10-CM

## 2017-06-01 DIAGNOSIS — K352 Acute appendicitis with generalized peritonitis: Secondary | ICD-10-CM | POA: Diagnosis not present

## 2017-06-01 DIAGNOSIS — M7981 Nontraumatic hematoma of soft tissue: Secondary | ICD-10-CM | POA: Diagnosis not present

## 2017-06-01 DIAGNOSIS — R1084 Generalized abdominal pain: Secondary | ICD-10-CM | POA: Diagnosis not present

## 2017-06-01 LAB — DIFFERENTIAL
Basophils Absolute: 0 10*3/uL (ref 0.0–0.1)
Basophils Relative: 0 %
EOS PCT: 0 %
Eosinophils Absolute: 0 10*3/uL (ref 0.0–0.7)
LYMPHS ABS: 1.5 10*3/uL (ref 0.7–4.0)
LYMPHS PCT: 9 %
MONO ABS: 1.3 10*3/uL — AB (ref 0.1–1.0)
Monocytes Relative: 8 %
NEUTROS ABS: 13.9 10*3/uL — AB (ref 1.7–7.7)
Neutrophils Relative %: 83 %

## 2017-06-01 LAB — CBC
HEMATOCRIT: 47 % (ref 39.0–52.0)
Hemoglobin: 16.2 g/dL (ref 13.0–17.0)
MCH: 33.1 pg (ref 26.0–34.0)
MCHC: 34.5 g/dL (ref 30.0–36.0)
MCV: 95.9 fL (ref 78.0–100.0)
PLATELETS: 276 10*3/uL (ref 150–400)
RBC: 4.9 MIL/uL (ref 4.22–5.81)
RDW: 13.7 % (ref 11.5–15.5)
WBC: 18.2 10*3/uL — AB (ref 4.0–10.5)

## 2017-06-01 LAB — COMPREHENSIVE METABOLIC PANEL
ALBUMIN: 3.4 g/dL — AB (ref 3.5–5.0)
ALK PHOS: 98 U/L (ref 38–126)
ALT: 47 U/L (ref 17–63)
AST: 38 U/L (ref 15–41)
Anion gap: 11 (ref 5–15)
BILIRUBIN TOTAL: 0.9 mg/dL (ref 0.3–1.2)
BUN: 16 mg/dL (ref 6–20)
CALCIUM: 9.3 mg/dL (ref 8.9–10.3)
CO2: 27 mmol/L (ref 22–32)
CREATININE: 0.96 mg/dL (ref 0.61–1.24)
Chloride: 100 mmol/L — ABNORMAL LOW (ref 101–111)
GFR calc Af Amer: >60 mL/min (ref >60)
GLUCOSE: 124 mg/dL — AB (ref 65–99)
POTASSIUM: 4 mmol/L (ref 3.5–5.1)
Sodium: 138 mmol/L (ref 135–145)
TOTAL PROTEIN: 7.6 g/dL (ref 6.5–8.1)

## 2017-06-01 LAB — URINALYSIS, ROUTINE W REFLEX MICROSCOPIC
Bilirubin Urine: NEGATIVE
GLUCOSE, UA: NEGATIVE mg/dL
Hgb urine dipstick: NEGATIVE
Ketones, ur: NEGATIVE mg/dL
Leukocytes, UA: NEGATIVE
Nitrite: NEGATIVE
PH: 5 (ref 5.0–8.0)
PROTEIN: 30 mg/dL — AB
SPECIFIC GRAVITY, URINE: 1.026 (ref 1.005–1.030)

## 2017-06-01 LAB — PROCALCITONIN: PROCALCITONIN: 0.42 ng/mL

## 2017-06-01 LAB — CBG MONITORING, ED: GLUCOSE-CAPILLARY: 113 mg/dL — AB (ref 65–99)

## 2017-06-01 LAB — PROTIME-INR
INR: 1.4
PROTHROMBIN TIME: 17.3 s — AB (ref 11.4–15.2)

## 2017-06-01 LAB — LACTIC ACID, PLASMA
Lactic Acid, Venous: 1.4 mmol/L (ref 0.5–1.9)
Lactic Acid, Venous: 0.9 mmol/L (ref 0.5–1.9)

## 2017-06-01 LAB — TROPONIN I

## 2017-06-01 MED ORDER — ACETAMINOPHEN 650 MG RE SUPP: 650.0000 mg | Freq: Four times a day (QID) | RECTAL | Status: DC | PRN

## 2017-06-01 MED ORDER — ACETAMINOPHEN 325 MG PO TABS
650.0000 mg | ORAL_TABLET | Freq: Once | ORAL | Status: AC
  Administered 2017-06-01: 650 mg via ORAL
  Filled 2017-06-01: qty 2

## 2017-06-01 MED ORDER — SODIUM CHLORIDE 0.9 % IV BOLUS (SEPSIS)
500.0000 mL | Freq: Once | INTRAVENOUS | Status: AC
  Administered 2017-06-01: 500 mL via INTRAVENOUS

## 2017-06-01 MED ORDER — ACETAMINOPHEN 325 MG PO TABS
650.0000 mg | ORAL_TABLET | Freq: Four times a day (QID) | ORAL | Status: DC | PRN
  Administered 2017-06-03 – 2017-06-10 (×2): 650 mg via ORAL
  Filled 2017-06-01 (×3): qty 2

## 2017-06-01 MED ORDER — ONDANSETRON HCL 4 MG/2ML IJ SOLN
4.0000 mg | Freq: Four times a day (QID) | INTRAMUSCULAR | Status: DC | PRN
Start: 2017-06-01 — End: 2017-06-14

## 2017-06-01 MED ORDER — LORAZEPAM 2 MG/ML IJ SOLN
0.5000 mg | Freq: Two times a day (BID) | INTRAMUSCULAR | Status: DC | PRN
  Administered 2017-06-04 – 2017-06-11 (×4): 0.5 mg via INTRAVENOUS
  Filled 2017-06-01 (×5): qty 1

## 2017-06-01 MED ORDER — SODIUM CHLORIDE 0.9 % IV BOLUS (SEPSIS)
1000.0000 mL | Freq: Once | INTRAVENOUS | Status: AC
  Administered 2017-06-01: 1000 mL via INTRAVENOUS

## 2017-06-01 MED ORDER — SODIUM CHLORIDE 0.9 % IV BOLUS (SEPSIS)
500.0000 mL | Freq: Once | INTRAVENOUS | Status: AC
Start: 2017-06-01 — End: 2017-06-01
  Administered 2017-06-01: 500 mL via INTRAVENOUS

## 2017-06-01 MED ORDER — IOPAMIDOL (ISOVUE-300) INJECTION 61%
100.0000 mL | Freq: Once | INTRAVENOUS | Status: AC | PRN
  Administered 2017-06-01: 100 mL via INTRAVENOUS

## 2017-06-01 MED ORDER — PIPERACILLIN-TAZOBACTAM 3.375 G IVPB
3.3750 g | Freq: Three times a day (TID) | INTRAVENOUS | Status: DC
  Administered 2017-06-02 – 2017-06-14 (×38): 3.375 g via INTRAVENOUS
  Filled 2017-06-01 (×33): qty 50

## 2017-06-01 MED ORDER — ENOXAPARIN SODIUM 40 MG/0.4ML ~~LOC~~ SOLN
40.0000 mg | SUBCUTANEOUS | Status: DC
  Administered 2017-06-02 – 2017-06-03 (×2): 40 mg via SUBCUTANEOUS
  Filled 2017-06-01 (×2): qty 0.4

## 2017-06-01 MED ORDER — LORAZEPAM 2 MG/ML IJ SOLN
0.5000 mg | Freq: Once | INTRAMUSCULAR | Status: AC
Start: 2017-06-01 — End: 2017-06-01
  Administered 2017-06-01: 0.5 mg via INTRAVENOUS
  Filled 2017-06-01: qty 1

## 2017-06-01 MED ORDER — THIAMINE HCL 100 MG/ML IJ SOLN
100.0000 mg | Freq: Every day | INTRAMUSCULAR | Status: DC
  Administered 2017-06-01 – 2017-06-08 (×8): 100 mg via INTRAVENOUS
  Filled 2017-06-01 (×10): qty 2

## 2017-06-01 MED ORDER — FOLIC ACID 5 MG/ML IJ SOLN
1.0000 mg | Freq: Every day | INTRAMUSCULAR | Status: DC
  Administered 2017-06-02 – 2017-06-08 (×7): 1 mg via INTRAVENOUS
  Filled 2017-06-01 (×9): qty 0.2

## 2017-06-01 MED ORDER — HYDROMORPHONE HCL 1 MG/ML IJ SOLN
0.5000 mg | INTRAMUSCULAR | Status: DC | PRN
  Administered 2017-06-02: 0.5 mg via INTRAVENOUS
  Filled 2017-06-01: qty 0.5

## 2017-06-01 MED ORDER — PIPERACILLIN-TAZOBACTAM 3.375 G IVPB 30 MIN
3.3750 g | Freq: Once | INTRAVENOUS | Status: AC
  Administered 2017-06-01: 3.375 g via INTRAVENOUS
  Filled 2017-06-01: qty 50

## 2017-06-01 MED ORDER — FAMOTIDINE IN NACL 20-0.9 MG/50ML-% IV SOLN
20.0000 mg | Freq: Two times a day (BID) | INTRAVENOUS | Status: DC
  Administered 2017-06-01 – 2017-06-08 (×15): 20 mg via INTRAVENOUS
  Filled 2017-06-01 (×16): qty 50

## 2017-06-01 MED ORDER — SODIUM CHLORIDE 0.9 % IV SOLN
INTRAVENOUS | Status: DC
  Administered 2017-06-01 – 2017-06-06 (×7): via INTRAVENOUS

## 2017-06-01 MED ORDER — IOPAMIDOL (ISOVUE-300) INJECTION 61%
INTRAVENOUS | Status: AC
  Filled 2017-06-01: qty 100

## 2017-06-01 MED ORDER — ONDANSETRON HCL 4 MG PO TABS: 4.0000 mg | ORAL_TABLET | Freq: Four times a day (QID) | ORAL | Status: DC | PRN

## 2017-06-01 MED ORDER — PIPERACILLIN-TAZOBACTAM 4.5 G IVPB: 4.5000 g | Freq: Once | INTRAVENOUS | Status: DC

## 2017-06-01 NOTE — ED Notes (Signed)
Hospitalist at bedside 

## 2017-06-01 NOTE — Progress Notes (Signed)
Pharmacy Antibiotic Note  John [name] is a 66 y.o. male admitted on 06/01/2017 with probable ruptured appendicitis with abscess. Pharmacy has been consulted for Zosyn dosing.  Plan: Zosyn 3.375g IV x 1 over 30 minutes given in the ED. Continue with Zosyn 3.375g IV q8h (infuse over 4 hours). Monitor renal function, cultures, clinical course.   Height: 5\' 10"  (177.8 cm) Weight: 166 lb (75.3 kg) IBW/kg (Calculated) : 73  Temp (24hrs), Avg:100 F (37.8 C), Min:98.3 F (36.8 C), Max:101.5 F (38.6 C)   Recent Labs Lab 05/31/17 1813 06/01/17 1219 06/01/17 1358 06/01/17 1658  WBC 17.0* 18.2*  --   --   CREATININE 0.99 0.96  --   --   LATICACIDVEN  --   --  1.4 0.9    Estimated Creatinine Clearance: 78.2 mL/min (by C-G formula based on SCr of 0.96 mg/dL).    No Known Allergies  Antimicrobials this admission: 6/26 Zosyn >>  Dose adjustments this admission: --  Microbiology results: 6/26 BCx: sent   Thank you for allowing pharmacy to be a part of this patient's care.   Lindell Spar, PharmD, BCPS Pager: [phone] 06/01/2017 8:23 PM

## 2017-06-01 NOTE — ED Provider Notes (Signed)
[facility] DEPT Provider Note   CSN: [id number] Arrival date & time: 06/01/17  1015     History   Chief Complaint Chief Complaint  Patient presents with  . Altered Mental Status    HPI John [name] is a 66 y.o. male.  HPI  Patient with past medical history of dementia, presents with one-week history of altered mental status, decreased energy, decreased appetite. Wife states that patient is usually able to eat on his own, follows injections such as putting his shoes on. She states that since last week, when he experienced a fall (she denies any head injury or loss of consciousness), he has progressively gotten more altered. She states that his baseline he is able to walk but for the past few days he is unable to walk and he can "barely get out of bed." She reports no bowel movement in a few days and eating less. He has a history of about 2 years ago and has been on Xarelto which she reports compliance with. States he is unable to say if he is having any pain. She reports normal urination. Denies any fever, vomiting, diarrhea.  Past Medical History:  Diagnosis Date  . Adenomatous colon polyp   . Alcohol dependence ([place])   . Ataxia 07/05/2013  . Conjunctivitis, allergic   . 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 Hyperlipemia   . Hypertension   . Memory loss    dementia was in question in 2004  . Peyronie's disease    presumptive,resolved  . Polycythemia vera ([place]) 11/07/2015  . Vitamin B 12 deficiency     Patient Active Problem List   Diagnosis Date Noted  . Polycythemia vera ([place]) 11/07/2015  . History of pulmonary embolism 06/24/2015  . Dyspnea 06/24/2015  . Essential hypertension 06/24/2015  . Alcohol abuse   . Ataxia 07/05/2013  . 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 FRACTURE SURGERY  1997   leg  . INGUINAL HERNIA REPAIR Bilateral    w/ mesh  . TONSILLECTOMY  1973        Home Medications    Prior to Admission medications   Medication Sig Start Date End Date Taking? Authorizing Provider  B COMPLEX VITAMINS PO Take by mouth 3 (three) times daily.   Yes [provider]  folic acid (FOLVITE) 1 MG tablet Take 1 tablet (1 mg total) by mouth daily. 06/25/15  Yes Thurnell Lose, MD  loratadine (CLARITIN) 10 MG tablet Take 10 mg by mouth daily.   Yes [provider]  rivaroxaban (XARELTO) 20 MG TABS tablet Take 1 tablet (20 mg total) by mouth daily with supper. 09/18/16  Yes Gorsuch, Ni, MD  thiamine 100 MG tablet Take 1 tablet (100 mg total) by mouth daily. 06/25/15  Yes Thurnell Lose, MD  vitamin B-12 (CYANOCOBALAMIN) 1000 MCG tablet Take 1,000 mcg by mouth daily.   Yes [provider]    Family History Family History  Problem Relation Age of Onset  . Glaucoma Mother   . Stroke Father   . Clotting disorder Father   . Clotting disorder Sister     Social History Social History  Substance Use Topics  . Smoking status: Former Smoker    Quit date: 12/07/1972  . Smokeless tobacco: Never Used  . Alcohol use 1.8 oz/week    3 Glasses of wine per week     Comment: 2 glasses of wine daily  Allergies   Patient has no known allergies.   Review of Systems Review of Systems  Constitutional: Positive for activity change and appetite change. Negative for chills and fever.  HENT: Negative for ear pain, rhinorrhea, sneezing and sore throat.   Eyes: Negative for photophobia and visual disturbance.  Respiratory: Negative for cough, chest tightness, shortness of breath and wheezing.   Cardiovascular: Negative for chest pain and palpitations.  Gastrointestinal: Positive for abdominal pain and constipation. Negative for blood in stool, diarrhea, nausea and vomiting.  Endocrine: Negative for polyphagia and polyuria.  Genitourinary: Negative for dysuria, hematuria and urgency.  Musculoskeletal: Negative for myalgias.  Skin:  Negative for rash.  Neurological: Positive for weakness. Negative for dizziness and light-headedness.  Psychiatric/Behavioral: Positive for confusion.  Review of systems provided by wife.   Physical Exam Updated Vital Signs BP 102/74   Pulse 63   Temp (!) 101.5 F (38.6 C) (Rectal)   Resp 20   SpO2 97%   Physical Exam  Constitutional: He appears well-developed and well-nourished. No distress.  Pleasantly confused.  HENT:  Head: Normocephalic and atraumatic.  Eyes: Conjunctivae and EOM are normal. Pupils are equal, round, and reactive to light. No scleral icterus.  Neck: Normal range of motion.  Cardiovascular: Normal rate and regular rhythm.   Pulmonary/Chest: Effort normal and breath sounds normal. No respiratory distress.  Abdominal: Soft. Bowel sounds are normal. He exhibits no distension. There is tenderness (Right lower quadrant). There is no rebound and no guarding.  Neurological: He is alert.  Patient is oriented to person. He is able to identify his wife. He is unable to follow directions in order to test strength or sensation.   Skin: No rash noted. He is not diaphoretic.  Psychiatric: He has a normal mood and affect.  Nursing note and vitals reviewed.    ED Treatments / Results  Labs (all labs ordered are listed, but only abnormal results are displayed) Labs Reviewed  COMPREHENSIVE METABOLIC PANEL - Abnormal; Notable for the following:       Result Value   Chloride 100 (*)    Glucose, Bld 124 (*)    Albumin 3.4 (*)    All other components within normal limits  CBC - Abnormal; Notable for the following:    WBC 18.2 (*)    All other components within normal limits  URINALYSIS, ROUTINE W REFLEX MICROSCOPIC - Abnormal; Notable for the following:    Color, Urine AMBER (*)    Protein, ur 30 (*)    Bacteria, UA FEW (*)    Squamous Epithelial / LPF 0-5 (*)    All other components within normal limits  DIFFERENTIAL - Abnormal; Notable for the following:    Neutro  Abs 13.9 (*)    Monocytes Absolute 1.3 (*)    All other components within normal limits  CBG MONITORING, ED - Abnormal; Notable for the following:    Glucose-Capillary 113 (*)    All other components within normal limits  CULTURE, BLOOD (ROUTINE X 2)  CULTURE, BLOOD (ROUTINE X 2)  TROPONIN I  LACTIC ACID, PLASMA  LACTIC ACID, PLASMA    EKG  EKG Interpretation None        Radiology Dg Chest 2 View  Result Date: 06/01/2017 CLINICAL DATA:  Altered mental status, dementia, slight drooping on the right EXAM: CHEST  2 VIEW COMPARISON:  Chest x-ray of 05/31/2017 FINDINGS: No active infiltrate or effusion is seen. Mediastinal and hilar contours are unremarkable. The heart is within normal limits  in size. No acute bony abnormality is seen. IMPRESSION: No active cardiopulmonary disease. Electronically Signed   By: Ivar Drape M.D.   On: 06/01/2017 14:42   Dg Chest 2 View  Result Date: 05/31/2017 CLINICAL DATA:  Fever and weakness. EXAM: CHEST  2 VIEW COMPARISON:  06/24/2015 chest radiograph FINDINGS: The cardiomediastinal silhouette is unremarkable. Mild bibasilar atelectasis noted. There is no evidence of focal airspace disease, pulmonary edema, suspicious pulmonary nodule/mass, pleural effusion, or pneumothorax. No acute bony abnormalities are identified. IMPRESSION: Mild basilar atelectasis. Electronically Signed   By: Margarette Canada M.D.   On: 05/31/2017 18:54   Ct Head Wo Contrast  Result Date: 06/01/2017 CLINICAL DATA:  Increased sleeping EXAM: CT HEAD WITHOUT CONTRAST TECHNIQUE: Contiguous axial images were obtained from the base of the skull through the vertex without intravenous contrast. COMPARISON:  None. FINDINGS: Brain: There is severe global atrophy. Minimal chronic ischemic changes in the periventricular white matter are noted. No mass effect, midline shift, or acute hemorrhage. Vascular: No hyperdense vessel or unexpected calcification. Skull: Cranium is intact. Sinuses/Orbits:  Mastoid air cells are clear. Visualized paranasal sinuses are clear. Other: Noncontributory IMPRESSION: No acute intracranial pathology. Electronically Signed   By: Marybelle Killings M.D.   On: 06/01/2017 17:00   Ct Abdomen Pelvis W Contrast  Result Date: 06/01/2017 CLINICAL DATA:  66 year old male with right abdominal and pelvic pain for several days. EXAM: CT ABDOMEN AND PELVIS WITH CONTRAST TECHNIQUE: Multidetector CT imaging of the abdomen and pelvis was performed using the standard protocol following bolus administration of intravenous contrast. CONTRAST:  17mL ISOVUE-300 IOPAMIDOL (ISOVUE-300) INJECTION 61% COMPARISON:  None. FINDINGS: Lower chest: Mild bibasilar atelectasis/ scarring noted. Coronary artery calcifications are present. Hepatobiliary: The liver and gallbladder are unremarkable. There is no evidence of biliary dilatation. Pancreas: Unremarkable Spleen: Unremarkable Adrenals/Urinary Tract: The kidneys, adrenal glands and bladder are unremarkable except for bilateral renal parapelvic cysts. Stomach/Bowel: A thickened inflamed appendix is noted with adjacent 5.2 x 6 x 7 cm complex fluid and inflammation in the lateral right lower abdomen abutting the abdominal musculature and compatible with ruptured appendicitis and ill-defined/developing abscess. There is no evidence of bowel obstruction or pneumoperitoneum. Colonic diverticulosis noted without evidence of diverticulitis. Vascular/Lymphatic: Aortic atherosclerosis. No enlarged abdominal or pelvic lymph nodes. Reproductive: Prostate unremarkable Other: No ascites. A moderate right inguinal hernia containing fat is noted. Musculoskeletal: No acute or suspicious abnormality. IMPRESSION: Ruptured appendicitis with adjacent 5.2 x 6 x 7 cm ill-defined/developing abscess. No evidence of pneumoperitoneum or bowel obstruction. Coronary artery disease. Aortic Atherosclerosis (ICD10-I70.0). Moderate right inguinal hernia containing fat. Results were called  by telephone at the time of interpretation on 06/01/2017 at 5:07 pm to [facility] , who verbally acknowledged these results. Electronically Signed   By: Margarette Canada M.D.   On: 06/01/2017 17:10    Procedures Procedures (including critical care time)  Medications Ordered in ED Medications  0.9 %  sodium chloride infusion ( Intravenous New Bag/Given 06/01/17 1518)  iopamidol (ISOVUE-300) 61 % injection (not administered)  sodium chloride 0.9 % bolus 500 mL (0 mLs Intravenous Stopped 06/01/17 1707)  acetaminophen (TYLENOL) tablet 650 mg (650 mg Oral Given 06/01/17 1506)  sodium chloride 0.9 % bolus 500 mL (0 mLs Intravenous Stopped 06/01/17 1707)  iopamidol (ISOVUE-300) 61 % injection 100 mL (100 mLs Intravenous Contrast Given 06/01/17 1645)  sodium chloride 0.9 % bolus 1,000 mL (1,000 mLs Intravenous New Bag/Given 06/01/17 1809)  piperacillin-tazobactam (ZOSYN) IVPB 3.375 g (3.375 g Intravenous New Bag/Given 06/01/17 1809)  Initial Impression / Assessment and Plan / ED Course  I have reviewed the triage vital signs and the nursing notes.  Pertinent labs & imaging results that were available during my care of the patient were reviewed by me and considered in my medical decision making (see chart for details).     Patient presents to ED for altered mental status and an confusion. Patient has a history of dementia due to alcoholism and TBI wife states that he has progressively become more altered and unable to follow directions for the past. She reports a fall last week was no head injury or loss of consciousness reported.  Patient is significantly tender to palpation in the right lower quadrant. EKG showed no changes from previous tracings. Troponin negative 1. Fever of 101.5 here in the ED. WBC count elevated to 18 from 17.2 yesterday. Lactic acid normal 1. He is unable to follow directions and is oriented to person. Unable to test strength or sensation. CT head and chest x-ray unremarkable. CT  of the abdomen and pelvis showed ruptured appendicitis with adjacent 5 x 6 x 7 cm developing abscess. Patient started on IV fluids and Zosyn here. Surgery was consulted who states that they will consult IR for possible drainage of the abscess. Requests that we admit to hospitalist team for further evaluation of medical conditions and to hold Xarelto. Spoke to hospitalist team who will admit patient.  Patient discussed with Dr. Thurnell Garbe who agreed with above plan.  Final Clinical Impressions(s) / ED Diagnoses   Final diagnoses:  Ruptured appendicitis  Altered mental status, unspecified altered mental status type    New Prescriptions New Prescriptions   No medications on file     Delia Heady, PA-C 06/02/17 [place], [place], DO 06/04/17 [phone]

## 2017-06-01 NOTE — Consult Note (Signed)
Re:   John [name] [name] DOB:   Jul 30, 1951 MRN:   [id number]  Chief Complaint Abdominal pain, confusion  ASSESEMENT AND PLAN: 1.  Probable ruptured appendicitis with abscess  Plan:  IV antibiotics, fluid resuscitation, perc drain of abscess, NPO past midnight.  I spoke to John [name] [name] (IR) - will plan perc drain in the AM.  2.  Dementia - traumatic brain injury in 1998  Possibly EtOH related  According to his wife, he has had a significant decline in the last 5 years 3.  History of PE  Seen on CT angio - 06/23/2105 - secondary to extensive RLE DVT  4.  Anticoagulated on xarelto 5.  Recurrent right inguinal hernia 6.  Polycythemia vera   Sees Dr. Natale [name]  Chief Complaint  Patient presents with  . Altered Mental Status   PHYSICIAN REQUESTING CONSULTATION: John [name] Caraway, MD  HISTORY OF PRESENT ILLNESS: John [name] [name] is a 66 y.o. (DOB: 10/27/1951)  white male whose primary care physician is John [name] Caraway, MD and comes to [facility] ER today for worsening confusion. His wife, John [name] [name], is at the bedside.  She provides the history, he cannot provide any meaningful information.  He fell last Wednesday and hit a coffee table - he had not LOC and seemed fine afterwards.  John [name] [name] took him to [name] ER last PM, because she was worried about some slumping and she wondered whether he had had a stroke.  They left prior to being seen - there was a lock down secondary to gun issues.  She brought him back to [facility] ER today.  It looks like the initial focus was on his head, but also noted to have RLQ tenderness.  His wife reports that his appetite has not been good. He had a colonoscopy about 7 or 8 years ago.  She is unsure who did the colonoscopy.  He had bilateral inguinal hernias repaired laparoscopically in 12/28/2008 by Dr. Keturah [name]. John [name] [name] (she did not remember that I had repaired his hernias).  He has no known stomach, liver, or pancreas disease.  CT head - 06/01/2017 - No acute pathology CT abdomen/pelvis  - 06/01/2017 - 1) Ruptured appendicitis with adjacent 5.2 x 6 x 7 cm ill-defined/developing abscess. No evidence of pneumoperitoneum or bowel obstruction.   2) Coronary artery disease.  3) Aortic Atherosclerosis (ICD10-I70.0).  4) Moderate right inguinal hernia containing fat.    Past Medical History:  Diagnosis Date  . Adenomatous colon polyp   . Alcohol dependence (John [name] [name])   . Ataxia 07/05/2013  . Conjunctivitis, allergic   . 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 Hyperlipemia   . Hypertension   . Memory loss    dementia was in question in 2004  . Peyronie's disease    presumptive,resolved  . Polycythemia vera ([place]) 11/07/2015  . Vitamin B 12 deficiency       Past Surgical History:  Procedure Laterality Date  . FRACTURE SURGERY  1997   leg  . INGUINAL HERNIA REPAIR Bilateral    w/ mesh  . TONSILLECTOMY  1973      Current Facility-Administered Medications  Medication Dose Route Frequency Provider Last Rate Last Dose  . 0.9 %  sodium chloride infusion   Intravenous Continuous John [name] Graven, DO 100 mL/hr at 06/01/17 1518    . iopamidol (ISOVUE-300) 61 % injection           . piperacillin-tazobactam (ZOSYN) IVPB 3.375 g  3.375 g Intravenous Once John [name] Graven, DO      .  sodium chloride 0.9 % bolus 1,000 mL  1,000 mL Intravenous Once John [name] Graven, DO       Current Outpatient Prescriptions  Medication Sig Dispense Refill  . B COMPLEX VITAMINS PO Take by mouth 3 (three) times daily.    . folic acid (FOLVITE) 1 MG tablet Take 1 tablet (1 mg total) by mouth daily. 30 tablet 0  . loratadine (CLARITIN) 10 MG tablet Take 10 mg by mouth daily.    . rivaroxaban (XARELTO) 20 MG TABS tablet Take 1 tablet (20 mg total) by mouth daily with supper. 30 tablet 11  . thiamine 100 MG tablet Take 1 tablet (100 mg total) by mouth daily. 30 tablet 0  . vitamin B-12 (CYANOCOBALAMIN) 1000 MCG tablet Take 1,000 mcg by mouth daily.       No Known Allergies  REVIEW  OF SYSTEMS: Skin:  No history of rash.  No history of abnormal moles. Infection:  No history of hepatitis or HIV.  No history of MRSA. Neurologic:  Progressive dementia - especially over the last 5 years. Cardiac:  He was on HTN meds, but has been off for the last 6 months. Pulmonary:  History of PE in 2016.  Endocrine:  No diabetes. No thyroid disease. Gastrointestinal:  See HPI Urologic:  No history of kidney stones.  No history of bladder infections. Musculoskeletal:  No history of joint or back disease.  He was able to walk until the last day or two. Hematologic:  Anticoagulated on xarelto.  Last dose 05/30/2017 Psycho-social:  The patient is alert, but does not recognize his wife, know where he is or carry on any conversation.  SOCIAL and FAMILY HISTORY: Married.  Wife, John [name] [name], with patient 2 sons:  70 yo, [place], and 57, yo Rodman [place].  PHYSICAL EXAM: BP 100/77   Pulse 75   Temp (!) 101.5 F (38.6 C) (Rectal)   Resp 15   SpO2 96%   General: WN WM who is alert.  He talks, but does not respond appropriately to questions.  He cannot identify his wife..  Skin:  Inspection and palpation - no mass or rash. Eyes:  Conjunctiva and lids unremarkable.            Pupils are equal Ears, Nose, Mouth, and Throat:  Ears and nose unremarkable            Lips and teeth are unremarable. Neck: Supple. No mass, trachea midline.  No thyroid mass. Lymph Nodes:  No supraclavicular, cervical, or inguinal nodes. Lungs: Normal respiratory effort.  Clear to auscultation and symmetric breath sounds. Heart:  Palpation of the heart is normal.            Auscultation: RRR. No murmur or rub.  Abdomen: Soft. No mass. Very tender over right lateral abdomen.  Has small right inguinal hernia. Rectal: Not done. Musculoskeletal:  Good muscle strength and ROM  in upper and lower extremities. Neurologic:  Grossly intact to motor and sensory function. Psychiatric: Not oriented to time, person, place.   DATA  REVIEWED, COUNSELING AND COORDINATION OF CARE: Epic notes reviewed. Counseling and coordination of care exceeded more than 50% of the time spent with patient. Total time spent with patient and charting: 50 minutes  Alphonsa Overall, MD,  [name], [address]  [place], [place]    [place] Phone:  [phone] FAX:  [phone]

## 2017-06-01 NOTE — H&P (Signed)
History and Physical    John [name] BZJ:[id number] DOB: 1951-07-25 DOA: 06/01/2017  PCP: Cari Caraway, MD   Patient coming from: Home  Chief Complaint: Weakness, decreased PO intake, mental status changes  HPI: John [name] is a 66 y.o. gentleman with a history of polycythemia vera (managed with intermittent phlebotomy by Dr. Alvy Bimler), dementia attributed to prior TBI and/or EtOH, and PE (anticoagulated with Xarelto) who presents to the ED accompanied by his wife and one of his sons for evaluation of progressive weakness, lethargy, mental status changes, and decreased PO intake since last week.  They were in the ED last night at [facility] but left before being seen.  The patient is unable to give any meaningful history due to his baseline dementia (only oriented to person at baseline).  The patient has had subjective fevers and chills.  He has appeared to be in pain, but could not localize it, although he has been "leaning to the right side" when he walks.  No nausea, vomiting, or diarrhea, but his appetite has been poor since the weekend.  He had one episode of choking on Sunday.  He has a history of constipation.  He actually fell last Wednesday, hitting a coffee table, but he was not brought to medical attention.  No reported head injury.  No LOC.    He has urinary incontinence at baseline.  ED Course: Fever to 101.5.  WBC count 18.2.  Chest xray negative for acute process.  Head CT negative for acute process.  CT of the abdomen and pelvis with contrast shows ruptured appendicitis with abscess (5x6x7 cm) without evidence of pneumoperitoneum or bowel obstruction at this time.  Normal lactic acid level.  Negative troponin.  U/A does not appear infected.  Blood cultures are pending.  The patient has received 2L of NS and IV zosyn.  General Surgery has been consulted.  Hospitalist asked to admit.  Review of Systems: Unable to obtain due to patient's history of dementia and acute mental status  changes.   Past Medical History:  Diagnosis Date  . Adenomatous colon polyp   . Alcohol dependence ([place])   . Ataxia 07/05/2013  . Conjunctivitis, allergic   . 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 Hyperlipemia   . Hypertension   . Memory loss    dementia was in question in 2004  . Peyronie's disease    presumptive,resolved  . Polycythemia vera ([place]) 11/07/2015  . Vitamin B 12 deficiency     Past Surgical History:  Procedure Laterality Date  . FRACTURE SURGERY  1997   leg  . INGUINAL HERNIA REPAIR Bilateral    w/ mesh  . TONSILLECTOMY  1973     reports that he quit smoking about 44 years ago. He has never used smokeless tobacco. Family reports past EtOH use; none currently.  No history of illicit drug use.  He is married.  He has two sons.  His wife is his NOK/POA.    No Known Allergies  Family History  Problem Relation Age of Onset  . Glaucoma Mother   . Stroke Father   . Clotting disorder Father   . Clotting disorder Sister      Prior to Admission medications   Medication Sig Start Date End Date Taking? Authorizing Provider  B COMPLEX VITAMINS PO Take by mouth 3 (three) times daily.   Yes [provider]  folic acid (FOLVITE) 1 MG tablet Take 1 tablet (1 mg total) by mouth daily. 06/25/15  Yes Thurnell Lose, MD  loratadine (CLARITIN) 10 MG tablet Take 10 mg by mouth daily.   Yes [provider]  rivaroxaban (XARELTO) 20 MG TABS tablet Take 1 tablet (20 mg total) by mouth daily with supper. 09/18/16  Yes Gorsuch, Ni, MD  thiamine 100 MG tablet Take 1 tablet (100 mg total) by mouth daily. 06/25/15  Yes Thurnell Lose, MD  vitamin B-12 (CYANOCOBALAMIN) 1000 MCG tablet Take 1,000 mcg by mouth daily.   Yes [provider]    Physical Exam: Vitals:   06/01/17 1600 06/01/17 1700 06/01/17 1800 06/01/17 1830  BP: 104/74 100/77 94/64 102/74  Pulse: 75 75 62 63  Resp: 14 15 18 20   Temp:      TempSrc:      SpO2: 95% 96%  94% 97%      Constitutional: NAD, agitated and restless but he is hemodynamically stable at this time. Vitals:   06/01/17 1600 06/01/17 1700 06/01/17 1800 06/01/17 1830  BP: 104/74 100/77 94/64 102/74  Pulse: 75 75 62 63  Resp: 14 15 18 20   Temp:      TempSrc:      SpO2: 95% 96% 94% 97%   Eyes: PERRL, lids and conjunctivae normal ENMT: Mucous membranes are moist. Posterior pharynx not visualized because the patient refuses to open his mouth.    Neck: normal appearance, supple, no masses Respiratory: clear to auscultation bilaterally, no wheezing, no crackles. Normal respiratory effort. No accessory muscle use.  Cardiovascular: Normal rate, regular rhythm, no murmurs / rubs / gallops. No extremity edema. 2+ pedal pulses. GI: abdomen is mildly distended but soft and compressible.  Mild guarding with palpation to the right and left lower quadrants.  Bowel sounds are present. Musculoskeletal:  No joint deformity in upper and lower extremities. Good ROM, no contractures. Normal muscle tone.  Skin: no rashes, warm and dry Neurologic: Limited by mental status changes but face appears symmetric.  Strength symmetric.  He moves all four extremities spontaneously.  Intermittently following commands. Psychiatric: Oriented to person only.  Smiles inappropriately.  Impaired judgment/insight at baseline.    Labs on Admission: I have personally reviewed following labs and imaging studies  CBC:  Recent Labs Lab 05/31/17 1813 06/01/17 1219  WBC 17.0* 18.2*  NEUTROABS  --  13.9*  HGB 15.9 16.2  HCT 48.2 47.0  MCV 98.4 95.9  PLT 320 492   Basic Metabolic Panel:  Recent Labs Lab 05/31/17 1813 06/01/17 1219  NA 135 138  K 3.9 4.0  CL 100* 100*  CO2 24 27  GLUCOSE 160* 124*  BUN 11 16  CREATININE 0.99 0.96  CALCIUM 9.4 9.3   GFR: CrCl cannot be calculated (Unknown ideal weight.). Liver Function Tests:  Recent Labs Lab 06/01/17 1219  AST 38  ALT 47  ALKPHOS 98  BILITOT 0.9   PROT 7.6  ALBUMIN 3.4*   Cardiac Enzymes:  Recent Labs Lab 06/01/17 1354  TROPONINI <0.03   CBG:  Recent Labs Lab 06/01/17 1221  GLUCAP 113*   Urine analysis:    Component Value Date/Time   COLORURINE AMBER (A) 06/01/2017 1301   APPEARANCEUR CLEAR 06/01/2017 1301   LABSPEC 1.026 06/01/2017 1301   PHURINE 5.0 06/01/2017 1301   GLUCOSEU NEGATIVE 06/01/2017 1301   HGBUR NEGATIVE 06/01/2017 1301   BILIRUBINUR NEGATIVE 06/01/2017 1301   KETONESUR NEGATIVE 06/01/2017 1301   PROTEINUR 30 (A) 06/01/2017 1301   UROBILINOGEN 0.2 06/24/2015 2257   NITRITE NEGATIVE 06/01/2017 1301   LEUKOCYTESUR  NEGATIVE 06/01/2017 1301   Sepsis Labs:  Lactic acid level 1.4, repeat 0.9  Radiological Exams on Admission: Dg Chest 2 View  Result Date: 06/01/2017 CLINICAL DATA:  Altered mental status, dementia, slight drooping on the right EXAM: CHEST  2 VIEW COMPARISON:  Chest x-ray of 05/31/2017 FINDINGS: No active infiltrate or effusion is seen. Mediastinal and hilar contours are unremarkable. The heart is within normal limits in size. No acute bony abnormality is seen. IMPRESSION: No active cardiopulmonary disease. Electronically Signed   By: Ivar Drape M.D.   On: 06/01/2017 14:42   Dg Chest 2 View  Result Date: 05/31/2017 CLINICAL DATA:  Fever and weakness. EXAM: CHEST  2 VIEW COMPARISON:  06/24/2015 chest radiograph FINDINGS: The cardiomediastinal silhouette is unremarkable. Mild bibasilar atelectasis noted. There is no evidence of focal airspace disease, pulmonary edema, suspicious pulmonary nodule/mass, pleural effusion, or pneumothorax. No acute bony abnormalities are identified. IMPRESSION: Mild basilar atelectasis. Electronically Signed   By: Margarette Canada M.D.   On: 05/31/2017 18:54   Ct Head Wo Contrast  Result Date: 06/01/2017 CLINICAL DATA:  Increased sleeping EXAM: CT HEAD WITHOUT CONTRAST TECHNIQUE: Contiguous axial images were obtained from the base of the skull through the vertex  without intravenous contrast. COMPARISON:  None. FINDINGS: Brain: There is severe global atrophy. Minimal chronic ischemic changes in the periventricular white matter are noted. No mass effect, midline shift, or acute hemorrhage. Vascular: No hyperdense vessel or unexpected calcification. Skull: Cranium is intact. Sinuses/Orbits: Mastoid air cells are clear. Visualized paranasal sinuses are clear. Other: Noncontributory IMPRESSION: No acute intracranial pathology. Electronically Signed   By: Marybelle Killings M.D.   On: 06/01/2017 17:00   Ct Abdomen Pelvis W Contrast  Result Date: 06/01/2017 CLINICAL DATA:  66 year old male with right abdominal and pelvic pain for several days. EXAM: CT ABDOMEN AND PELVIS WITH CONTRAST TECHNIQUE: Multidetector CT imaging of the abdomen and pelvis was performed using the standard protocol following bolus administration of intravenous contrast. CONTRAST:  123mL ISOVUE-300 IOPAMIDOL (ISOVUE-300) INJECTION 61% COMPARISON:  None. FINDINGS: Lower chest: Mild bibasilar atelectasis/ scarring noted. Coronary artery calcifications are present. Hepatobiliary: The liver and gallbladder are unremarkable. There is no evidence of biliary dilatation. Pancreas: Unremarkable Spleen: Unremarkable Adrenals/Urinary Tract: The kidneys, adrenal glands and bladder are unremarkable except for bilateral renal parapelvic cysts. Stomach/Bowel: A thickened inflamed appendix is noted with adjacent 5.2 x 6 x 7 cm complex fluid and inflammation in the lateral right lower abdomen abutting the abdominal musculature and compatible with ruptured appendicitis and ill-defined/developing abscess. There is no evidence of bowel obstruction or pneumoperitoneum. Colonic diverticulosis noted without evidence of diverticulitis. Vascular/Lymphatic: Aortic atherosclerosis. No enlarged abdominal or pelvic lymph nodes. Reproductive: Prostate unremarkable Other: No ascites. A moderate right inguinal hernia containing fat is noted.  Musculoskeletal: No acute or suspicious abnormality. IMPRESSION: Ruptured appendicitis with adjacent 5.2 x 6 x 7 cm ill-defined/developing abscess. No evidence of pneumoperitoneum or bowel obstruction. Coronary artery disease. Aortic Atherosclerosis (ICD10-I70.0). Moderate right inguinal hernia containing fat. Results were called by telephone at the time of interpretation on 06/01/2017 at 5:07 pm to [facility] , who verbally acknowledged these results. Electronically Signed   By: Margarette Canada M.D.   On: 06/01/2017 17:10    EKG: Independently reviewed. NSR.  LAFB (previously identified).  Assessment/Plan Principal Problem:   Sepsis ([place]) Active Problems:   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[place])   History of pulmonary embolism   Polycythemia vera ([place])   Ruptured appendix   Abscess of appendix  Agitation      Sepsis secondary to ruptured appendix with abscess --General surgery consultation appreciated --Continue IV zosyn --Blood cultures are pending --Hold Xarelto in anticipation of surgical intervention --NPO for now --Anti-emetics and analgesics as needed --NS at 100cc/hr --Repeat CBC in the AM  Agitation with history of dementia due to TBI and/or EtOH --PRN ativan --Fall and aspiration precautions  History of EtOH use --IV thiamine and folate until tolerating PO  History of PE --Holding Xarelto for now in anticipation of surgical intervention as noted above     DVT prophylaxis: Lovenox Code Status: FULL Family Communication: Wife and son present in the ED at time of admission. Disposition Plan: To be determined. Consults called: General Surgery Lucia Gaskins) Admission status: Inpatient, stepdown unit.  I expect this patient will need inpatient services for greater than two midnights.   TIME SPENT: 60 minutes   Eber Jones MD [facility] Pager [phone]  If 7PM-7AM, please contact night-coverage [email or web address] Password [email or web address]  06/01/2017,  8:06 PM

## 2017-06-01 NOTE — Care Management (Signed)
ED CM reviewed CM consult. Patient is being admitted. Ermal Haberer RN CCM 

## 2017-06-01 NOTE — ED Notes (Signed)
Patients wife Terri home number [phone], cell phone [phone]

## 2017-06-01 NOTE — ED Triage Notes (Signed)
Pt with Hx of dementia, brought by wife for tremor, increased sleeping, no appetite, no BM x several days, confusion, right arm pain with getting dressed onset this weekend. Yesterday pt had difficulty getting up, stayed asleep slumped over yesterday. Today pt could not get out of bed independently, which is unusual for patient. Family reports slowly increasing confusion x few weeks. Fell on Wednesday, hit coffee table, no head injury or LOC at that time, did not seem injured. Had testing done at [facility] ED last night but LWBS. Point tenderness to right lateral ribcage with palpation. No point tenderness or pain with ROM of right arm noted on exam, although pt's dementia makes testing difficult.

## 2017-06-01 NOTE — ED Notes (Signed)
rn at bedside collecting labs 

## 2017-06-02 ENCOUNTER — Inpatient Hospital Stay (HOSPITAL_COMMUNITY): Payer: Medicare Other

## 2017-06-02 DIAGNOSIS — K352 Acute appendicitis with generalized peritonitis: Secondary | ICD-10-CM

## 2017-06-02 DIAGNOSIS — F1027 Alcohol dependence with alcohol-induced persisting dementia: Secondary | ICD-10-CM

## 2017-06-02 LAB — CBC
HCT: 41 % (ref 39.0–52.0)
Hemoglobin: 14.1 g/dL (ref 13.0–17.0)
MCH: 32.3 pg (ref 26.0–34.0)
MCHC: 34.4 g/dL (ref 30.0–36.0)
MCV: 94 fL (ref 78.0–100.0)
PLATELETS: 251 10*3/uL (ref 150–400)
RBC: 4.36 MIL/uL (ref 4.22–5.81)
RDW: 13.6 % (ref 11.5–15.5)
WBC: 15.9 10*3/uL — ABNORMAL HIGH (ref 4.0–10.5)

## 2017-06-02 LAB — BASIC METABOLIC PANEL
Anion gap: 11 (ref 5–15)
BUN: 13 mg/dL (ref 6–20)
CALCIUM: 8.3 mg/dL — AB (ref 8.9–10.3)
CO2: 21 mmol/L — ABNORMAL LOW (ref 22–32)
CREATININE: 0.89 mg/dL (ref 0.61–1.24)
Chloride: 105 mmol/L (ref 101–111)
GFR calc Af Amer: >60 mL/min (ref >60)
GLUCOSE: 107 mg/dL — AB (ref 65–99)
POTASSIUM: 3.5 mmol/L (ref 3.5–5.1)
SODIUM: 137 mmol/L (ref 135–145)

## 2017-06-02 LAB — SURGICAL PCR SCREEN
MRSA, PCR: NEGATIVE
Staphylococcus aureus: NEGATIVE

## 2017-06-02 MED ORDER — MIDAZOLAM HCL 2 MG/2ML IJ SOLN
INTRAMUSCULAR | Status: AC
  Filled 2017-06-02: qty 4

## 2017-06-02 MED ORDER — LIDOCAINE HCL 1 % IJ SOLN
INTRAMUSCULAR | Status: AC | PRN
  Administered 2017-06-02: 10 mL

## 2017-06-02 MED ORDER — MIDAZOLAM HCL 2 MG/2ML IJ SOLN
INTRAMUSCULAR | Status: AC | PRN
  Administered 2017-06-02 (×2): 1 mg via INTRAVENOUS

## 2017-06-02 MED ORDER — FENTANYL CITRATE (PF) 100 MCG/2ML IJ SOLN
INTRAMUSCULAR | Status: AC | PRN
  Administered 2017-06-02 (×2): 50 ug via INTRAVENOUS

## 2017-06-02 MED ORDER — FENTANYL CITRATE (PF) 100 MCG/2ML IJ SOLN
INTRAMUSCULAR | Status: AC
  Filled 2017-06-02: qty 4

## 2017-06-02 NOTE — Progress Notes (Signed)
Patient admitted to Room 1433 from ED. Alert and oriented to self, disoriented to time, place and situation. Tele placed, VSS. Call bell placed within reach. Bed alarm on.

## 2017-06-02 NOTE — Progress Notes (Signed)
[facility] Progress Note     Subjective: CC: AMS Per family patient has not been vomiting. Last good BM was 6/16. Normally has daily BM. Discussed plan for possible perc drain placement today. Family expressed concern that he may try to pull out drain. Afebrile, BP soft this AM.   Objective: Vital signs in last 24 hours: Temp:  [98.3 F (36.8 C)-101.5 F (38.6 C)] 99.3 F (37.4 C) (06/27 0948) Pulse Rate:  [62-90] 68 (06/27 0948) Resp:  [14-24] 19 (06/27 0948) BP: (88-125)/(64-97) 115/73 (06/27 0948) SpO2:  [92 %-100 %] 100 % (06/27 0948) Weight:  [71.9 kg (158 lb 8.2 oz)-75.3 kg (166 lb)] 71.9 kg (158 lb 8.2 oz) (06/27 0948)    Intake/Output from previous day: 06/26 0701 - 06/27 0700 In: 2050 [IV Piggyback:2050] Out: -  Intake/Output this shift: No intake/output data recorded.  PE: Gen:  NAD, pleasant Card:  Regular rate and rhythm, radial pulses 2+ BL Pulm:  Normal effort, clear to auscultation bilaterally Abd: Soft, some dullness on percussion of RLQ, rest of abdomen tympanic. TTP RLQ, non-distended, bowel sounds present in all 4 quadrants, no HSM. Small right inguinal hernia.  Skin: warm and dry, no rashes  Psych: A&O to self only.   Lab Results:   Recent Labs  06/01/17 1219 06/02/17 0723  WBC 18.2* 15.9*  HGB 16.2 14.1  HCT 47.0 41.0  PLT 276 251   BMET  Recent Labs  06/01/17 1219 06/02/17 0723  NA 138 137  K 4.0 3.5  CL 100* 105  CO2 27 21*  GLUCOSE 124* 107*  BUN 16 13  CREATININE 0.96 0.89  CALCIUM 9.3 8.3*   PT/INR  Recent Labs  06/01/17 2147  LABPROT 17.3*  INR 1.40   CMP     Component Value Date/Time   NA 137 06/02/2017 0723   NA 141 07/24/2016 1525   K 3.5 06/02/2017 0723   K 4.4 07/24/2016 1525   CL 105 06/02/2017 0723   CO2 21 (L) 06/02/2017 0723   CO2 27 07/24/2016 1525   GLUCOSE 107 (H) 06/02/2017 0723   GLUCOSE 95 07/24/2016 1525   BUN 13 06/02/2017 0723   BUN 14.8 07/24/2016 1525   CREATININE 0.89  06/02/2017 0723   CREATININE 0.9 07/24/2016 1525   CALCIUM 8.3 (L) 06/02/2017 0723   CALCIUM 10.0 07/24/2016 1525   PROT 7.6 06/01/2017 1219   PROT 7.4 07/24/2016 1525   ALBUMIN 3.4 (L) 06/01/2017 1219   ALBUMIN 3.9 07/24/2016 1525   AST 38 06/01/2017 1219   AST 20 07/24/2016 1525   ALT 47 06/01/2017 1219   ALT 17 07/24/2016 1525   ALKPHOS 98 06/01/2017 1219   ALKPHOS 61 07/24/2016 1525   BILITOT 0.9 06/01/2017 1219   BILITOT 0.49 07/24/2016 1525   GFRNONAA >60 06/02/2017 0723   GFRAA >60 06/02/2017 0723     Studies/Results: Dg Chest 2 View  Result Date: 06/01/2017 CLINICAL DATA:  Altered mental status, dementia, slight drooping on the right EXAM: CHEST  2 VIEW COMPARISON:  Chest x-ray of 05/31/2017 FINDINGS: No active infiltrate or effusion is seen. Mediastinal and hilar contours are unremarkable. The heart is within normal limits in size. No acute bony abnormality is seen. IMPRESSION: No active cardiopulmonary disease. Electronically Signed   By: Ivar Drape M.D.   On: 06/01/2017 14:42   Dg Chest 2 View  Result Date: 05/31/2017 CLINICAL DATA:  Fever and weakness. EXAM: CHEST  2 VIEW COMPARISON:  06/24/2015 chest radiograph FINDINGS: The  cardiomediastinal silhouette is unremarkable. Mild bibasilar atelectasis noted. There is no evidence of focal airspace disease, pulmonary edema, suspicious pulmonary nodule/mass, pleural effusion, or pneumothorax. No acute bony abnormalities are identified. IMPRESSION: Mild basilar atelectasis. Electronically Signed   By: Margarette Canada M.D.   On: 05/31/2017 18:54   Ct Head Wo Contrast  Result Date: 06/01/2017 CLINICAL DATA:  Increased sleeping EXAM: CT HEAD WITHOUT CONTRAST TECHNIQUE: Contiguous axial images were obtained from the base of the skull through the vertex without intravenous contrast. COMPARISON:  None. FINDINGS: Brain: There is severe global atrophy. Minimal chronic ischemic changes in the periventricular white matter are noted. No mass  effect, midline shift, or acute hemorrhage. Vascular: No hyperdense vessel or unexpected calcification. Skull: Cranium is intact. Sinuses/Orbits: Mastoid air cells are clear. Visualized paranasal sinuses are clear. Other: Noncontributory IMPRESSION: No acute intracranial pathology. Electronically Signed   By: Marybelle Killings M.D.   On: 06/01/2017 17:00   Ct Abdomen Pelvis W Contrast  Result Date: 06/01/2017 CLINICAL DATA:  67 year old male with right abdominal and pelvic pain for several days. EXAM: CT ABDOMEN AND PELVIS WITH CONTRAST TECHNIQUE: Multidetector CT imaging of the abdomen and pelvis was performed using the standard protocol following bolus administration of intravenous contrast. CONTRAST:  116mL ISOVUE-300 IOPAMIDOL (ISOVUE-300) INJECTION 61% COMPARISON:  None. FINDINGS: Lower chest: Mild bibasilar atelectasis/ scarring noted. Coronary artery calcifications are present. Hepatobiliary: The liver and gallbladder are unremarkable. There is no evidence of biliary dilatation. Pancreas: Unremarkable Spleen: Unremarkable Adrenals/Urinary Tract: The kidneys, adrenal glands and bladder are unremarkable except for bilateral renal parapelvic cysts. Stomach/Bowel: A thickened inflamed appendix is noted with adjacent 5.2 x 6 x 7 cm complex fluid and inflammation in the lateral right lower abdomen abutting the abdominal musculature and compatible with ruptured appendicitis and ill-defined/developing abscess. There is no evidence of bowel obstruction or pneumoperitoneum. Colonic diverticulosis noted without evidence of diverticulitis. Vascular/Lymphatic: Aortic atherosclerosis. No enlarged abdominal or pelvic lymph nodes. Reproductive: Prostate unremarkable Other: No ascites. A moderate right inguinal hernia containing fat is noted. Musculoskeletal: No acute or suspicious abnormality. IMPRESSION: Ruptured appendicitis with adjacent 5.2 x 6 x 7 cm ill-defined/developing abscess. No evidence of pneumoperitoneum or  bowel obstruction. Coronary artery disease. Aortic Atherosclerosis (ICD10-I70.0). Moderate right inguinal hernia containing fat. Results were called by telephone at the time of interpretation on 06/01/2017 at 5:07 pm to [facility] , who verbally acknowledged these results. Electronically Signed   By: Margarette Canada M.D.   On: 06/01/2017 17:10    Anti-infectives: Anti-infectives    Start     Dose/Rate Route Frequency Ordered Stop   06/02/17 0000  piperacillin-tazobactam (ZOSYN) IVPB 3.375 g     3.375 g 12.5 mL/hr over 240 Minutes Intravenous Every 8 hours 06/01/17 2103     06/01/17 1745  piperacillin-tazobactam (ZOSYN) IVPB 3.375 g     3.375 g 100 mL/hr over 30 Minutes Intravenous  Once 06/01/17 1730 06/01/17 1839   06/01/17 1730  piperacillin-tazobactam (ZOSYN) IVPB 4.5 g  Status:  Discontinued     4.5 g 200 mL/hr over 30 Minutes Intravenous  Once 06/01/17 1728 06/01/17 1730       Assessment/Plan Probable ruptured appendicitis w/ abscess - IV abx, IVF, NPO - IR perc drain planned for today - may need abdominal binder and/or mittens to keep patient from pulling drain out once placed - WBC 15.9, lactic acid 0.9 last night. 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 Alvy Bimler  FEN - NPO VTE - SCDs ID - IV Zosyn (6/26>>)  Plan: IR to place percutaneous drain hopefully today. Continue IV abx. We will continue to follow.   LOS: 1 day    Brigid Re , [facility] 06/02/2017, 9:58 AM Pager: [phone] Consults: [phone] Mon-Fri 7:00 am-4:30 pm Sat-Sun 7:00 am-11:30 am

## 2017-06-02 NOTE — Consult Note (Signed)
Chief Complaint: Patient was seen in consultation today for CT-guided drainage of appendiceal abscess Chief Complaint  Patient presents with  . Altered Mental Status    Referring Physician(s): Rosenbower,T  Supervising Physician: Arne Cleveland  Patient Status: [facility] - In-pt  History of Present Illness: John [name] is a 66 y.o. male with a history of polycythemia vera (managed with intermittent phlebotomy by Dr. 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 He has a history of fall which occurred last Wednesday at home with no reported head injury or loss of consciousness. He has urinary continence at baseline. On admission patient was noted to be febrile with leukocytosis. CT abdomen and pelvis revealed a ruptured appendicitis with adjacent 7 cm developing abscess. Request now received from surgery for image guided drainage of the appendiceal abscess.  Past Medical History:  Diagnosis Date  . Adenomatous colon polyp   . Alcohol dependence ([place])   . Ataxia 07/05/2013  . Conjunctivitis, allergic   . 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 Hyperlipemia   . Hypertension   . Memory loss    dementia was in question in 2004  . Peyronie's disease    presumptive,resolved  . Polycythemia vera ([place]) 11/07/2015  . Vitamin B 12 deficiency     Past Surgical History:  Procedure Laterality Date  . FRACTURE SURGERY  1997   leg  . INGUINAL HERNIA REPAIR Bilateral    w/ mesh  . TONSILLECTOMY  1973    Allergies: Patient has no known allergies.  Medications: Prior to Admission medications   Medication Sig Start Date End Date Taking? Authorizing Provider  B COMPLEX VITAMINS PO Take by mouth 3 (three) times  daily.   Yes [provider]  folic acid (FOLVITE) 1 MG tablet Take 1 tablet (1 mg total) by mouth daily. 06/25/15  Yes Thurnell Lose, MD  loratadine (CLARITIN) 10 MG tablet Take 10 mg by mouth daily.   Yes [provider]  rivaroxaban (XARELTO) 20 MG TABS tablet Take 1 tablet (20 mg total) by mouth daily with supper. 09/18/16  Yes Gorsuch, Ni, MD  thiamine 100 MG tablet Take 1 tablet (100 mg total) by mouth daily. 06/25/15  Yes Thurnell Lose, MD  vitamin B-12 (CYANOCOBALAMIN) 1000 MCG tablet Take 1,000 mcg by mouth daily.   Yes [provider]     Family History  Problem Relation Age of Onset  . Glaucoma Mother   . Stroke Father   . Clotting disorder Father   . Clotting disorder Sister     Social History   Social History  . Marital status: Married    Spouse name: N/A  . Number of children: 2  . Years of education: N/A   Occupational History  . [employer]    Social History Main Topics  . Smoking status: Former Smoker    Quit date: 12/07/1972  . Smokeless tobacco: Never Used  . Alcohol use 1.8 oz/week    3 Glasses of wine per week     Comment: 2 glasses of wine daily  . Drug use: No  . Sexual activity: Yes   Other Topics Concern  . None   Social History Narrative  . None      Review of Systems see above; patient  with progressive dementia. History obtained from spouse; no significant chest pain, dyspnea, nausea, vomiting or bleeding.  Vital Signs: BP 115/73 (BP Location: Left Arm)   Pulse 68   Temp 99.3 F (37.4 C) (Oral)   Resp 19   Ht 5\' 10"  (1.778 m)   Wt 158 lb 8.2 oz (71.9 kg)   SpO2 100%   BMI 22.74 kg/m   Physical Exam patient awake but obviously confused. Chest clear to auscultation bilaterally. Heart with regular rate and rhythm. Abdomen soft, few bowel sounds, moderately tender right lower quadrant, small right inguinal hernia; no lower extremity edema  Mallampati Score:     Imaging: Dg Chest 2  View  Result Date: 06/01/2017 CLINICAL DATA:  Altered mental status, dementia, slight drooping on the right EXAM: CHEST  2 VIEW COMPARISON:  Chest x-ray of 05/31/2017 FINDINGS: No active infiltrate or effusion is seen. Mediastinal and hilar contours are unremarkable. The heart is within normal limits in size. No acute bony abnormality is seen. IMPRESSION: No active cardiopulmonary disease. Electronically Signed   By: Ivar Drape M.D.   On: 06/01/2017 14:42   Dg Chest 2 View  Result Date: 05/31/2017 CLINICAL DATA:  Fever and weakness. EXAM: CHEST  2 VIEW COMPARISON:  06/24/2015 chest radiograph FINDINGS: The cardiomediastinal silhouette is unremarkable. Mild bibasilar atelectasis noted. There is no evidence of focal airspace disease, pulmonary edema, suspicious pulmonary nodule/mass, pleural effusion, or pneumothorax. No acute bony abnormalities are identified. IMPRESSION: Mild basilar atelectasis. Electronically Signed   By: Margarette Canada M.D.   On: 05/31/2017 18:54   Ct Head Wo Contrast  Result Date: 06/01/2017 CLINICAL DATA:  Increased sleeping EXAM: CT HEAD WITHOUT CONTRAST TECHNIQUE: Contiguous axial images were obtained from the base of the skull through the vertex without intravenous contrast. COMPARISON:  None. FINDINGS: Brain: There is severe global atrophy. Minimal chronic ischemic changes in the periventricular white matter are noted. No mass effect, midline shift, or acute hemorrhage. Vascular: No hyperdense vessel or unexpected calcification. Skull: Cranium is intact. Sinuses/Orbits: Mastoid air cells are clear. Visualized paranasal sinuses are clear. Other: Noncontributory IMPRESSION: No acute intracranial pathology. Electronically Signed   By: Marybelle Killings M.D.   On: 06/01/2017 17:00   Ct Abdomen Pelvis W Contrast  Result Date: 06/01/2017 CLINICAL DATA:  66 year old male with right abdominal and pelvic pain for several days. EXAM: CT ABDOMEN AND PELVIS WITH CONTRAST TECHNIQUE: Multidetector  CT imaging of the abdomen and pelvis was performed using the standard protocol following bolus administration of intravenous contrast. CONTRAST:  140mL ISOVUE-300 IOPAMIDOL (ISOVUE-300) INJECTION 61% COMPARISON:  None. FINDINGS: Lower chest: Mild bibasilar atelectasis/ scarring noted. Coronary artery calcifications are present. Hepatobiliary: The liver and gallbladder are unremarkable. There is no evidence of biliary dilatation. Pancreas: Unremarkable Spleen: Unremarkable Adrenals/Urinary Tract: The kidneys, adrenal glands and bladder are unremarkable except for bilateral renal parapelvic cysts. Stomach/Bowel: A thickened inflamed appendix is noted with adjacent 5.2 x 6 x 7 cm complex fluid and inflammation in the lateral right lower abdomen abutting the abdominal musculature and compatible with ruptured appendicitis and ill-defined/developing abscess. There is no evidence of bowel obstruction or pneumoperitoneum. Colonic diverticulosis noted without evidence of diverticulitis. Vascular/Lymphatic: Aortic atherosclerosis. No enlarged abdominal or pelvic lymph nodes. Reproductive: Prostate unremarkable Other: No ascites. A moderate right inguinal hernia containing fat is noted. Musculoskeletal: No acute or suspicious abnormality. IMPRESSION: Ruptured appendicitis with adjacent 5.2 x 6 x 7 cm ill-defined/developing abscess. No evidence of pneumoperitoneum or bowel obstruction. Coronary artery disease. Aortic Atherosclerosis (ICD10-I70.0).  Moderate right inguinal hernia containing fat. Results were called by telephone at the time of interpretation on 06/01/2017 at 5:07 pm to [facility] , who verbally acknowledged these results. Electronically Signed   By: Margarette Canada M.D.   On: 06/01/2017 17:10    Labs:  CBC:  Recent Labs  05/21/17 1543 05/31/17 1813 06/01/17 1219 06/02/17 0723  WBC 11.5* 17.0* 18.2* 15.9*  HGB 15.8 15.9 16.2 14.1  HCT 46.5 48.2 47.0 41.0  PLT 278 320 276 251    COAGS:  Recent  Labs  06/01/17 2147  INR 1.40    BMP:  Recent Labs  07/24/16 1525 05/31/17 1813 06/01/17 1219 06/02/17 0723  NA 141 135 138 137  K 4.4 3.9 4.0 3.5  CL  --  100* 100* 105  CO2 27 24 27  21*  GLUCOSE 95 160* 124* 107*  BUN 14.8 11 16 13   CALCIUM 10.0 9.4 9.3 8.3*  CREATININE 0.9 0.99 0.96 0.89  GFRNONAA  --  >60 >60 >60  GFRAA  --  >60 >60 >60    LIVER FUNCTION TESTS:  Recent Labs  07/24/16 1525 06/01/17 1219  BILITOT 0.49 0.9  AST 20 38  ALT 17 47  ALKPHOS 61 98  PROT 7.4 7.6  ALBUMIN 3.9 3.4*    TUMOR MARKERS: No results for input(s): AFPTM, CEA, CA199, CHROMGRNA in the last 8760 hours.  Assessment and Plan: 66 y.o. male with a history of polycythemia vera (managed with intermittent phlebotomy by Dr. 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 He has a history of fall which occurred last Wednesday at home with no reported head injury or loss of consciousness. He has urinary continence at baseline. On admission patient was noted to be febrile with leukocytosis. CT abdomen and pelvis revealed a ruptured appendicitis with adjacent 7 cm developing abscess. Request now received from surgery for image guided drainage of the appendiceal abscess. Imaging studies of been reviewed by Dr. Vernard Gambles.Risks and benefits discussed with the patient's wife/family including bleeding, infection, damage to adjacent structures, bowel perforation/fistula connection, and sepsis.All of the patient's questions were answered, patient is agreeable to proceed.Consent signed and in chart. Procedure tent scheduled  for later this afternoon.      Thank you for this interesting consult.  I greatly enjoyed meeting [employer]  and look forward to participating in their care.  A copy of this report was sent to the requesting provider on this date.  Electronically Signed: D. Rowe Robert, PA-C 06/02/2017, 11:01 AM   I spent a total of 30 minutes    in face to face in clinical consultation, greater than 50% of which was counseling/coordinating care for CT-guided drainage of appendiceal abscess

## 2017-06-02 NOTE — Procedures (Signed)
  CT guided RLQ abscess drain catheter placement 79ml purulent aspirate sent for GS< C&S No complication No blood loss. See complete dictation in [facility].  Dillard Cannon MD Main # [phone] Pager  [phone]

## 2017-06-02 NOTE — Progress Notes (Signed)
PROGRESS NOTE    John [name]  ION:[id number] DOB: 08/30/1951 DOA: 06/01/2017 PCP: Cari Caraway, MD   Brief Narrative: John [name] is a 66 y.o. 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 Patient presented with abdominal pain, decrease oral intake, progressive weakness. He is found to have appendicitis with perforation and abscess. General surgery consulted for evaluation. Recommending IR percutaneous drain.   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 Sepsis physiology improved with antibiotics. -General surgery recommendations: Consult IR for percutaneous drain -Continue Zosyn -Nothing by mouth pending drain placement -Continue IV fluids  Dementia Patient is currently not agitated. -Continue Ativan when necessary  History of PE Patient on Xarelto as an outpatient. This was held on admission. Started on Lovenox VTE prophylaxis dose -transition to heparin pending percutaneous drain placement   DVT prophylaxis: Lovenox, plan to switch to full anticoagulation Code Status: Full code Family Communication: Wife, son and daughter-in-law at bedside Disposition Plan: Discharge pending management of appendicitis   Consultants:   General surgery  Interventional radiology  Procedures:   None  Antimicrobials:  Zosyn    Subjective: Patient with dementia. Reports no pain.  Objective: Vitals:   06/02/17 0745 06/02/17 0830 06/02/17 0851 06/02/17 0948  BP:   109/71 115/73  Pulse: 64 62 69 68  Resp: 19 17 18 19   Temp:    99.3 F (37.4 C)  TempSrc:    Oral  SpO2: 95% 95% 99% 100%  Weight:    71.9 kg (158 lb 8.2  oz)  Height:    5\' 10"  (1.778 m)    Intake/Output Summary (Last 24 hours) at 06/02/17 1317 Last data filed at 06/01/17 2151  Gross per 24 hour  Intake             2050 ml  Output                0 ml  Net             2050 ml   Filed Weights   06/01/17 2048 06/02/17 0948  Weight: 75.3 kg (166 lb) 71.9 kg (158 lb 8.2 oz)    Examination:  General exam: Appears calm and comfortable Respiratory system: Clear to auscultation. Respiratory effort normal. Cardiovascular system: S1 & S2 heard, RRR. No murmurs, rubs, gallops or clicks. Gastrointestinal system: Abdomen is nondistended, soft and tender especially in RLQ. Normal bowel sounds heard. Central nervous system: Alert and not oriented. No focal neurological deficits. Follows commands. Extremities: No edema. No calf tenderness Skin: No cyanosis. No rashes Psychiatry: Judgement and insight impaired. Mood & affect appropriate.     Data Reviewed: I have personally reviewed following labs and imaging studies  CBC:  Recent Labs Lab 05/31/17 1813 06/01/17 1219 06/02/17 0723  WBC 17.0* 18.2* 15.9*  NEUTROABS  --  13.9*  --   HGB 15.9 16.2 14.1  HCT 48.2 47.0 41.0  MCV 98.4 95.9 94.0  PLT 320 276 244   Basic Metabolic Panel:  Recent Labs Lab 05/31/17 1813 06/01/17 1219 06/02/17 0723  NA 135 138 137  K 3.9 4.0 3.5  CL 100* 100* 105  CO2  24 27 21*  GLUCOSE 160* 124* 107*  BUN 11 16 13   CREATININE 0.99 0.96 0.89  CALCIUM 9.4 9.3 8.3*   GFR: Estimated Creatinine Clearance: 83 mL/min (by C-G formula based on SCr of 0.89 mg/dL). Liver Function Tests:  Recent Labs Lab 06/01/17 1219  AST 38  ALT 47  ALKPHOS 98  BILITOT 0.9  PROT 7.6  ALBUMIN 3.4*   No results for input(s): LIPASE, AMYLASE in the last 168 hours. No results for input(s): AMMONIA in the last 168 hours. Coagulation Profile:  Recent Labs Lab 06/01/17 2147  INR 1.40   Cardiac Enzymes:  Recent Labs Lab 06/01/17 1354  TROPONINI <0.03   BNP  (last 3 results) No results for input(s): PROBNP in the last 8760 hours. HbA1C: No results for input(s): HGBA1C in the last 72 hours. CBG:  Recent Labs Lab 06/01/17 1221  GLUCAP 113*   Lipid Profile: No results for input(s): CHOL, HDL, LDLCALC, TRIG, CHOLHDL, LDLDIRECT in the last 72 hours. Thyroid Function Tests: No results for input(s): TSH, T4TOTAL, FREET4, T3FREE, THYROIDAB in the last 72 hours. Anemia Panel: No results for input(s): VITAMINB12, FOLATE, FERRITIN, TIBC, IRON, RETICCTPCT in the last 72 hours. Sepsis Labs:  Recent Labs Lab 06/01/17 1358 06/01/17 1658 06/01/17 2147  PROCALCITON  --   --  0.42  LATICACIDVEN 1.4 0.9  --     No results found for this or any previous visit (from the past 240 hour(s)).       Radiology Studies: Dg Chest 2 View  Result Date: 06/01/2017 CLINICAL DATA:  Altered mental status, dementia, slight drooping on the right EXAM: CHEST  2 VIEW COMPARISON:  Chest x-ray of 05/31/2017 FINDINGS: No active infiltrate or effusion is seen. Mediastinal and hilar contours are unremarkable. The heart is within normal limits in size. No acute bony abnormality is seen. IMPRESSION: No active cardiopulmonary disease. Electronically Signed   By: Ivar Drape M.D.   On: 06/01/2017 14:42   Dg Chest 2 View  Result Date: 05/31/2017 CLINICAL DATA:  Fever and weakness. EXAM: CHEST  2 VIEW COMPARISON:  06/24/2015 chest radiograph FINDINGS: The cardiomediastinal silhouette is unremarkable. Mild bibasilar atelectasis noted. There is no evidence of focal airspace disease, pulmonary edema, suspicious pulmonary nodule/mass, pleural effusion, or pneumothorax. No acute bony abnormalities are identified. IMPRESSION: Mild basilar atelectasis. Electronically Signed   By: Margarette Canada M.D.   On: 05/31/2017 18:54   Ct Head Wo Contrast  Result Date: 06/01/2017 CLINICAL DATA:  Increased sleeping EXAM: CT HEAD WITHOUT CONTRAST TECHNIQUE: Contiguous axial images were obtained  from the base of the skull through the vertex without intravenous contrast. COMPARISON:  None. FINDINGS: Brain: There is severe global atrophy. Minimal chronic ischemic changes in the periventricular white matter are noted. No mass effect, midline shift, or acute hemorrhage. Vascular: No hyperdense vessel or unexpected calcification. Skull: Cranium is intact. Sinuses/Orbits: Mastoid air cells are clear. Visualized paranasal sinuses are clear. Other: Noncontributory IMPRESSION: No acute intracranial pathology. Electronically Signed   By: Marybelle Killings M.D.   On: 06/01/2017 17:00   Ct Abdomen Pelvis W Contrast  Result Date: 06/01/2017 CLINICAL DATA:  66 year old male with right abdominal and pelvic pain for several days. EXAM: CT ABDOMEN AND PELVIS WITH CONTRAST TECHNIQUE: Multidetector CT imaging of the abdomen and pelvis was performed using the standard protocol following bolus administration of intravenous contrast. CONTRAST:  166mL ISOVUE-300 IOPAMIDOL (ISOVUE-300) INJECTION 61% COMPARISON:  None. FINDINGS: Lower chest: Mild bibasilar atelectasis/ scarring noted. Coronary artery calcifications are  present. Hepatobiliary: The liver and gallbladder are unremarkable. There is no evidence of biliary dilatation. Pancreas: Unremarkable Spleen: Unremarkable Adrenals/Urinary Tract: The kidneys, adrenal glands and bladder are unremarkable except for bilateral renal parapelvic cysts. Stomach/Bowel: A thickened inflamed appendix is noted with adjacent 5.2 x 6 x 7 cm complex fluid and inflammation in the lateral right lower abdomen abutting the abdominal musculature and compatible with ruptured appendicitis and ill-defined/developing abscess. There is no evidence of bowel obstruction or pneumoperitoneum. Colonic diverticulosis noted without evidence of diverticulitis. Vascular/Lymphatic: Aortic atherosclerosis. No enlarged abdominal or pelvic lymph nodes. Reproductive: Prostate unremarkable Other: No ascites. A moderate  right inguinal hernia containing fat is noted. Musculoskeletal: No acute or suspicious abnormality. IMPRESSION: Ruptured appendicitis with adjacent 5.2 x 6 x 7 cm ill-defined/developing abscess. No evidence of pneumoperitoneum or bowel obstruction. Coronary artery disease. Aortic Atherosclerosis (ICD10-I70.0). Moderate right inguinal hernia containing fat. Results were called by telephone at the time of interpretation on 06/01/2017 at 5:07 pm to [facility] , who verbally acknowledged these results. Electronically Signed   By: Margarette Canada M.D.   On: 06/01/2017 17:10        Scheduled Meds: . enoxaparin (LOVENOX) injection  40 mg Subcutaneous Q24H  . folic acid  1 mg Intravenous Daily  . thiamine  100 mg Intravenous Daily   Continuous Infusions: . sodium chloride 100 mL/hr at 06/01/17 1518  . famotidine (PEPCID) IV Stopped (06/02/17 1145)  . piperacillin-tazobactam (ZOSYN)  IV Stopped (06/02/17 1300)     LOS: 1 day     Cordelia Poche, MD [facility] 06/02/2017, 1:17 PM Pager: [phone]  If 7PM-7AM, please contact night-coverage [email or web address] Password [email or web address] 06/02/2017, 1:17 PM

## 2017-06-03 ENCOUNTER — Inpatient Hospital Stay (HOSPITAL_COMMUNITY): Payer: Medicare Other

## 2017-06-03 DIAGNOSIS — R1084 Generalized abdominal pain: Secondary | ICD-10-CM

## 2017-06-03 LAB — CBC WITH DIFFERENTIAL/PLATELET
BASOS PCT: 0 %
Basophils Absolute: 0 10*3/uL (ref 0.0–0.1)
EOS PCT: 0 %
Eosinophils Absolute: 0 10*3/uL (ref 0.0–0.7)
HEMATOCRIT: 41.2 % (ref 39.0–52.0)
Hemoglobin: 14.4 g/dL (ref 13.0–17.0)
Lymphocytes Relative: 10 %
Lymphs Abs: 2.1 10*3/uL (ref 0.7–4.0)
MCH: 32.4 pg (ref 26.0–34.0)
MCHC: 35 g/dL (ref 30.0–36.0)
MCV: 92.8 fL (ref 78.0–100.0)
MONO ABS: 1.1 10*3/uL — AB (ref 0.1–1.0)
MONOS PCT: 6 %
NEUTROS ABS: 16.6 10*3/uL — AB (ref 1.7–7.7)
Neutrophils Relative %: 84 %
Platelets: 252 10*3/uL (ref 150–400)
RBC: 4.44 MIL/uL (ref 4.22–5.81)
RDW: 13.6 % (ref 11.5–15.5)
WBC: 19.8 10*3/uL — ABNORMAL HIGH (ref 4.0–10.5)

## 2017-06-03 LAB — BASIC METABOLIC PANEL
Anion gap: 8 (ref 5–15)
BUN: 13 mg/dL (ref 6–20)
CALCIUM: 8.4 mg/dL — AB (ref 8.9–10.3)
CO2: 25 mmol/L (ref 22–32)
Chloride: 104 mmol/L (ref 101–111)
Creatinine, Ser: 0.97 mg/dL (ref 0.61–1.24)
GFR calc non Af Amer: >60 mL/min (ref >60)
GLUCOSE: 106 mg/dL — AB (ref 65–99)
Potassium: 3.6 mmol/L (ref 3.5–5.1)
Sodium: 137 mmol/L (ref 135–145)

## 2017-06-03 MED ORDER — BOOST / RESOURCE BREEZE PO LIQD
1.0000 | Freq: Three times a day (TID) | ORAL | Status: DC
  Administered 2017-06-03 – 2017-06-13 (×15): 1 via ORAL

## 2017-06-03 MED ORDER — MAGNESIUM HYDROXIDE 400 MG/5ML PO SUSP
30.0000 mL | Freq: Every day | ORAL | Status: DC
  Administered 2017-06-03 – 2017-06-09 (×6): 30 mL via ORAL
  Filled 2017-06-03 (×8): qty 30

## 2017-06-03 MED ORDER — IOPAMIDOL (ISOVUE-300) INJECTION 61%
100.0000 mL | Freq: Once | INTRAVENOUS | Status: AC | PRN
  Administered 2017-06-03: 100 mL via INTRAVENOUS

## 2017-06-03 MED ORDER — IOPAMIDOL (ISOVUE-300) INJECTION 61%
INTRAVENOUS | Status: AC
  Filled 2017-06-03: qty 100

## 2017-06-03 MED ORDER — MORPHINE SULFATE (PF) 4 MG/ML IV SOLN
2.0000 mg | INTRAVENOUS | Status: DC | PRN
  Administered 2017-06-06 – 2017-06-11 (×7): 2 mg via INTRAVENOUS
  Filled 2017-06-03 (×7): qty 1

## 2017-06-03 MED ORDER — BISACODYL 10 MG RE SUPP
10.0000 mg | Freq: Once | RECTAL | Status: AC
  Administered 2017-06-03: 10 mg via RECTAL
  Filled 2017-06-03: qty 1

## 2017-06-03 NOTE — Progress Notes (Signed)
Referring Physician(s): Rosenbower,T  Supervising Physician: Corrie Mckusick  Patient Status:  [facility] - In-pt  Chief Complaint:  Abdominal/appendiceal abscess  Subjective: Patient more lethargic this a.m; wife in room; has had some abd pain; spiked temp of 102 earlier, currently 99.3   Allergies: Patient has no known allergies.  Medications: Prior to Admission medications   Medication Sig Start Date End Date Taking? Authorizing Provider  B COMPLEX VITAMINS PO Take by mouth 3 (three) times daily.   Yes [provider]  folic acid (FOLVITE) 1 MG tablet Take 1 tablet (1 mg total) by mouth daily. 06/25/15  Yes Thurnell Lose, MD  loratadine (CLARITIN) 10 MG tablet Take 10 mg by mouth daily.   Yes [provider]  rivaroxaban (XARELTO) 20 MG TABS tablet Take 1 tablet (20 mg total) by mouth daily with supper. 09/18/16  Yes Gorsuch, Ni, MD  thiamine 100 MG tablet Take 1 tablet (100 mg total) by mouth daily. 06/25/15  Yes Thurnell Lose, MD  vitamin B-12 (CYANOCOBALAMIN) 1000 MCG tablet Take 1,000 mcg by mouth daily.   Yes [provider]     Vital Signs: BP 94/60 (BP Location: Left Arm)   Pulse 89   Temp 99.3 F (37.4 C) (Oral)   Resp 16   Ht 5\' 10"  (1.778 m)   Wt 158 lb 8.2 oz (71.9 kg)   SpO2 93%   BMI 22.74 kg/m   Physical Exam rt abd drain intact, some skin erythema around area noted, tender to palpation, output 35 cc purulent reddish- brown fluid; drain flushes ok; cx pend  Imaging: Dg Chest 2 View  Result Date: 06/01/2017 CLINICAL DATA:  Altered mental status, dementia, slight drooping on the right EXAM: CHEST  2 VIEW COMPARISON:  Chest x-ray of 05/31/2017 FINDINGS: No active infiltrate or effusion is seen. Mediastinal and hilar contours are unremarkable. The heart is within normal limits in size. No acute bony abnormality is seen. IMPRESSION: No active cardiopulmonary disease. Electronically Signed   By: Ivar Drape M.D.   On: 06/01/2017  14:42   Dg Chest 2 View  Result Date: 05/31/2017 CLINICAL DATA:  Fever and weakness. EXAM: CHEST  2 VIEW COMPARISON:  06/24/2015 chest radiograph FINDINGS: The cardiomediastinal silhouette is unremarkable. Mild bibasilar atelectasis noted. There is no evidence of focal airspace disease, pulmonary edema, suspicious pulmonary nodule/mass, pleural effusion, or pneumothorax. No acute bony abnormalities are identified. IMPRESSION: Mild basilar atelectasis. Electronically Signed   By: Margarette Canada M.D.   On: 05/31/2017 18:54   Ct Head Wo Contrast  Result Date: 06/01/2017 CLINICAL DATA:  Increased sleeping EXAM: CT HEAD WITHOUT CONTRAST TECHNIQUE: Contiguous axial images were obtained from the base of the skull through the vertex without intravenous contrast. COMPARISON:  None. FINDINGS: Brain: There is severe global atrophy. Minimal chronic ischemic changes in the periventricular white matter are noted. No mass effect, midline shift, or acute hemorrhage. Vascular: No hyperdense vessel or unexpected calcification. Skull: Cranium is intact. Sinuses/Orbits: Mastoid air cells are clear. Visualized paranasal sinuses are clear. Other: Noncontributory IMPRESSION: No acute intracranial pathology. Electronically Signed   By: Marybelle Killings M.D.   On: 06/01/2017 17:00   Ct Abdomen Pelvis W Contrast  Result Date: 06/01/2017 CLINICAL DATA:  66 year old male with right abdominal and pelvic pain for several days. EXAM: CT ABDOMEN AND PELVIS WITH CONTRAST TECHNIQUE: Multidetector CT imaging of the abdomen and pelvis was performed using the standard protocol following bolus administration of intravenous contrast. CONTRAST:  165mL ISOVUE-300  IOPAMIDOL (ISOVUE-300) INJECTION 61% COMPARISON:  None. FINDINGS: Lower chest: Mild bibasilar atelectasis/ scarring noted. Coronary artery calcifications are present. Hepatobiliary: The liver and gallbladder are unremarkable. There is no evidence of biliary dilatation. Pancreas: Unremarkable  Spleen: Unremarkable Adrenals/Urinary Tract: The kidneys, adrenal glands and bladder are unremarkable except for bilateral renal parapelvic cysts. Stomach/Bowel: A thickened inflamed appendix is noted with adjacent 5.2 x 6 x 7 cm complex fluid and inflammation in the lateral right lower abdomen abutting the abdominal musculature and compatible with ruptured appendicitis and ill-defined/developing abscess. There is no evidence of bowel obstruction or pneumoperitoneum. Colonic diverticulosis noted without evidence of diverticulitis. Vascular/Lymphatic: Aortic atherosclerosis. No enlarged abdominal or pelvic lymph nodes. Reproductive: Prostate unremarkable Other: No ascites. A moderate right inguinal hernia containing fat is noted. Musculoskeletal: No acute or suspicious abnormality. IMPRESSION: Ruptured appendicitis with adjacent 5.2 x 6 x 7 cm ill-defined/developing abscess. No evidence of pneumoperitoneum or bowel obstruction. Coronary artery disease. Aortic Atherosclerosis (ICD10-I70.0). Moderate right inguinal hernia containing fat. Results were called by telephone at the time of interpretation on 06/01/2017 at 5:07 pm to [facility] , who verbally acknowledged these results. Electronically Signed   By: Margarette Canada M.D.   On: 06/01/2017 17:10   Dg Abd Portable 1v  Result Date: 06/03/2017 CLINICAL DATA:  Abdominal pain . EXAM: PORTABLE ABDOMEN - 1 VIEW COMPARISON:  CT 06/02/2017. FINDINGS: Catheter noted over the right abdomen in stable position. No bowel distention. Prior hernia repair. Degenerative changes lumbar spine with scoliosis concave left . IMPRESSION: 1. Right abdominal drainage catheter in stable position. 2. No acute abnormality.  No bowel distention. Electronically Signed   By: Marcello Moores  Register   On: 06/03/2017 10:28   Ct Image Guided Drainage By Percutaneous Catheter  Result Date: 06/03/2017 CLINICAL DATA:  Appendiceal abscess EXAM: CT GUIDED DRAINAGE OF RETROPERITONEAL ABSCESS  ANESTHESIA/SEDATION: Intravenous Fentanyl and Versed were administered as conscious sedation during continuous monitoring of the patient's level of consciousness and physiological / cardiorespiratory status by the radiology RN, with a total moderate sedation time of 21 minutes. PROCEDURE: The procedure, risks, benefits, and alternatives were explained to the patient. Questions regarding the procedure were encouraged and answered. The patient understands and consents to the procedure. Select axial scans were obtained through the lower abdomen. The collection was localized and an appropriate skin entry site was determined and marked. The operative field was prepped with chlorhexidinein a sterile fashion, and a sterile drape was applied covering the operative field. A sterile gown and sterile gloves were used for the procedure. Local anesthesia was provided with 1% Lidocaine. Under CT fluoroscopic guidance, an 18 gauge trocar needle was advanced into the collection. Purulent material could be aspirated. An Amplatz wire advanced easily within the collection, its position confirmed on CT. Tract dilated to facilitate placement of a 10 French pigtail drain catheter. Position confirmed on CT. 30 mL of purulent aspirate were removed, sent for Gram stain and culture. Catheter secured externally with 0 Prolene suture and StatLock and placed to gravity drain bag. The patient tolerated the procedure well. COMPLICATIONS: None immediate FINDINGS: The complex right lower quadrant retroperitoneal collection was again localized. Ten French pigtail drain catheter placed without complication as above. IMPRESSION: 1. Technically successful CT-guided retroperitoneal abscess drain catheter placement Electronically Signed   By: Lucrezia Europe M.D.   On: 06/03/2017 09:30    Labs:  CBC:  Recent Labs  05/31/17 1813 06/01/17 1219 06/02/17 0723 06/03/17 0942  WBC 17.0* 18.2* 15.9* 19.8*  HGB 15.9 16.2  14.1 14.4  HCT 48.2 47.0 41.0  41.2  PLT 320 276 251 252    COAGS:  Recent Labs  06/01/17 2147  INR 1.40    BMP:  Recent Labs  05/31/17 1813 06/01/17 1219 06/02/17 0723 06/03/17 0942  NA 135 138 137 137  K 3.9 4.0 3.5 3.6  CL 100* 100* 105 104  CO2 24 27 21* 25  GLUCOSE 160* 124* 107* 106*  BUN 11 16 13 13   CALCIUM 9.4 9.3 8.3* 8.4*  CREATININE 0.99 0.96 0.89 0.97  GFRNONAA >60 >60 >60 >60  GFRAA >60 >60 >60 >60    LIVER FUNCTION TESTS:  Recent Labs  07/24/16 1525 06/01/17 1219  BILITOT 0.49 0.9  AST 20 38  ALT 17 47  ALKPHOS 61 98  PROT 7.4 7.6  ALBUMIN 3.9 3.4*    Assessment and Plan:  Appendiceal abscess, status post drainage on 06/02/17; recent temperature spike, currently 99.3; WBC 19.8(15.9), hemoglobin stable, creatinine normal, drain fluid cultures pending; blood cultures negative to date; plain abdominal film today with no acute findings ;continue with IV hydration, antibiotics; check final fluid cultures and sensitivities; monitor labs continue drain irrigation and output monitoring; if WBC continues to increase and fever persists recheck CT, otherwise obtain follow-up CT within 1 week of drain placement. Patient will also need drain injection prior to consideration of removal.   Electronically Signed: D. Rowe Robert, PA-C 06/03/2017, 11:34 AM   I spent a total of 15 minutes at the the patient's bedside AND on the patient's hospital floor or unit, greater than 50% of which was counseling/coordinating care for appendiceal abscess drain    Patient ID: John [name], male   DOB: 04-03-1951, 66 y.o.   MRN: [id number]

## 2017-06-03 NOTE — Progress Notes (Signed)
Initial Nutrition Assessment  INTERVENTION:   Provide Boost Breeze po TID, each supplement provides 250 kcal and 9 grams of protein RD to continue to monitor  NUTRITION DIAGNOSIS:   Inadequate oral intake related to lethargy/confusion as evidenced by meal completion < 25%.  GOAL:   Patient will meet greater than or equal to 90% of their needs  MONITOR:   PO intake, Supplement acceptance, Labs, Weight trends, Diet advancement, I & O's  REASON FOR ASSESSMENT:   Malnutrition Screening Tool   ASSESSMENT:   66 y.o. 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 Patient presented with abdominal pain, decrease oral intake, progressive weakness. He is found to have appendicitis with perforation and abscess. General surgery consulted for evaluation. Recommending IR percutaneous drain.  Patient in room with wife and son at bedside. Pt unable to provide history given dementia. Pt's wife states the pt has not eaten anything since Sunday 6/24. She gave him a bite of pizza and the pt spit it back up. Pt with clear liquid tray at bedside and ate very little of this. Pt's family is open to pt receiving Boost Breeze to try while on clear liquids. Pt has not had a BM x 12 days which may be impacting pt's appetite. Pt's wife has not noticed the patient having any difficulty chewing.  Per chart, pt has lost 8 lb since 2/16 (5% wt loss x 4.5 months, insignificant for time frame). Nutrition-Focused physical exam completed. Findings are no fat depletion, mild muscle depletion, and no edema.   Labs reviewed. Medications: IV folic acid daily, IV Thiamine daily  Diet Order:  Diet clear liquid Room service appropriate? Yes; Fluid consistency: Thin  Skin:  Reviewed, no issues  Last BM:  6/23  Height:   Ht Readings from Last 1 Encounters:  06/02/17 5\' 10"  (1.778 m)    Weight:   Wt Readings from Last 1 Encounters:  06/02/17 158  lb 8.2 oz (71.9 kg)    Ideal Body Weight:  75.5 kg  BMI:  Body mass index is 22.74 kg/m.  Estimated Nutritional Needs:   Kcal:  1800-2000  Protein:  80-90g  Fluid:  2L/day  EDUCATION NEEDS:   No education needs identified at this time  Clayton Bibles, MS, RD, LDN Pager: [phone] After Hours Pager: [phone]

## 2017-06-03 NOTE — Progress Notes (Signed)
[facility] Progress Note     Subjective: CC: abdominal abscess Patient resting comfortably, no family present. Oriented to self only. Per RN no vomiting overnight, no BM. Tmax 102 UOP good  Objective: Vital signs in last 24 hours: Temp:  [98.1 F (36.7 C)-102 F (38.9 C)] 99.3 F (37.4 C) (06/28 0557) Pulse Rate:  [62-89] 89 (06/28 0424) Resp:  [13-70] 16 (06/28 0424) BP: (88-147)/(57-91) 94/60 (06/28 0424) SpO2:  [93 %-100 %] 93 % (06/28 0424) Weight:  [71.9 kg (158 lb 8.2 oz)] 71.9 kg (158 lb 8.2 oz) (06/27 0948) Last BM Date: 05/29/17  Intake/Output from previous day: 06/27 0701 - 06/28 0700 In: 4390 [P.O.:220; I.V.:3870; IV Piggyback:300] Out: 1135 [Urine:1100] Intake/Output this shift: Total I/O In: 50 [IV Piggyback:50] Out: -   PE: Gen:  Alert, NAD, pleasant Card:  Regular rate and rhythm, pedal pulses 2+ BL Pulm:  Normal effort, clear to auscultation bilaterally Abd: Soft, TTP of RLQ, non-distended, bowel sounds present in all 4 quadrants, drain in RLQ with bloody drainage present. Skin: warm and dry, no rashes  Psych: A&Ox3   Lab Results:   Recent Labs  06/01/17 1219 06/02/17 0723  WBC 18.2* 15.9*  HGB 16.2 14.1  HCT 47.0 41.0  PLT 276 251   BMET  Recent Labs  06/01/17 1219 06/02/17 0723  NA 138 137  K 4.0 3.5  CL 100* 105  CO2 27 21*  GLUCOSE 124* 107*  BUN 16 13  CREATININE 0.96 0.89  CALCIUM 9.3 8.3*   PT/INR  Recent Labs  06/01/17 2147  LABPROT 17.3*  INR 1.40   CMP     Component Value Date/Time   NA 137 06/02/2017 0723   NA 141 07/24/2016 1525   K 3.5 06/02/2017 0723   K 4.4 07/24/2016 1525   CL 105 06/02/2017 0723   CO2 21 (L) 06/02/2017 0723   CO2 27 07/24/2016 1525   GLUCOSE 107 (H) 06/02/2017 0723   GLUCOSE 95 07/24/2016 1525   BUN 13 06/02/2017 0723   BUN 14.8 07/24/2016 1525   CREATININE 0.89 06/02/2017 0723   CREATININE 0.9 07/24/2016 1525   CALCIUM 8.3 (L) 06/02/2017 0723   CALCIUM 10.0  07/24/2016 1525   PROT 7.6 06/01/2017 1219   PROT 7.4 07/24/2016 1525   ALBUMIN 3.4 (L) 06/01/2017 1219   ALBUMIN 3.9 07/24/2016 1525   AST 38 06/01/2017 1219   AST 20 07/24/2016 1525   ALT 47 06/01/2017 1219   ALT 17 07/24/2016 1525   ALKPHOS 98 06/01/2017 1219   ALKPHOS 61 07/24/2016 1525   BILITOT 0.9 06/01/2017 1219   BILITOT 0.49 07/24/2016 1525   GFRNONAA >60 06/02/2017 0723   GFRAA >60 06/02/2017 0723     Studies/Results: Dg Chest 2 View  Result Date: 06/01/2017 CLINICAL DATA:  Altered mental status, dementia, slight drooping on the right EXAM: CHEST  2 VIEW COMPARISON:  Chest x-ray of 05/31/2017 FINDINGS: No active infiltrate or effusion is seen. Mediastinal and hilar contours are unremarkable. The heart is within normal limits in size. No acute bony abnormality is seen. IMPRESSION: No active cardiopulmonary disease. Electronically Signed   By: Ivar Drape M.D.   On: 06/01/2017 14:42   Ct Head Wo Contrast  Result Date: 06/01/2017 CLINICAL DATA:  Increased sleeping EXAM: CT HEAD WITHOUT CONTRAST TECHNIQUE: Contiguous axial images were obtained from the base of the skull through the vertex without intravenous contrast. COMPARISON:  None. FINDINGS: Brain: There is severe global atrophy. Minimal chronic ischemic changes  in the periventricular white matter are noted. No mass effect, midline shift, or acute hemorrhage. Vascular: No hyperdense vessel or unexpected calcification. Skull: Cranium is intact. Sinuses/Orbits: Mastoid air cells are clear. Visualized paranasal sinuses are clear. Other: Noncontributory IMPRESSION: No acute intracranial pathology. Electronically Signed   By: Marybelle Killings M.D.   On: 06/01/2017 17:00   Ct Abdomen Pelvis W Contrast  Result Date: 06/01/2017 CLINICAL DATA:  66 year old male with right abdominal and pelvic pain for several days. EXAM: CT ABDOMEN AND PELVIS WITH CONTRAST TECHNIQUE: Multidetector CT imaging of the abdomen and pelvis was performed using  the standard protocol following bolus administration of intravenous contrast. CONTRAST:  178mL ISOVUE-300 IOPAMIDOL (ISOVUE-300) INJECTION 61% COMPARISON:  None. FINDINGS: Lower chest: Mild bibasilar atelectasis/ scarring noted. Coronary artery calcifications are present. Hepatobiliary: The liver and gallbladder are unremarkable. There is no evidence of biliary dilatation. Pancreas: Unremarkable Spleen: Unremarkable Adrenals/Urinary Tract: The kidneys, adrenal glands and bladder are unremarkable except for bilateral renal parapelvic cysts. Stomach/Bowel: A thickened inflamed appendix is noted with adjacent 5.2 x 6 x 7 cm complex fluid and inflammation in the lateral right lower abdomen abutting the abdominal musculature and compatible with ruptured appendicitis and ill-defined/developing abscess. There is no evidence of bowel obstruction or pneumoperitoneum. Colonic diverticulosis noted without evidence of diverticulitis. Vascular/Lymphatic: Aortic atherosclerosis. No enlarged abdominal or pelvic lymph nodes. Reproductive: Prostate unremarkable Other: No ascites. A moderate right inguinal hernia containing fat is noted. Musculoskeletal: No acute or suspicious abnormality. IMPRESSION: Ruptured appendicitis with adjacent 5.2 x 6 x 7 cm ill-defined/developing abscess. No evidence of pneumoperitoneum or bowel obstruction. Coronary artery disease. Aortic Atherosclerosis (ICD10-I70.0). Moderate right inguinal hernia containing fat. Results were called by telephone at the time of interpretation on 06/01/2017 at 5:07 pm to [facility] , who verbally acknowledged these results. Electronically Signed   By: Margarette Canada M.D.   On: 06/01/2017 17:10    Anti-infectives: Anti-infectives    Start     Dose/Rate Route Frequency Ordered Stop   06/02/17 0000  piperacillin-tazobactam (ZOSYN) IVPB 3.375 g     3.375 g 12.5 mL/hr over 240 Minutes Intravenous Every 8 hours 06/01/17 2103     06/01/17 1745  piperacillin-tazobactam  (ZOSYN) IVPB 3.375 g     3.375 g 100 mL/hr over 30 Minutes Intravenous  Once 06/01/17 1730 06/01/17 1839   06/01/17 1730  piperacillin-tazobactam (ZOSYN) IVPB 4.5 g  Status:  Discontinued     4.5 g 200 mL/hr over 30 Minutes Intravenous  Once 06/01/17 1728 06/01/17 1730       Assessment/Plan Probable ruptured appendicitis w/ abscess - Continue IV abx, IVF, clear liquids - IR perc drain 6/27 - 30 ml purulent material aspirated, cx pending. G+ cocci and G- coccobacilli on GS. 35 cc output total.  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 Alvy Bimler  FEN - clears VTE - SCDs, lovenox ID - IV Zosyn (6/26>>)  Plan: Continue drain. Continue IV abx. Labs pending.   LOS: 2 days    Brigid Re , [facility] Surgery 06/03/2017, 8:16 AM Pager: [phone] Consults: [phone] Mon-Fri 7:00 am-4:30 pm Sat-Sun 7:00 am-11:30 am

## 2017-06-03 NOTE — Progress Notes (Signed)
PROGRESS NOTE    John [name]  PPJ:[id number] DOB: [birth year] DOA: 06/01/2017 PCP: Cari Caraway, MD   Brief Narrative: John [name] is a 66 y.o. 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 Patient presented with abdominal pain, decrease oral intake, progressive weakness. He is found to have appendicitis with perforation and abscess. General surgery consulted for evaluation. Recommending IR percutaneous drain.   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 Fever overnight. S/p percutaneous drain on 06/02/17. White count worsening. Abdominal x-ray unremarkable for acute pathology. -General surgery recommendations -IR recommendations -Continue Zosyn -Continue IV fluids -BMP/CBC in AM  Appendicitis w/ rupture/abscess Abdominal pain Worsening pain with associated worsening leukocytosis and mental status depression (in setting of dilaudid) -continue drain -CT abdomen/pelvis  Somnolence Likely secondary to dilaudid received overnight. -watch mental status  Dementia Patient is currently not agitated. -Continue Ativan when necessary  History of PE Patient on Xarelto as an outpatient. This was held on admission. Started on Lovenox VTE prophylaxis dose on admission. S/p perc drain. -holding full anticoagulation   DVT prophylaxis: Lovenox Code Status: Full code Family Communication: Wife, at bedside Disposition Plan: Discharge pending management of appendicitis   Consultants:   General surgery  Interventional radiology  Procedures:   None  Antimicrobials:  Zosyn   Subjective: Abdominal pain. More  somnolent.  Objective: Vitals:   06/02/17 1705 06/02/17 2121 06/03/17 0424 06/03/17 0557  BP: 100/60 (!) 147/91 94/60   Pulse: 81 82 89   Resp: 15 16 16    Temp:  98.1 F (36.7 C) (!) 102 F (38.9 C) 99.3 F (37.4 C)  TempSrc:  Oral Axillary Oral  SpO2: 97% 97% 93%   Weight:      Height:        Intake/Output Summary (Last 24 hours) at 06/03/17 0853 Last data filed at 06/03/17 0810  Gross per 24 hour  Intake             4440 ml  Output             1135 ml  Net             3305 ml   Filed Weights   06/01/17 2048 06/02/17 0948  Weight: 75.3 kg (166 lb) 71.9 kg (158 lb 8.2 oz)    Examination:  General exam: Appears calm and comfortable most of the time with intermittent wincing Respiratory system: Clear to auscultation. Respiratory effort normal. Cardiovascular system: Normal S1/S2. Regular rhythm, normal rate. No murmurs. Gastrointestinal system: Abdomen is nondistended, soft and generally tender especially. Normal bowel sounds heard. Central nervous system: Somnolent and not oriented. No focal neurological deficits. Follows some commands Extremities: No edema. No calf tenderness Skin: No cyanosis. No rashes Psychiatry: Judgement and insight impaired. Flat affect.     Data Reviewed: I have personally reviewed following labs and imaging studies  CBC:  Recent Labs Lab 05/31/17 1813 06/01/17 1219 06/02/17 0723  WBC 17.0* 18.2* 15.9*  NEUTROABS  --  13.9*  --   HGB 15.9 16.2 14.1  HCT 48.2 47.0 41.0  MCV 98.4 95.9 94.0  PLT 320 276 580   Basic Metabolic Panel:  Recent Labs Lab 05/31/17 1813 06/01/17 1219 06/02/17 0723  NA 135 138 137  K 3.9 4.0 3.5  CL 100* 100* 105  CO2 24 27 21*  GLUCOSE 160* 124* 107*  BUN 11 16 13   CREATININE 0.99 0.96 0.89  CALCIUM 9.4 9.3 8.3*   GFR: Estimated Creatinine Clearance: 83 mL/min (by C-G formula based on SCr of 0.89 mg/dL). Liver Function Tests:  Recent Labs Lab 06/01/17 1219  AST 38  ALT 47  ALKPHOS 98   BILITOT 0.9  PROT 7.6  ALBUMIN 3.4*   No results for input(s): LIPASE, AMYLASE in the last 168 hours. No results for input(s): AMMONIA in the last 168 hours. Coagulation Profile:  Recent Labs Lab 06/01/17 2147  INR 1.40   Cardiac Enzymes:  Recent Labs Lab 06/01/17 1354  TROPONINI <0.03   BNP (last 3 results) No results for input(s): PROBNP in the last 8760 hours. HbA1C: No results for input(s): HGBA1C in the last 72 hours. CBG:  Recent Labs Lab 06/01/17 1221  GLUCAP 113*   Lipid Profile: No results for input(s): CHOL, HDL, LDLCALC, TRIG, CHOLHDL, LDLDIRECT in the last 72 hours. Thyroid Function Tests: No results for input(s): TSH, T4TOTAL, FREET4, T3FREE, THYROIDAB in the last 72 hours. Anemia Panel: No results for input(s): VITAMINB12, FOLATE, FERRITIN, TIBC, IRON, RETICCTPCT in the last 72 hours. Sepsis Labs:  Recent Labs Lab 06/01/17 1358 06/01/17 1658 06/01/17 2147  PROCALCITON  --   --  0.42  LATICACIDVEN 1.4 0.9  --     Recent Results (from the past 240 hour(s))  Culture, blood (routine x 2)     Status: None (Preliminary result)   Collection Time: 06/01/17  3:19 PM  Result Value Ref Range Status   Specimen Description BLOOD RIGHT ANTECUBITAL  Final   Special Requests   Final    BOTTLES DRAWN AEROBIC AND ANAEROBIC Blood Culture adequate volume   Culture   Final    NO GROWTH < 24 HOURS Performed at [facility], [place] [address], [place], [place] [address]    Report Status PENDING  Incomplete  Culture, blood (routine x 2)     Status: None (Preliminary result)   Collection Time: 06/01/17  4:04 PM  Result Value Ref Range Status   Specimen Description BLOOD LEFT FOREARM  Final   Special Requests   Final    BOTTLES DRAWN AEROBIC AND ANAEROBIC Blood Culture adequate volume   Culture   Final    NO GROWTH < 24 HOURS Performed at [facility], [place] [address], [place], [place] [address]    Report Status PENDING  Incomplete  Surgical PCR  screen     Status: None   Collection Time: 06/02/17  4:04 PM  Result Value Ref Range Status   MRSA, PCR NEGATIVE NEGATIVE Final   Staphylococcus aureus NEGATIVE NEGATIVE Final    Comment:        The Xpert SA Assay (FDA approved for NASAL specimens in patients over 1 years of age), is one component of a comprehensive surveillance program.  Test performance has been validated by [facility] for patients greater than or equal to 25 year old. It is not intended to diagnose infection nor to guide or monitor treatment.   Aerobic/Anaerobic Culture (surgical/deep wound)     Status: None (Preliminary result)   Collection Time: 06/02/17  5:10 PM  Result Value Ref Range Status   Specimen Description ABSCESS RLQ CT DRAIN  Final   Special Requests NONE  Final   Gram Stain   Final    MODERATE WBC PRESENT, PREDOMINANTLY PMN ABUNDANT GRAM POSITIVE COCCI IN PAIRS ABUNDANT GRAM NEGATIVE COCCOBACILLI FEW GRAM POSITIVE RODS Performed at [facility], [place] [address] [place], [place] [address]    Culture PENDING  Incomplete   Report Status PENDING  Incomplete         Radiology Studies: Dg Chest 2 View  Result Date: 06/01/2017 CLINICAL DATA:  Altered mental status, dementia, slight drooping on the right EXAM: CHEST  2 VIEW COMPARISON:  Chest x-ray of 05/31/2017 FINDINGS: No active infiltrate or effusion is seen. Mediastinal and hilar contours are unremarkable. The heart is within normal limits in size. No acute bony abnormality is seen. IMPRESSION: No active cardiopulmonary disease. Electronically Signed   By: Ivar Drape M.D.   On: 06/01/2017 14:42   Ct Head Wo Contrast  Result Date: 06/01/2017 CLINICAL DATA:  Increased sleeping EXAM: CT HEAD WITHOUT CONTRAST TECHNIQUE: Contiguous axial images were obtained from the base of the skull through the vertex without intravenous contrast. COMPARISON:  None. FINDINGS: Brain: There is severe global atrophy. Minimal chronic ischemic changes in  the periventricular white matter are noted. No mass effect, midline shift, or acute hemorrhage. Vascular: No hyperdense vessel or unexpected calcification. Skull: Cranium is intact. Sinuses/Orbits: Mastoid air cells are clear. Visualized paranasal sinuses are clear. Other: Noncontributory IMPRESSION: No acute intracranial pathology. Electronically Signed   By: Marybelle Killings M.D.   On: 06/01/2017 17:00   Ct Abdomen Pelvis W Contrast  Result Date: 06/01/2017 CLINICAL DATA:  66 year old male with right abdominal and pelvic pain for several days. EXAM: CT ABDOMEN AND PELVIS WITH CONTRAST TECHNIQUE: Multidetector CT imaging of the abdomen and pelvis was performed using the standard protocol following bolus administration of intravenous contrast. CONTRAST:  164mL ISOVUE-300 IOPAMIDOL (ISOVUE-300) INJECTION 61% COMPARISON:  None. FINDINGS: Lower chest: Mild bibasilar atelectasis/ scarring noted. Coronary artery calcifications are present. Hepatobiliary: The liver and gallbladder are unremarkable. There is no evidence of biliary dilatation. Pancreas: Unremarkable Spleen: Unremarkable Adrenals/Urinary Tract: The kidneys, adrenal glands and bladder are unremarkable except for bilateral renal parapelvic cysts. Stomach/Bowel: A thickened inflamed appendix is noted with adjacent 5.2 x 6 x 7 cm complex fluid and inflammation in the lateral right lower abdomen abutting the abdominal musculature and compatible with ruptured appendicitis and ill-defined/developing abscess. There is no evidence of bowel obstruction or pneumoperitoneum. Colonic diverticulosis noted without evidence of diverticulitis. Vascular/Lymphatic: Aortic atherosclerosis. No enlarged abdominal or pelvic lymph nodes. Reproductive: Prostate unremarkable Other: No ascites. A moderate right inguinal hernia containing fat is noted. Musculoskeletal: No acute or suspicious abnormality. IMPRESSION: Ruptured appendicitis with adjacent 5.2 x 6 x 7 cm  ill-defined/developing abscess. No evidence of pneumoperitoneum or bowel obstruction. Coronary artery disease. Aortic Atherosclerosis (ICD10-I70.0). Moderate right inguinal hernia containing fat. Results were called by telephone at the time of interpretation on 06/01/2017 at 5:07 pm to [facility] , who verbally acknowledged these results. Electronically Signed   By: Margarette Canada M.D.   On: 06/01/2017 17:10        Scheduled Meds: . enoxaparin (LOVENOX) injection  40 mg Subcutaneous Q24H  . folic acid  1 mg Intravenous Daily  . thiamine  100 mg Intravenous Daily   Continuous Infusions: . sodium chloride 100 mL/hr at 06/01/17 1518  . famotidine (PEPCID) IV Stopped (06/02/17 2124)  . piperacillin-tazobactam (ZOSYN)  IV 3.375 g (06/03/17 0810)     LOS: 2 days     Cordelia Poche,  MD [facility] 06/03/2017, 8:53 AM Pager: [phone]  If 7PM-7AM, please contact night-coverage [email or web address] Password [facility] 06/03/2017, 8:53 AM

## 2017-06-04 DIAGNOSIS — D72823 Leukemoid reaction: Secondary | ICD-10-CM

## 2017-06-04 LAB — CBC
HCT: 40.1 % (ref 39.0–52.0)
Hemoglobin: 13.9 g/dL (ref 13.0–17.0)
MCH: 32 pg (ref 26.0–34.0)
MCHC: 34.7 g/dL (ref 30.0–36.0)
MCV: 92.4 fL (ref 78.0–100.0)
PLATELETS: 252 10*3/uL (ref 150–400)
RBC: 4.34 MIL/uL (ref 4.22–5.81)
RDW: 13.5 % (ref 11.5–15.5)
WBC: 15.3 10*3/uL — AB (ref 4.0–10.5)

## 2017-06-04 LAB — BASIC METABOLIC PANEL
Anion gap: 10 (ref 5–15)
BUN: 13 mg/dL (ref 6–20)
CALCIUM: 8.3 mg/dL — AB (ref 8.9–10.3)
CHLORIDE: 104 mmol/L (ref 101–111)
CO2: 24 mmol/L (ref 22–32)
CREATININE: 0.85 mg/dL (ref 0.61–1.24)
GFR calc Af Amer: >60 mL/min (ref >60)
GFR calc non Af Amer: >60 mL/min (ref >60)
Glucose, Bld: 111 mg/dL — ABNORMAL HIGH (ref 65–99)
Potassium: 3.5 mmol/L (ref 3.5–5.1)
SODIUM: 138 mmol/L (ref 135–145)

## 2017-06-04 MED ORDER — VANCOMYCIN HCL IN DEXTROSE 1-5 GM/200ML-% IV SOLN
1000.0000 mg | Freq: Two times a day (BID) | INTRAVENOUS | Status: DC
  Administered 2017-06-04 – 2017-06-08 (×8): 1000 mg via INTRAVENOUS
  Filled 2017-06-04 (×9): qty 200

## 2017-06-04 MED ORDER — RIVAROXABAN 20 MG PO TABS
20.0000 mg | ORAL_TABLET | Freq: Every day | ORAL | Status: DC
  Administered 2017-06-04 – 2017-06-08 (×6): 20 mg via ORAL
  Filled 2017-06-04 (×5): qty 1

## 2017-06-04 NOTE — Progress Notes (Signed)
Referring Physician(s): Rosenbower,T  Supervising Physician: Corrie Mckusick  Patient Status:  [name] - In-pt  Chief Complaint:  Appendiceal abscess  Subjective: Pt currently calm, pleasantly confused; wife in room; no BM yet   Allergies: Patient has no known allergies.  Medications: Prior to Admission medications   Medication Sig Start Date End Date Taking? Authorizing Provider  B COMPLEX VITAMINS PO Take by mouth 3 (three) times daily.   Yes [provider]  folic acid (FOLVITE) 1 MG tablet Take 1 tablet (1 mg total) by mouth daily. 06/25/15  Yes Thurnell Lose, MD  loratadine (CLARITIN) 10 MG tablet Take 10 mg by mouth daily.   Yes [provider]  rivaroxaban (XARELTO) 20 MG TABS tablet Take 1 tablet (20 mg total) by mouth daily with supper. 09/18/16  Yes Gorsuch, Ni, MD  thiamine 100 MG tablet Take 1 tablet (100 mg total) by mouth daily. 06/25/15  Yes Thurnell Lose, MD  vitamin B-12 (CYANOCOBALAMIN) 1000 MCG tablet Take 1,000 mcg by mouth daily.   Yes [provider]     Vital Signs: BP 127/62 (BP Location: Left Arm)   Pulse 91   Temp 98.6 F (37 C) (Axillary)   Resp 18   Ht 5\' 10"  (1.778 m)   Wt 158 lb 8.2 oz (71.9 kg)   SpO2 93%   BMI 22.74 kg/m   Physical Exam RLQ drain intact, surrounding skin erythema noted/prob cellulitis; output 10-15 cc reddish brown feculent fluid; drain flushed without difficulty  Imaging: Dg Chest 2 View  Result Date: 06/01/2017 CLINICAL DATA:  Altered mental status, dementia, slight drooping on the right EXAM: CHEST  2 VIEW COMPARISON:  Chest x-ray of 05/31/2017 FINDINGS: No active infiltrate or effusion is seen. Mediastinal and hilar contours are unremarkable. The heart is within normal limits in size. No acute bony abnormality is seen. IMPRESSION: No active cardiopulmonary disease. Electronically Signed   By: Ivar Drape M.D.   On: 06/01/2017 14:42   Dg Chest 2 View  Result Date:  05/31/2017 CLINICAL DATA:  Fever and weakness. EXAM: CHEST  2 VIEW COMPARISON:  06/24/2015 chest radiograph FINDINGS: The cardiomediastinal silhouette is unremarkable. Mild bibasilar atelectasis noted. There is no evidence of focal airspace disease, pulmonary edema, suspicious pulmonary nodule/mass, pleural effusion, or pneumothorax. No acute bony abnormalities are identified. IMPRESSION: Mild basilar atelectasis. Electronically Signed   By: Margarette Canada M.D.   On: 05/31/2017 18:54   Ct Head Wo Contrast  Result Date: 06/01/2017 CLINICAL DATA:  Increased sleeping EXAM: CT HEAD WITHOUT CONTRAST TECHNIQUE: Contiguous axial images were obtained from the base of the skull through the vertex without intravenous contrast. COMPARISON:  None. FINDINGS: Brain: There is severe global atrophy. Minimal chronic ischemic changes in the periventricular white matter are noted. No mass effect, midline shift, or acute hemorrhage. Vascular: No hyperdense vessel or unexpected calcification. Skull: Cranium is intact. Sinuses/Orbits: Mastoid air cells are clear. Visualized paranasal sinuses are clear. Other: Noncontributory IMPRESSION: No acute intracranial pathology. Electronically Signed   By: Marybelle Killings M.D.   On: 06/01/2017 17:00   Ct Abdomen Pelvis W Contrast  Result Date: 06/03/2017 CLINICAL DATA:  Drain placed yesterday EXAM: CT ABDOMEN AND PELVIS WITH CONTRAST TECHNIQUE: Multidetector CT imaging of the abdomen and pelvis was performed using the standard protocol following bolus administration of intravenous contrast. CONTRAST:  141mL ISOVUE-300 IOPAMIDOL (ISOVUE-300) INJECTION 61% COMPARISON:  06/01/2017 FINDINGS: Lower chest: Dependent atelectasis in the lower lobes. Heart is borderline in size. Scattered coronary  artery calcifications. Hepatobiliary: No focal hepatic abnormality. Gallbladder unremarkable. Pancreas: No focal abnormality or ductal dilatation. Spleen: No focal abnormality.  Normal size. Adrenals/Urinary  Tract: Bilateral renal parapelvic cysts. No hydronephrosis. Adrenal glands and urinary bladder unremarkable. Stomach/Bowel: Changes of ruptured appendicitis again noted. There is sigmoid diverticulosis. No active diverticulitis. Stomach and small bowel decompressed. Vascular/Lymphatic: No evidence of aneurysm or adenopathy. Reproductive: Mild prostate prominence. Other: Right lower quadrant fluid collection has decreased with right lower quadrant drain in place. Fluid collection measures approximately 3.6 x 2.9 cm compared to 6.1 x 5.2 cm previously. No free fluid or free air. Bilateral inguinal hernias containing fat. Musculoskeletal: No acute bony abnormality. IMPRESSION: Decreasing size of the right lower quadrant retroperitoneal abscess post drain placement. Continued inflamed adjacent appendix compatible with ruptured appendicitis. Sigmoid diverticulosis. Dependent bibasilar atelectasis. Electronically Signed   By: Rolm Baptise M.D.   On: 06/03/2017 15:37   Ct Abdomen Pelvis W Contrast  Result Date: 06/01/2017 CLINICAL DATA:  66 year old male with right abdominal and pelvic pain for several days. EXAM: CT ABDOMEN AND PELVIS WITH CONTRAST TECHNIQUE: Multidetector CT imaging of the abdomen and pelvis was performed using the standard protocol following bolus administration of intravenous contrast. CONTRAST:  136mL ISOVUE-300 IOPAMIDOL (ISOVUE-300) INJECTION 61% COMPARISON:  None. FINDINGS: Lower chest: Mild bibasilar atelectasis/ scarring noted. Coronary artery calcifications are present. Hepatobiliary: The liver and gallbladder are unremarkable. There is no evidence of biliary dilatation. Pancreas: Unremarkable Spleen: Unremarkable Adrenals/Urinary Tract: The kidneys, adrenal glands and bladder are unremarkable except for bilateral renal parapelvic cysts. Stomach/Bowel: A thickened inflamed appendix is noted with adjacent 5.2 x 6 x 7 cm complex fluid and inflammation in the lateral right lower abdomen  abutting the abdominal musculature and compatible with ruptured appendicitis and ill-defined/developing abscess. There is no evidence of bowel obstruction or pneumoperitoneum. Colonic diverticulosis noted without evidence of diverticulitis. Vascular/Lymphatic: Aortic atherosclerosis. No enlarged abdominal or pelvic lymph nodes. Reproductive: Prostate unremarkable Other: No ascites. A moderate right inguinal hernia containing fat is noted. Musculoskeletal: No acute or suspicious abnormality. IMPRESSION: Ruptured appendicitis with adjacent 5.2 x 6 x 7 cm ill-defined/developing abscess. No evidence of pneumoperitoneum or bowel obstruction. Coronary artery disease. Aortic Atherosclerosis (ICD10-I70.0). Moderate right inguinal hernia containing fat. Results were called by telephone at the time of interpretation on 06/01/2017 at 5:07 pm to [facility] , who verbally acknowledged these results. Electronically Signed   By: Margarette Canada M.D.   On: 06/01/2017 17:10   Dg Abd Portable 1v  Result Date: 06/03/2017 CLINICAL DATA:  Abdominal pain . EXAM: PORTABLE ABDOMEN - 1 VIEW COMPARISON:  CT 06/02/2017. FINDINGS: Catheter noted over the right abdomen in stable position. No bowel distention. Prior hernia repair. Degenerative changes lumbar spine with scoliosis concave left . IMPRESSION: 1. Right abdominal drainage catheter in stable position. 2. No acute abnormality.  No bowel distention. Electronically Signed   By: Marcello Moores  Register   On: 06/03/2017 10:28   Ct Image Guided Drainage By Percutaneous Catheter  Result Date: 06/03/2017 CLINICAL DATA:  Appendiceal abscess EXAM: CT GUIDED DRAINAGE OF RETROPERITONEAL ABSCESS ANESTHESIA/SEDATION: Intravenous Fentanyl and Versed were administered as conscious sedation during continuous monitoring of the patient's level of consciousness and physiological / cardiorespiratory status by the radiology RN, with a total moderate sedation time of 21 minutes. PROCEDURE: The procedure, risks,  benefits, and alternatives were explained to the patient. Questions regarding the procedure were encouraged and answered. The patient understands and consents to the procedure. Select axial scans were obtained through the lower  abdomen. The collection was localized and an appropriate skin entry site was determined and marked. The operative field was prepped with chlorhexidinein a sterile fashion, and a sterile drape was applied covering the operative field. A sterile gown and sterile gloves were used for the procedure. Local anesthesia was provided with 1% Lidocaine. Under CT fluoroscopic guidance, an 18 gauge trocar needle was advanced into the collection. Purulent material could be aspirated. An Amplatz wire advanced easily within the collection, its position confirmed on CT. Tract dilated to facilitate placement of a 10 French pigtail drain catheter. Position confirmed on CT. 30 mL of purulent aspirate were removed, sent for Gram stain and culture. Catheter secured externally with 0 Prolene suture and StatLock and placed to gravity drain bag. The patient tolerated the procedure well. COMPLICATIONS: None immediate FINDINGS: The complex right lower quadrant retroperitoneal collection was again localized. Ten French pigtail drain catheter placed without complication as above. IMPRESSION: 1. Technically successful CT-guided retroperitoneal abscess drain catheter placement Electronically Signed   By: Lucrezia Europe M.D.   On: 06/03/2017 09:30    Labs:  CBC:  Recent Labs  06/01/17 1219 06/02/17 0723 06/03/17 0942 06/04/17 0506  WBC 18.2* 15.9* 19.8* 15.3*  HGB 16.2 14.1 14.4 13.9  HCT 47.0 41.0 41.2 40.1  PLT 276 251 252 252    COAGS:  Recent Labs  06/01/17 2147  INR 1.40    BMP:  Recent Labs  06/01/17 1219 06/02/17 0723 06/03/17 0942 06/04/17 0506  NA 138 137 137 138  K 4.0 3.5 3.6 3.5  CL 100* 105 104 104  CO2 27 21* 25 24  GLUCOSE 124* 107* 106* 111*  BUN 16 13 13 13   CALCIUM 9.3  8.3* 8.4* 8.3*  CREATININE 0.96 0.89 0.97 0.85  GFRNONAA >60 >60 >60 >60  GFRAA >60 >60 >60 >60    LIVER FUNCTION TESTS:  Recent Labs  07/24/16 1525 06/01/17 1219  BILITOT 0.49 0.9  AST 20 38  ALT 17 47  ALKPHOS 61 98  PROT 7.4 7.6  ALBUMIN 3.9 3.4*    Assessment and Plan: Appendiceal abscess, status post drainage on 06/02/17; AF; WBC 15.3(19.8), blood/drain fluid cx pending; creat nl; CT yesterday stable, no new acute findings, decreased size of drained abscess; area of cellulitis around drain site- on IV vanc/zosyn; cont current tx; OOB; recheck CT in [redacted] week along with drain injection   Electronically Signed: D. Rowe Robert, PA-C 06/04/2017, 2:41 PM   I spent a total of 15 minutes at the the patient's bedside AND on the patient's hospital floor or unit, greater than 50% of which was counseling/coordinating care for abdominal abscess drain    Patient ID: Dorothy Puffer, male   DOB: [birth year], 66 y.o.   MRN: [id number]

## 2017-06-04 NOTE — Progress Notes (Addendum)
Pharmacy Antibiotic Note  John [name] is a 66 y.o. male admitted on 06/01/2017 with intra-abdominal abscess s/p drain placement 6/27 + Zosyn treatment. Today, surgery notes patient has developed cellulitis around drain. Pharmacy has been consulted for vancomcyin dosing.  Plan: Vancomycin 1g IV q12h. VT goal 10-15 mcg/ml. Continue Zosyn 3.375g IV q8h (4 hour infusion time).  Daily SCr  Height: 5\' 10"  (177.8 cm) Weight: 158 lb 8.2 oz (71.9 kg) IBW/kg (Calculated) : 73  Temp (24hrs), Avg:98.5 F (36.9 C), Min:98.5 F (36.9 C), Max:98.5 F (36.9 C)   Recent Labs Lab 05/31/17 1813 06/01/17 1219 06/01/17 1358 06/01/17 1658 06/02/17 0723 06/03/17 0942 06/04/17 0506  WBC 17.0* 18.2*  --   --  15.9* 19.8* 15.3*  CREATININE 0.99 0.96  --   --  0.89 0.97 0.85  LATICACIDVEN  --   --  1.4 0.9  --   --   --     Estimated Creatinine Clearance: 86.9 mL/min (by C-G formula based on SCr of 0.85 mg/dL).    No Known Allergies  Antimicrobials this admission: 6/26 Zosyn >>  6/29 Vancomycin >>   Dose adjustments this admission:   Microbiology results: 6/26 BCx: ngtd 6/27 abscess cx: IP  Thank you for allowing pharmacy to be a part of this patient's care.  Hershal Coria 06/04/2017 11:12 AM

## 2017-06-04 NOTE — Progress Notes (Signed)
PROGRESS NOTE    John [name]  CBJ:[id number] DOB: June 12, 1951 DOA: 06/01/2017 PCP: Cari Caraway, MD   Brief Narrative: John [name] is a 66 y.o. 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 Patient presented with abdominal pain, decrease oral intake, progressive weakness. He is found to have appendicitis with perforation and abscess. General surgery consulted for evaluation. Recommending IR percutaneous drain.   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 S/p percutaneous drain on 06/02/17. Afebrile overnight with improvement in WBC.  -General surgery/IR recommendations -Continue Zosyn -Continue IV fluids -BMP/CBC in AM -encourage oral intake; will switch to regular diet  Appendicitis w/ rupture/abscess Abdominal pain Stable -continue drain per IR  Cellulitis Over drain site. -Continue zosyn, which should cover  Somnolence Resolved. Likely secondary to dilaudid  Dementia Patient is currently not agitated/violent -Continue Ativan when necessary  History of PE Patient on Xarelto as an outpatient. This was held on admission. Started on Lovenox VTE prophylaxis dose on admission. S/p perc drain. -restart Xarelto   DVT prophylaxis: Xarelto Code Status: Full code Family Communication: Wife at bedside Disposition Plan: Discharge pending management of appendicitis, PT recommendations   Consultants:   General surgery  Interventional radiology  Procedures:   None  Antimicrobials:  Zosyn   Subjective: Agitated and appears in pain per wife.  Objective: Vitals:   06/03/17 0424 06/03/17 0557 06/03/17 2019  06/04/17 0514  BP: 94/60  92/67 97/68  Pulse: 89  78 70  Resp: 16  16 16   Temp: (!) 102 F (38.9 C) 99.3 F (37.4 C) 98.5 F (36.9 C) 98.5 F (36.9 C)  TempSrc: Axillary Oral Axillary Axillary  SpO2: 93%  93% 94%  Weight:      Height:        Intake/Output Summary (Last 24 hours) at 06/04/17 1231 Last data filed at 06/04/17 1229  Gross per 24 hour  Intake             3170 ml  Output              610 ml  Net             2560 ml   Filed Weights   06/01/17 2048 06/02/17 0948  Weight: 75.3 kg (166 lb) 71.9 kg (158 lb 8.2 oz)    Examination:  General exam: No distress Respiratory system: Clear to auscultation bilaterally. Unlabored work of breathing. No wheezing or rales. Cardiovascular system: Regular rate and rhythm. Normal S1 and S2. No heart murmurs present. No extra heart sounds Gastrointestinal system: Non-distended, soft, tenderness diffusely. Central nervous system: Alert and not oriented x3.  Extremities: No edema. No calf tenderness Skin: No cyanosis. Erythematous rash over drain site. No overt warmth Psychiatry: Judgement and insight impaired. Flat affect.     Data Reviewed: I have personally reviewed following labs and imaging studies  CBC:  Recent Labs Lab 05/31/17 1813 06/01/17 1219 06/02/17 0723 06/03/17 0942 06/04/17 0506  WBC 17.0* 18.2* 15.9* 19.8* 15.3*  NEUTROABS  --  13.9*  --  16.6*  --   HGB 15.9 16.2 14.1 14.4 13.9  HCT 48.2 47.0 41.0 41.2 40.1  MCV 98.4 95.9 94.0 92.8 92.4  PLT 320 276 251 252 188   Basic Metabolic Panel:  Recent Labs Lab 05/31/17 1813 06/01/17 1219 06/02/17 0723 06/03/17 0942 06/04/17 0506  NA 135 138 137 137 138  K 3.9 4.0 3.5 3.6 3.5  CL 100* 100* 105 104 104  CO2 24 27 21* 25 24  GLUCOSE 160* 124* 107* 106* 111*  BUN 11 16 13 13 13   CREATININE 0.99 0.96 0.89 0.97 0.85  CALCIUM 9.4 9.3 8.3* 8.4* 8.3*   GFR: Estimated Creatinine Clearance: 86.9 mL/min (by C-G formula based on SCr of 0.85 mg/dL). Liver  Function Tests:  Recent Labs Lab 06/01/17 1219  AST 38  ALT 47  ALKPHOS 98  BILITOT 0.9  PROT 7.6  ALBUMIN 3.4*   No results for input(s): LIPASE, AMYLASE in the last 168 hours. No results for input(s): AMMONIA in the last 168 hours. Coagulation Profile:  Recent Labs Lab 06/01/17 2147  INR 1.40   Cardiac Enzymes:  Recent Labs Lab 06/01/17 1354  TROPONINI <0.03   BNP (last 3 results) No results for input(s): PROBNP in the last 8760 hours. HbA1C: No results for input(s): HGBA1C in the last 72 hours. CBG:  Recent Labs Lab 06/01/17 1221  GLUCAP 113*   Lipid Profile: No results for input(s): CHOL, HDL, LDLCALC, TRIG, CHOLHDL, LDLDIRECT in the last 72 hours. Thyroid Function Tests: No results for input(s): TSH, T4TOTAL, FREET4, T3FREE, THYROIDAB in the last 72 hours. Anemia Panel: No results for input(s): VITAMINB12, FOLATE, FERRITIN, TIBC, IRON, RETICCTPCT in the last 72 hours. Sepsis Labs:  Recent Labs Lab 06/01/17 1358 06/01/17 1658 06/01/17 2147  PROCALCITON  --   --  0.42  LATICACIDVEN 1.4 0.9  --     Recent Results (from the past 240 hour(s))  Culture, blood (routine x 2)     Status: None (Preliminary result)   Collection Time: 06/01/17  3:19 PM  Result Value Ref Range Status   Specimen Description BLOOD RIGHT ANTECUBITAL  Final   Special Requests   Final    BOTTLES DRAWN AEROBIC AND ANAEROBIC Blood Culture adequate volume   Culture   Final    NO GROWTH 2 DAYS Performed at [facility], [place] [address], [place], [place] [address]    Report Status PENDING  Incomplete  Culture, blood (routine x 2)     Status: None (Preliminary result)   Collection Time: 06/01/17  4:04 PM  Result Value Ref Range Status   Specimen Description BLOOD LEFT FOREARM  Final   Special Requests   Final    BOTTLES DRAWN AEROBIC AND ANAEROBIC Blood Culture adequate volume   Culture   Final    NO GROWTH 2 DAYS Performed at [facility], [place] [address]  [place], [place] [address]    Report Status PENDING  Incomplete  Surgical PCR screen     Status: None   Collection Time: 06/02/17  4:04 PM  Result Value Ref Range Status   MRSA, PCR NEGATIVE NEGATIVE Final   Staphylococcus aureus NEGATIVE NEGATIVE Final    Comment:        The Xpert SA Assay (FDA approved for NASAL specimens in patients over 70 years of age), is one component of a comprehensive surveillance program.  Test performance has been validated by [facility] for patients greater than or equal to 77 year old. It is not intended to diagnose infection nor to guide or monitor treatment.   Aerobic/Anaerobic Culture (surgical/deep wound)  Status: None (Preliminary result)   Collection Time: 06/02/17  5:10 PM  Result Value Ref Range Status   Specimen Description ABSCESS RLQ CT DRAIN  Final   Special Requests NONE  Final   Gram Stain   Final    MODERATE WBC PRESENT, PREDOMINANTLY PMN ABUNDANT GRAM POSITIVE COCCI IN PAIRS ABUNDANT GRAM NEGATIVE COCCOBACILLI FEW GRAM POSITIVE RODS Performed at [facility], [place] [address], [place], [place] [address]    Culture PENDING  Incomplete   Report Status PENDING  Incomplete         Radiology Studies: Ct Abdomen Pelvis W Contrast  Result Date: 06/03/2017 CLINICAL DATA:  Drain placed yesterday EXAM: CT ABDOMEN AND PELVIS WITH CONTRAST TECHNIQUE: Multidetector CT imaging of the abdomen and pelvis was performed using the standard protocol following bolus administration of intravenous contrast. CONTRAST:  148mL ISOVUE-300 IOPAMIDOL (ISOVUE-300) INJECTION 61% COMPARISON:  06/01/2017 FINDINGS: Lower chest: Dependent atelectasis in the lower lobes. Heart is borderline in size. Scattered coronary artery calcifications. Hepatobiliary: No focal hepatic abnormality. Gallbladder unremarkable. Pancreas: No focal abnormality or ductal dilatation. Spleen: No focal abnormality.  Normal size. Adrenals/Urinary Tract: Bilateral renal parapelvic  cysts. No hydronephrosis. Adrenal glands and urinary bladder unremarkable. Stomach/Bowel: Changes of ruptured appendicitis again noted. There is sigmoid diverticulosis. No active diverticulitis. Stomach and small bowel decompressed. Vascular/Lymphatic: No evidence of aneurysm or adenopathy. Reproductive: Mild prostate prominence. Other: Right lower quadrant fluid collection has decreased with right lower quadrant drain in place. Fluid collection measures approximately 3.6 x 2.9 cm compared to 6.1 x 5.2 cm previously. No free fluid or free air. Bilateral inguinal hernias containing fat. Musculoskeletal: No acute bony abnormality. IMPRESSION: Decreasing size of the right lower quadrant retroperitoneal abscess post drain placement. Continued inflamed adjacent appendix compatible with ruptured appendicitis. Sigmoid diverticulosis. Dependent bibasilar atelectasis. Electronically Signed   By: Rolm Baptise M.D.   On: 06/03/2017 15:37   Dg Abd Portable 1v  Result Date: 06/03/2017 CLINICAL DATA:  Abdominal pain . EXAM: PORTABLE ABDOMEN - 1 VIEW COMPARISON:  CT 06/02/2017. FINDINGS: Catheter noted over the right abdomen in stable position. No bowel distention. Prior hernia repair. Degenerative changes lumbar spine with scoliosis concave left . IMPRESSION: 1. Right abdominal drainage catheter in stable position. 2. No acute abnormality.  No bowel distention. Electronically Signed   By: Marcello Moores  Register   On: 06/03/2017 10:28   Ct Image Guided Drainage By Percutaneous Catheter  Result Date: 06/03/2017 CLINICAL DATA:  Appendiceal abscess EXAM: CT GUIDED DRAINAGE OF RETROPERITONEAL ABSCESS ANESTHESIA/SEDATION: Intravenous Fentanyl and Versed were administered as conscious sedation during continuous monitoring of the patient's level of consciousness and physiological / cardiorespiratory status by the radiology RN, with a total moderate sedation time of 21 minutes. PROCEDURE: The procedure, risks, benefits, and alternatives  were explained to the patient. Questions regarding the procedure were encouraged and answered. The patient understands and consents to the procedure. Select axial scans were obtained through the lower abdomen. The collection was localized and an appropriate skin entry site was determined and marked. The operative field was prepped with chlorhexidinein a sterile fashion, and a sterile drape was applied covering the operative field. A sterile gown and sterile gloves were used for the procedure. Local anesthesia was provided with 1% Lidocaine. Under CT fluoroscopic guidance, an 18 gauge trocar needle was advanced into the collection. Purulent material could be aspirated. An Amplatz wire advanced easily within the collection, its position confirmed on CT. Tract dilated to facilitate placement of a 10 French pigtail  drain catheter. Position confirmed on CT. 30 mL of purulent aspirate were removed, sent for Gram stain and culture. Catheter secured externally with 0 Prolene suture and StatLock and placed to gravity drain bag. The patient tolerated the procedure well. COMPLICATIONS: None immediate FINDINGS: The complex right lower quadrant retroperitoneal collection was again localized. Ten French pigtail drain catheter placed without complication as above. IMPRESSION: 1. Technically successful CT-guided retroperitoneal abscess drain catheter placement Electronically Signed   By: Lucrezia Europe M.D.   On: 06/03/2017 09:30        Scheduled Meds: . enoxaparin (LOVENOX) injection  40 mg Subcutaneous Q24H  . feeding supplement  1 Container Oral TID BM  . folic acid  1 mg Intravenous Daily  . magnesium hydroxide  30 mL Oral Daily  . thiamine  100 mg Intravenous Daily   Continuous Infusions: . sodium chloride 100 mL/hr at 06/04/17 1045  . famotidine (PEPCID) IV Stopped (06/04/17 1114)  . piperacillin-tazobactam (ZOSYN)  IV Stopped (06/04/17 1144)  . vancomycin 1,000 mg (06/04/17 1229)     LOS: 3 days      Cordelia Poche, MD [facility] 06/04/2017, 12:31 PM Pager: [phone]  If 7PM-7AM, please contact night-coverage [email or web address] Password [facility] [place] 06/04/2017, 12:31 PM

## 2017-06-04 NOTE — Progress Notes (Signed)
[facility] Progress Note     Subjective: CC: Intra-abdominal abscess Patient's wife present at bedside. Concerned that he seems angry and uncomfortable. No BM in the last 2 weeks. Discussed results of abdominal XR and repeat CT from yesterday.  VSS.   Objective: Vital signs in last 24 hours: Temp:  [98.5 F (36.9 C)] 98.5 F (36.9 C) (06/29 0514) Pulse Rate:  [70-78] 70 (06/29 0514) Resp:  [16] 16 (06/29 0514) BP: (92-97)/(67-68) 97/68 (06/29 0514) SpO2:  [93 %-94 %] 94 % (06/29 0514) Last BM Date: 05/29/17  Intake/Output from previous day: 06/28 0701 - 06/29 0700 In: 2770 [P.O.:120; I.V.:2400; IV Piggyback:250] Out: 61 [Urine:600; Drains:10] Intake/Output this shift: Total I/O In: 62 [IV Piggyback:50] Out: -   PE: Gen:  NAD, alert Card:  Regular rate and rhythm, no M/G/R appreciated Pulm:  Normal effort, clear to auscultation bilaterally Abd: Soft, TTP of RLQ, non-distended, bowel sounds present in all 4 quadrants. drain in RLQ with purulent bloody drainage present, some erythema around, no induration. Skin: warm and dry, no rashes  Psych: Oriented only to self  Lab Results:   Recent Labs  06/03/17 0942 06/04/17 0506  WBC 19.8* 15.3*  HGB 14.4 13.9  HCT 41.2 40.1  PLT 252 252   BMET  Recent Labs  06/03/17 0942 06/04/17 0506  NA 137 138  K 3.6 3.5  CL 104 104  CO2 25 24  GLUCOSE 106* 111*  BUN 13 13  CREATININE 0.97 0.85  CALCIUM 8.4* 8.3*   PT/INR  Recent Labs  06/01/17 2147  LABPROT 17.3*  INR 1.40   CMP     Component Value Date/Time   NA 138 06/04/2017 0506   NA 141 07/24/2016 1525   K 3.5 06/04/2017 0506   K 4.4 07/24/2016 1525   CL 104 06/04/2017 0506   CO2 24 06/04/2017 0506   CO2 27 07/24/2016 1525   GLUCOSE 111 (H) 06/04/2017 0506   GLUCOSE 95 07/24/2016 1525   BUN 13 06/04/2017 0506   BUN 14.8 07/24/2016 1525   CREATININE 0.85 06/04/2017 0506   CREATININE 0.9 07/24/2016 1525   CALCIUM 8.3 (L) 06/04/2017  0506   CALCIUM 10.0 07/24/2016 1525   PROT 7.6 06/01/2017 1219   PROT 7.4 07/24/2016 1525   ALBUMIN 3.4 (L) 06/01/2017 1219   ALBUMIN 3.9 07/24/2016 1525   AST 38 06/01/2017 1219   AST 20 07/24/2016 1525   ALT 47 06/01/2017 1219   ALT 17 07/24/2016 1525   ALKPHOS 98 06/01/2017 1219   ALKPHOS 61 07/24/2016 1525   BILITOT 0.9 06/01/2017 1219   BILITOT 0.49 07/24/2016 1525   GFRNONAA >60 06/04/2017 0506   GFRAA >60 06/04/2017 0506     Studies/Results: Ct Abdomen Pelvis W Contrast  Result Date: 06/03/2017 CLINICAL DATA:  Drain placed yesterday EXAM: CT ABDOMEN AND PELVIS WITH CONTRAST TECHNIQUE: Multidetector CT imaging of the abdomen and pelvis was performed using the standard protocol following bolus administration of intravenous contrast. CONTRAST:  153mL ISOVUE-300 IOPAMIDOL (ISOVUE-300) INJECTION 61% COMPARISON:  06/01/2017 FINDINGS: Lower chest: Dependent atelectasis in the lower lobes. Heart is borderline in size. Scattered coronary artery calcifications. Hepatobiliary: No focal hepatic abnormality. Gallbladder unremarkable. Pancreas: No focal abnormality or ductal dilatation. Spleen: No focal abnormality.  Normal size. Adrenals/Urinary Tract: Bilateral renal parapelvic cysts. No hydronephrosis. Adrenal glands and urinary bladder unremarkable. Stomach/Bowel: Changes of ruptured appendicitis again noted. There is sigmoid diverticulosis. No active diverticulitis. Stomach and small bowel decompressed. Vascular/Lymphatic: No evidence of aneurysm or  adenopathy. Reproductive: Mild prostate prominence. Other: Right lower quadrant fluid collection has decreased with right lower quadrant drain in place. Fluid collection measures approximately 3.6 x 2.9 cm compared to 6.1 x 5.2 cm previously. No free fluid or free air. Bilateral inguinal hernias containing fat. Musculoskeletal: No acute bony abnormality. IMPRESSION: Decreasing size of the right lower quadrant retroperitoneal abscess post drain  placement. Continued inflamed adjacent appendix compatible with ruptured appendicitis. Sigmoid diverticulosis. Dependent bibasilar atelectasis. Electronically Signed   By: Rolm Baptise M.D.   On: 06/03/2017 15:37   Dg Abd Portable 1v  Result Date: 06/03/2017 CLINICAL DATA:  Abdominal pain . EXAM: PORTABLE ABDOMEN - 1 VIEW COMPARISON:  CT 06/02/2017. FINDINGS: Catheter noted over the right abdomen in stable position. No bowel distention. Prior hernia repair. Degenerative changes lumbar spine with scoliosis concave left . IMPRESSION: 1. Right abdominal drainage catheter in stable position. 2. No acute abnormality.  No bowel distention. Electronically Signed   By: Marcello Moores  Register   On: 06/03/2017 10:28   Ct Image Guided Drainage By Percutaneous Catheter  Result Date: 06/03/2017 CLINICAL DATA:  Appendiceal abscess EXAM: CT GUIDED DRAINAGE OF RETROPERITONEAL ABSCESS ANESTHESIA/SEDATION: Intravenous Fentanyl and Versed were administered as conscious sedation during continuous monitoring of the patient's level of consciousness and physiological / cardiorespiratory status by the radiology RN, with a total moderate sedation time of 21 minutes. PROCEDURE: The procedure, risks, benefits, and alternatives were explained to the patient. Questions regarding the procedure were encouraged and answered. The patient understands and consents to the procedure. Select axial scans were obtained through the lower abdomen. The collection was localized and an appropriate skin entry site was determined and marked. The operative field was prepped with chlorhexidinein a sterile fashion, and a sterile drape was applied covering the operative field. A sterile gown and sterile gloves were used for the procedure. Local anesthesia was provided with 1% Lidocaine. Under CT fluoroscopic guidance, an 18 gauge trocar needle was advanced into the collection. Purulent material could be aspirated. An Amplatz wire advanced easily within the  collection, its position confirmed on CT. Tract dilated to facilitate placement of a 10 French pigtail drain catheter. Position confirmed on CT. 30 mL of purulent aspirate were removed, sent for Gram stain and culture. Catheter secured externally with 0 Prolene suture and StatLock and placed to gravity drain bag. The patient tolerated the procedure well. COMPLICATIONS: None immediate FINDINGS: The complex right lower quadrant retroperitoneal collection was again localized. Ten French pigtail drain catheter placed without complication as above. IMPRESSION: 1. Technically successful CT-guided retroperitoneal abscess drain catheter placement Electronically Signed   By: Lucrezia Europe M.D.   On: 06/03/2017 09:30    Anti-infectives: Anti-infectives    Start     Dose/Rate Route Frequency Ordered Stop   06/02/17 0000  piperacillin-tazobactam (ZOSYN) IVPB 3.375 g     3.375 g 12.5 mL/hr over 240 Minutes Intravenous Every 8 hours 06/01/17 2103     06/01/17 1745  piperacillin-tazobactam (ZOSYN) IVPB 3.375 g     3.375 g 100 mL/hr over 30 Minutes Intravenous  Once 06/01/17 1730 06/01/17 1839   06/01/17 1730  piperacillin-tazobactam (ZOSYN) IVPB 4.5 g  Status:  Discontinued     4.5 g 200 mL/hr over 30 Minutes Intravenous  Once 06/01/17 1728 06/01/17 1730       Assessment/Plan Probable ruptured appendicitis w/ abscess - Continue IV abx, IVF, clear liquids - IR perc drain 6/27 - 10 cc output in last 24 hr - WBC 15.3 this AM, trending  down. Afebrile. 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 Continue IV abx.    LOS: 3 days    Brigid Re , [facility] 06/04/2017, 7:57 AM Pager: [phone] Consults: [phone] Mon-Fri 7:00 am-4:30 pm Sat-Sun 7:00 am-11:30 am

## 2017-06-05 DIAGNOSIS — R10813 Right lower quadrant abdominal tenderness: Secondary | ICD-10-CM

## 2017-06-05 DIAGNOSIS — L03311 Cellulitis of abdominal wall: Secondary | ICD-10-CM

## 2017-06-05 LAB — CREATININE, SERUM
CREATININE: 0.74 mg/dL (ref 0.61–1.24)
GFR calc Af Amer: >60 mL/min (ref >60)

## 2017-06-05 LAB — CBC
HCT: 40.2 % (ref 39.0–52.0)
Hemoglobin: 13.9 g/dL (ref 13.0–17.0)
MCH: 32.5 pg (ref 26.0–34.0)
MCHC: 34.6 g/dL (ref 30.0–36.0)
MCV: 93.9 fL (ref 78.0–100.0)
PLATELETS: 280 10*3/uL (ref 150–400)
RBC: 4.28 MIL/uL (ref 4.22–5.81)
RDW: 13.6 % (ref 11.5–15.5)
WBC: 17.2 10*3/uL — ABNORMAL HIGH (ref 4.0–10.5)

## 2017-06-05 NOTE — Evaluation (Signed)
Physical Therapy Evaluation Patient Details Name: John [name] MRN: [id number] DOB: [birth year] Today's Date: 06/05/2017   History of Present Illness  John [name] is a 66 y.o. 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  Probable ruptured appendix with abcess.  Drain placed  On right side abdomen.  Clinical Impression  The patient's wife present. The patient did not really participate in  Mobility. Required 2 assist to sit up and to lie back down. The patient did not attempt to stand  Without maximum encouragement. Pt admitted with above diagnosis. Pt currently with functional limitations due to the deficits listed below (see PT Problem List).  Pt will benefit from skilled PT to increase their independence and safety with mobility to allow discharge to the venue listed below.       Follow Up Recommendations Home health PT;SNF;Supervision/Assistance - 24 hour (depends on progress and MS.)    Equipment Recommendations  None recommended by PT    Recommendations for Other Services       Precautions / Restrictions Precautions Precautions: Fall Precaution Comments: easily agitated, right drain from abdomen, does better with family present      Mobility  Bed Mobility Overal bed mobility: Needs Assistance Bed Mobility: Supine to Sit;Sit to Supine     Supine to sit: +2 for physical assistance;+2 for safety/equipment;HOB elevated Sit to supine: +2 for safety/equipment;+2 for physical assistance   General bed mobility comments: with much encouragement and extra time, assisted legs and trunk to sitting on bed edge. then total to return to supine as patient was not following directions.  Transfers Overall  transfer level: Needs assistance Equipment used: 2 person hand held assist;Rolling walker (2 wheeled) Transfers: Sit to/from Stand Sit to Stand: +2 physical assistance;+2 safety/equipment         General transfer comment: attempted partial stand x 1 with much encouragement from wife   but patient does not follow.  Ambulation/Gait                Stairs            Wheelchair Mobility    Modified Rankin (Stroke Patients Only)       Balance Overall balance assessment: Needs assistance Sitting-balance support: Bilateral upper extremity supported Sitting balance-Leahy Scale: Fair Sitting balance - Comments: once in sitting, sits at midline.                                     Pertinent Vitals/Pain Pain Assessment: Faces Faces Pain Scale: Hurts whole lot Pain Location: indicates right lower qudrant Pain Descriptors / Indicators: Moaning Pain Intervention(s): Relaxation    Home Living Family/patient expects to be discharged to:: Private residence Living Arrangements: Spouse/significant other;Children Available Help at Discharge: Family;Available 24 hours/day Type of Home: House Home Access: Level entry     Home Layout: Two level Home Equipment: None      Prior Function Level of Independence: Needs assistance   Gait / Transfers Assistance Needed: ambulatory without assistance  ADL's / Homemaking Assistance Needed: wife assisted with shower, finger feeds self        Hand Dominance        Extremity/Trunk  Assessment   Upper Extremity Assessment Upper Extremity Assessment: Difficult to assess due to impaired cognition    Lower Extremity Assessment Lower Extremity Assessment: Difficult to assess due to impaired cognition    Cervical / Trunk Assessment Cervical / Trunk Assessment: Normal  Communication      Cognition Arousal/Alertness: Awake/alert Behavior During Therapy: Agitated Overall Cognitive Status: Impaired/Different  from baseline Area of Impairment: Following commands                               General Comments: per wife, patient does follow simple commands PTA, now does not follow wife's instruction.      General Comments      Exercises     Assessment/Plan    PT Assessment Patient needs continued PT services  PT Problem List Decreased activity tolerance;Decreased mobility;Decreased cognition;Decreased knowledge of use of DME;Decreased safety awareness;Decreased knowledge of precautions;Pain       PT Treatment Interventions Gait training;DME instruction;Functional mobility training;Therapeutic activities;Therapeutic exercise;Patient/family education;Cognitive remediation    PT Goals (Current goals can be found in the Care Plan section)  Acute Rehab PT Goals Patient Stated Goal: to walk again PT Goal Formulation: With family Time For Goal Achievement: 06/19/17 Potential to Achieve Goals: Fair    Frequency Min 3X/week   Barriers to discharge        Co-evaluation               AM-PAC PT "6 Clicks" Daily Activity  Outcome Measure Difficulty turning over in bed (including adjusting bedclothes, sheets and blankets)?: Total Difficulty moving from lying on back to sitting on the side of the bed? : Total Difficulty sitting down on and standing up from a chair with arms (e.g., wheelchair, bedside commode, etc,.)?: Total Help needed moving to and from a bed to chair (including a wheelchair)?: Total Help needed walking in hospital room?: Total Help needed climbing 3-5 steps with a railing? : Total 6 Click Score: 6    End of Session   Activity Tolerance: Patient limited by pain;Treatment limited secondary to agitation Patient left: in bed;with call bell/phone within reach;with bed alarm set;with family/visitor present Nurse Communication: Mobility status PT Visit Diagnosis: Unsteadiness on feet (R26.81)    Time: 8101-7510 PT Time Calculation (min) (ACUTE ONLY): 30  min   Charges:   PT Evaluation $PT Eval Moderate Complexity: 1 Procedure PT Treatments $Therapeutic Activity: 8-22 mins   PT G CodesTresa Endo PT [phone]   Claretha Cooper 06/05/2017, 4:13 PM

## 2017-06-05 NOTE — Progress Notes (Signed)
PROGRESS NOTE    John [name]  JJO:[id number] DOB: 1951-10-21 DOA: 06/01/2017 PCP: Cari Caraway, MD   Brief Narrative: John [name] is a 66 y.o. 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 Patient presented with abdominal pain, decrease oral intake, progressive weakness. He is found to have appendicitis with perforation and abscess. General surgery consulted for evaluation. Recommending IR percutaneous drain.   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 S/p percutaneous drain on 06/02/17. Afebrile overnight with improvement in WBC.  -General surgery/IR recommendations -Continue Zosyn -Continue IV fluids -CBC pending  Appendicitis w/ rupture/abscess Abdominal pain Stable -continue drain per IR -cultures pending  Cellulitis Over drain site. Extended. Some vesicles. -Continue zosyn -continue vancomycin  Somnolence Resolved. Likely secondary to dilaudid  Dementia Patient is currently not agitated/violent -Continue Ativan when necessary  History of PE Patient on Xarelto as an outpatient. This was held on admission. Started on Lovenox VTE prophylaxis dose on admission. S/p perc drain. -continue Xarelto   DVT prophylaxis: Xarelto Code Status: Full code Family Communication: Wife at bedside Disposition Plan: Discharge pending management of appendicitis, PT recommendations   Consultants:   General surgery  Interventional radiology  Procedures:   None  Antimicrobials:  Zosyn   Subjective: Afebrile.  Objective: Vitals:   06/04/17 0514 06/04/17 1359 06/04/17 2100 06/05/17 0537  BP: 97/68 127/62 112/71  116/78  Pulse: 70 91 80 70  Resp: 16 18 18 18   Temp: 98.5 F (36.9 C) 98.6 F (37 C) 98.6 F (37 C) 98.6 F (37 C)  TempSrc: Axillary Axillary Oral Oral  SpO2: 94% 93% 98% 96%  Weight:      Height:        Intake/Output Summary (Last 24 hours) at 06/05/17 1123 Last data filed at 06/05/17 0600  Gross per 24 hour  Intake          2113.33 ml  Output              980 ml  Net          1133.33 ml   Filed Weights   06/01/17 2048 06/02/17 0948  Weight: 75.3 kg (166 lb) 71.9 kg (158 lb 8.2 oz)    Examination:  General exam: No distress Respiratory system: Clear to auscultation bilaterally. Unlabored work of breathing. No wheezing or rales. Cardiovascular system: Regular rate and rhythm. Normal S1 and S2. No heart murmurs present. No extra heart sounds Gastrointestinal system: Non-distended, soft, tenderness diffusely. Central nervous system: Alert and not oriented x3.  Extremities: No edema. No calf tenderness Skin: No cyanosis. Erythematous rash over drain site that has extended since yesterday with tenderness and two vesicles. Psychiatry: Judgement and insight impaired. Flat affect.     Data Reviewed: I have personally reviewed following labs and imaging studies  CBC:  Recent Labs Lab 05/31/17 1813 06/01/17 1219 06/02/17 0723 06/03/17 0942 06/04/17 0506  WBC 17.0* 18.2* 15.9* 19.8* 15.3*  NEUTROABS  --  13.9*  --  16.6*  --   HGB 15.9 16.2 14.1 14.4 13.9  HCT 48.2 47.0 41.0 41.2 40.1  MCV 98.4 95.9 94.0 92.8 92.4  PLT 320 276 251  252 962   Basic Metabolic Panel:  Recent Labs Lab 05/31/17 1813 06/01/17 1219 06/02/17 0723 06/03/17 0942 06/04/17 0506 06/05/17 0602  NA 135 138 137 137 138  --   K 3.9 4.0 3.5 3.6 3.5  --   CL 100* 100* 105 104 104  --   CO2 24 27 21* 25 24  --   GLUCOSE 160* 124* 107* 106* 111*  --   BUN 11 16 13 13 13   --   CREATININE 0.99 0.96 0.89 0.97 0.85 0.74  CALCIUM 9.4 9.3 8.3* 8.4* 8.3*  --    GFR: Estimated Creatinine  Clearance: 92.4 mL/min (by C-G formula based on SCr of 0.74 mg/dL). Liver Function Tests:  Recent Labs Lab 06/01/17 1219  AST 38  ALT 47  ALKPHOS 98  BILITOT 0.9  PROT 7.6  ALBUMIN 3.4*   No results for input(s): LIPASE, AMYLASE in the last 168 hours. No results for input(s): AMMONIA in the last 168 hours. Coagulation Profile:  Recent Labs Lab 06/01/17 2147  INR 1.40   Cardiac Enzymes:  Recent Labs Lab 06/01/17 1354  TROPONINI <0.03   BNP (last 3 results) No results for input(s): PROBNP in the last 8760 hours. HbA1C: No results for input(s): HGBA1C in the last 72 hours. CBG:  Recent Labs Lab 06/01/17 1221  GLUCAP 113*   Lipid Profile: No results for input(s): CHOL, HDL, LDLCALC, TRIG, CHOLHDL, LDLDIRECT in the last 72 hours. Thyroid Function Tests: No results for input(s): TSH, T4TOTAL, FREET4, T3FREE, THYROIDAB in the last 72 hours. Anemia Panel: No results for input(s): VITAMINB12, FOLATE, FERRITIN, TIBC, IRON, RETICCTPCT in the last 72 hours. Sepsis Labs:  Recent Labs Lab 06/01/17 1358 06/01/17 1658 06/01/17 2147  PROCALCITON  --   --  0.42  LATICACIDVEN 1.4 0.9  --     Recent Results (from the past 240 hour(s))  Culture, blood (routine x 2)     Status: None (Preliminary result)   Collection Time: 06/01/17  3:19 PM  Result Value Ref Range Status   Specimen Description BLOOD RIGHT ANTECUBITAL  Final   Special Requests   Final    BOTTLES DRAWN AEROBIC AND ANAEROBIC Blood Culture adequate volume   Culture   Final    NO GROWTH 3 DAYS Performed at [facility], [place] [address], [place], [place] [address]    Report Status PENDING  Incomplete  Culture, blood (routine x 2)     Status: None (Preliminary result)   Collection Time: 06/01/17  4:04 PM  Result Value Ref Range Status   Specimen Description BLOOD LEFT FOREARM  Final   Special Requests   Final    BOTTLES DRAWN AEROBIC AND ANAEROBIC Blood Culture adequate volume   Culture   Final     NO GROWTH 3 DAYS Performed at [facility], [place] [address], [place], [place] [address]    Report Status PENDING  Incomplete  Surgical PCR screen     Status: None   Collection Time: 06/02/17  4:04 PM  Result Value Ref Range Status   MRSA, PCR NEGATIVE NEGATIVE Final   Staphylococcus aureus NEGATIVE NEGATIVE Final    Comment:        The Xpert SA Assay (FDA approved for NASAL specimens in patients over 63 years of age), is one component of a comprehensive surveillance program.  Test performance has been validated by [facility] for patients greater than or equal to 48 year old. It is not intended to diagnose  infection nor to guide or monitor treatment.   Aerobic/Anaerobic Culture (surgical/deep wound)     Status: None (Preliminary result)   Collection Time: 06/02/17  5:10 PM  Result Value Ref Range Status   Specimen Description ABSCESS RLQ CT DRAIN  Final   Special Requests NONE  Final   Gram Stain   Final    MODERATE WBC PRESENT, PREDOMINANTLY PMN ABUNDANT GRAM POSITIVE COCCI IN PAIRS ABUNDANT GRAM NEGATIVE COCCOBACILLI FEW GRAM POSITIVE RODS    Culture   Final    CULTURE REINCUBATED FOR BETTER GROWTH Performed at [facility], [place] [address] [place], [place] [address]    Report Status PENDING  Incomplete         Radiology Studies: Ct Abdomen Pelvis W Contrast  Result Date: 06/03/2017 CLINICAL DATA:  Drain placed yesterday EXAM: CT ABDOMEN AND PELVIS WITH CONTRAST TECHNIQUE: Multidetector CT imaging of the abdomen and pelvis was performed using the standard protocol following bolus administration of intravenous contrast. CONTRAST:  174mL ISOVUE-300 IOPAMIDOL (ISOVUE-300) INJECTION 61% COMPARISON:  06/01/2017 FINDINGS: Lower chest: Dependent atelectasis in the lower lobes. Heart is borderline in size. Scattered coronary artery calcifications. Hepatobiliary: No focal hepatic abnormality. Gallbladder unremarkable. Pancreas: No focal abnormality or ductal  dilatation. Spleen: No focal abnormality.  Normal size. Adrenals/Urinary Tract: Bilateral renal parapelvic cysts. No hydronephrosis. Adrenal glands and urinary bladder unremarkable. Stomach/Bowel: Changes of ruptured appendicitis again noted. There is sigmoid diverticulosis. No active diverticulitis. Stomach and small bowel decompressed. Vascular/Lymphatic: No evidence of aneurysm or adenopathy. Reproductive: Mild prostate prominence. Other: Right lower quadrant fluid collection has decreased with right lower quadrant drain in place. Fluid collection measures approximately 3.6 x 2.9 cm compared to 6.1 x 5.2 cm previously. No free fluid or free air. Bilateral inguinal hernias containing fat. Musculoskeletal: No acute bony abnormality. IMPRESSION: Decreasing size of the right lower quadrant retroperitoneal abscess post drain placement. Continued inflamed adjacent appendix compatible with ruptured appendicitis. Sigmoid diverticulosis. Dependent bibasilar atelectasis. Electronically Signed   By: Rolm Baptise M.D.   On: 06/03/2017 15:37        Scheduled Meds: . feeding supplement  1 Container Oral TID BM  . folic acid  1 mg Intravenous Daily  . magnesium hydroxide  30 mL Oral Daily  . rivaroxaban  20 mg Oral Q supper  . thiamine  100 mg Intravenous Daily   Continuous Infusions: . sodium chloride 100 mL/hr at 06/05/17 0038  . famotidine (PEPCID) IV Stopped (06/05/17 1047)  . piperacillin-tazobactam (ZOSYN)  IV 3.375 g (06/05/17 0759)  . vancomycin Stopped (06/05/17 0138)     LOS: 4 days     Cordelia Poche, MD [facility] 06/05/2017, 11:23 AM Pager: [phone]  If 7PM-7AM, please contact night-coverage [email or web address] Password [email or web address] 06/05/2017, 11:23 AM

## 2017-06-05 NOTE — Progress Notes (Signed)
Subjective/Chief Complaint: No complaints. He has significant dementia   Objective: Vital signs in last 24 hours: Temp:  [98.6 F (37 C)] 98.6 F (37 C) (06/30 0537) Pulse Rate:  [70-91] 70 (06/30 0537) Resp:  [18] 18 (06/30 0537) BP: (112-127)/(62-78) 116/78 (06/30 0537) SpO2:  [93 %-98 %] 96 % (06/30 0537) Last BM Date: 06/04/17  Intake/Output from previous day: 06/29 0701 - 06/30 0700 In: 2613.3 [P.O.:380; I.V.:1833.3; IV Piggyback:400] Out: 980 [Urine:950; Drains:30] Intake/Output this shift: No intake/output data recorded.  General appearance: alert and cooperative Resp: clear to auscultation bilaterally Cardio: regular rate and rhythm GI: soft, moderate tenderness on right with cellulitis around drain site. seems stable  Lab Results:   Recent Labs  06/03/17 0942 06/04/17 0506  WBC 19.8* 15.3*  HGB 14.4 13.9  HCT 41.2 40.1  PLT 252 252   BMET  Recent Labs  06/03/17 0942 06/04/17 0506 06/05/17 0602  NA 137 138  --   K 3.6 3.5  --   CL 104 104  --   CO2 25 24  --   GLUCOSE 106* 111*  --   BUN 13 13  --   CREATININE 0.97 0.85 0.74  CALCIUM 8.4* 8.3*  --    PT/INR No results for input(s): LABPROT, INR in the last 72 hours. ABG No results for input(s): PHART, HCO3 in the last 72 hours.  Invalid input(s): PCO2, PO2  Studies/Results: Ct Abdomen Pelvis W Contrast  Result Date: 06/03/2017 CLINICAL DATA:  Drain placed yesterday EXAM: CT ABDOMEN AND PELVIS WITH CONTRAST TECHNIQUE: Multidetector CT imaging of the abdomen and pelvis was performed using the standard protocol following bolus administration of intravenous contrast. CONTRAST:  114mL ISOVUE-300 IOPAMIDOL (ISOVUE-300) INJECTION 61% COMPARISON:  06/01/2017 FINDINGS: Lower chest: Dependent atelectasis in the lower lobes. Heart is borderline in size. Scattered coronary artery calcifications. Hepatobiliary: No focal hepatic abnormality. Gallbladder unremarkable. Pancreas: No focal abnormality or  ductal dilatation. Spleen: No focal abnormality.  Normal size. Adrenals/Urinary Tract: Bilateral renal parapelvic cysts. No hydronephrosis. Adrenal glands and urinary bladder unremarkable. Stomach/Bowel: Changes of ruptured appendicitis again noted. There is sigmoid diverticulosis. No active diverticulitis. Stomach and small bowel decompressed. Vascular/Lymphatic: No evidence of aneurysm or adenopathy. Reproductive: Mild prostate prominence. Other: Right lower quadrant fluid collection has decreased with right lower quadrant drain in place. Fluid collection measures approximately 3.6 x 2.9 cm compared to 6.1 x 5.2 cm previously. No free fluid or free air. Bilateral inguinal hernias containing fat. Musculoskeletal: No acute bony abnormality. IMPRESSION: Decreasing size of the right lower quadrant retroperitoneal abscess post drain placement. Continued inflamed adjacent appendix compatible with ruptured appendicitis. Sigmoid diverticulosis. Dependent bibasilar atelectasis. Electronically Signed   By: Rolm Baptise M.D.   On: 06/03/2017 15:37    Anti-infectives: Anti-infectives    Start     Dose/Rate Route Frequency Ordered Stop   06/04/17 1200  vancomycin (VANCOCIN) IVPB 1000 mg/200 mL premix     1,000 mg 200 mL/hr over 60 Minutes Intravenous Every 12 hours 06/04/17 1115     06/02/17 0000  piperacillin-tazobactam (ZOSYN) IVPB 3.375 g     3.375 g 12.5 mL/hr over 240 Minutes Intravenous Every 8 hours 06/01/17 2103     06/01/17 1745  piperacillin-tazobactam (ZOSYN) IVPB 3.375 g     3.375 g 100 mL/hr over 30 Minutes Intravenous  Once 06/01/17 1730 06/01/17 1839   06/01/17 1730  piperacillin-tazobactam (ZOSYN) IVPB 4.5 g  Status:  Discontinued     4.5 g 200 mL/hr over 30 Minutes  Intravenous  Once 06/01/17 1728 06/01/17 1730      Assessment/Plan: s/p * No surgery found * Continue drain  Continue vanc/zosyn Monitor closely  LOS: 4 days    TOTH III,Ashanti Littles S 06/05/2017

## 2017-06-05 NOTE — Progress Notes (Signed)
Patient ID: John [name], male   DOB: 09-05-1951, 66 y.o.   MRN: [id number]    Referring Physician(s): Dr. Jackolyn Confer  Supervising Physician: Markus Daft  Patient Status: [facility] - In-pt  Chief Complaint: RLQ abscess  Subjective: Patient sleeping but does arouse when I looked at his abdomen.  Complains of significant pain  Allergies: Patient has no known allergies.  Medications: Prior to Admission medications   Medication Sig Start Date End Date Taking? Authorizing Provider  B COMPLEX VITAMINS PO Take by mouth 3 (three) times daily.   Yes [provider]  folic acid (FOLVITE) 1 MG tablet Take 1 tablet (1 mg total) by mouth daily. 06/25/15  Yes Thurnell Lose, MD  loratadine (CLARITIN) 10 MG tablet Take 10 mg by mouth daily.   Yes [provider]  rivaroxaban (XARELTO) 20 MG TABS tablet Take 1 tablet (20 mg total) by mouth daily with supper. 09/18/16  Yes Gorsuch, Ni, MD  thiamine 100 MG tablet Take 1 tablet (100 mg total) by mouth daily. 06/25/15  Yes Thurnell Lose, MD  vitamin B-12 (CYANOCOBALAMIN) 1000 MCG tablet Take 1,000 mcg by mouth daily.   Yes [provider]    Vital Signs: BP 116/78 (BP Location: Left Arm)   Pulse 70   Temp 98.6 F (37 C) (Oral)   Resp 18   Ht 5\' 10"  (1.778 m)   Wt 158 lb 8.2 oz (71.9 kg)   SpO2 96%   BMI 22.74 kg/m   Physical Exam: Abd: soft, but very tender especially on the right side.  Has pretty significant cellulitis around the drain extending now up to his chest and down his right thigh.  He has 2 small blisters forming just medial to the drain site as well.  Drain with cloudy dark serosang output, 30cc yesterday  Imaging: Dg Chest 2 View  Result Date: 06/01/2017 CLINICAL DATA:  Altered mental status, dementia, slight drooping on the right EXAM: CHEST  2 VIEW COMPARISON:  Chest x-ray of 05/31/2017 FINDINGS: No active infiltrate or effusion is seen. Mediastinal and hilar contours are unremarkable. The  heart is within normal limits in size. No acute bony abnormality is seen. IMPRESSION: No active cardiopulmonary disease. Electronically Signed   By: Ivar Drape M.D.   On: 06/01/2017 14:42   Ct Head Wo Contrast  Result Date: 06/01/2017 CLINICAL DATA:  Increased sleeping EXAM: CT HEAD WITHOUT CONTRAST TECHNIQUE: Contiguous axial images were obtained from the base of the skull through the vertex without intravenous contrast. COMPARISON:  None. FINDINGS: Brain: There is severe global atrophy. Minimal chronic ischemic changes in the periventricular white matter are noted. No mass effect, midline shift, or acute hemorrhage. Vascular: No hyperdense vessel or unexpected calcification. Skull: Cranium is intact. Sinuses/Orbits: Mastoid air cells are clear. Visualized paranasal sinuses are clear. Other: Noncontributory IMPRESSION: No acute intracranial pathology. Electronically Signed   By: Marybelle Killings M.D.   On: 06/01/2017 17:00   Ct Abdomen Pelvis W Contrast  Result Date: 06/03/2017 CLINICAL DATA:  Drain placed yesterday EXAM: CT ABDOMEN AND PELVIS WITH CONTRAST TECHNIQUE: Multidetector CT imaging of the abdomen and pelvis was performed using the standard protocol following bolus administration of intravenous contrast. CONTRAST:  17mL ISOVUE-300 IOPAMIDOL (ISOVUE-300) INJECTION 61% COMPARISON:  06/01/2017 FINDINGS: Lower chest: Dependent atelectasis in the lower lobes. Heart is borderline in size. Scattered coronary artery calcifications. Hepatobiliary: No focal hepatic abnormality. Gallbladder unremarkable. Pancreas: No focal abnormality or ductal dilatation. Spleen: No focal abnormality.  Normal size.  Adrenals/Urinary Tract: Bilateral renal parapelvic cysts. No hydronephrosis. Adrenal glands and urinary bladder unremarkable. Stomach/Bowel: Changes of ruptured appendicitis again noted. There is sigmoid diverticulosis. No active diverticulitis. Stomach and small bowel decompressed. Vascular/Lymphatic: No evidence  of aneurysm or adenopathy. Reproductive: Mild prostate prominence. Other: Right lower quadrant fluid collection has decreased with right lower quadrant drain in place. Fluid collection measures approximately 3.6 x 2.9 cm compared to 6.1 x 5.2 cm previously. No free fluid or free air. Bilateral inguinal hernias containing fat. Musculoskeletal: No acute bony abnormality. IMPRESSION: Decreasing size of the right lower quadrant retroperitoneal abscess post drain placement. Continued inflamed adjacent appendix compatible with ruptured appendicitis. Sigmoid diverticulosis. Dependent bibasilar atelectasis. Electronically Signed   By: Rolm Baptise M.D.   On: 06/03/2017 15:37   Ct Abdomen Pelvis W Contrast  Result Date: 06/01/2017 CLINICAL DATA:  66 year old male with right abdominal and pelvic pain for several days. EXAM: CT ABDOMEN AND PELVIS WITH CONTRAST TECHNIQUE: Multidetector CT imaging of the abdomen and pelvis was performed using the standard protocol following bolus administration of intravenous contrast. CONTRAST:  172mL ISOVUE-300 IOPAMIDOL (ISOVUE-300) INJECTION 61% COMPARISON:  None. FINDINGS: Lower chest: Mild bibasilar atelectasis/ scarring noted. Coronary artery calcifications are present. Hepatobiliary: The liver and gallbladder are unremarkable. There is no evidence of biliary dilatation. Pancreas: Unremarkable Spleen: Unremarkable Adrenals/Urinary Tract: The kidneys, adrenal glands and bladder are unremarkable except for bilateral renal parapelvic cysts. Stomach/Bowel: A thickened inflamed appendix is noted with adjacent 5.2 x 6 x 7 cm complex fluid and inflammation in the lateral right lower abdomen abutting the abdominal musculature and compatible with ruptured appendicitis and ill-defined/developing abscess. There is no evidence of bowel obstruction or pneumoperitoneum. Colonic diverticulosis noted without evidence of diverticulitis. Vascular/Lymphatic: Aortic atherosclerosis. No enlarged abdominal  or pelvic lymph nodes. Reproductive: Prostate unremarkable Other: No ascites. A moderate right inguinal hernia containing fat is noted. Musculoskeletal: No acute or suspicious abnormality. IMPRESSION: Ruptured appendicitis with adjacent 5.2 x 6 x 7 cm ill-defined/developing abscess. No evidence of pneumoperitoneum or bowel obstruction. Coronary artery disease. Aortic Atherosclerosis (ICD10-I70.0). Moderate right inguinal hernia containing fat. Results were called by telephone at the time of interpretation on 06/01/2017 at 5:07 pm to [facility] , who verbally acknowledged these results. Electronically Signed   By: Margarette Canada M.D.   On: 06/01/2017 17:10   Dg Abd Portable 1v  Result Date: 06/03/2017 CLINICAL DATA:  Abdominal pain . EXAM: PORTABLE ABDOMEN - 1 VIEW COMPARISON:  CT 06/02/2017. FINDINGS: Catheter noted over the right abdomen in stable position. No bowel distention. Prior hernia repair. Degenerative changes lumbar spine with scoliosis concave left . IMPRESSION: 1. Right abdominal drainage catheter in stable position. 2. No acute abnormality.  No bowel distention. Electronically Signed   By: Marcello Moores  Register   On: 06/03/2017 10:28   Ct Image Guided Drainage By Percutaneous Catheter  Result Date: 06/03/2017 CLINICAL DATA:  Appendiceal abscess EXAM: CT GUIDED DRAINAGE OF RETROPERITONEAL ABSCESS ANESTHESIA/SEDATION: Intravenous Fentanyl and Versed were administered as conscious sedation during continuous monitoring of the patient's level of consciousness and physiological / cardiorespiratory status by the radiology RN, with a total moderate sedation time of 21 minutes. PROCEDURE: The procedure, risks, benefits, and alternatives were explained to the patient. Questions regarding the procedure were encouraged and answered. The patient understands and consents to the procedure. Select axial scans were obtained through the lower abdomen. The collection was localized and an appropriate skin entry site was  determined and marked. The operative field was prepped with chlorhexidinein a  sterile fashion, and a sterile drape was applied covering the operative field. A sterile gown and sterile gloves were used for the procedure. Local anesthesia was provided with 1% Lidocaine. Under CT fluoroscopic guidance, an 18 gauge trocar needle was advanced into the collection. Purulent material could be aspirated. An Amplatz wire advanced easily within the collection, its position confirmed on CT. Tract dilated to facilitate placement of a 10 French pigtail drain catheter. Position confirmed on CT. 30 mL of purulent aspirate were removed, sent for Gram stain and culture. Catheter secured externally with 0 Prolene suture and StatLock and placed to gravity drain bag. The patient tolerated the procedure well. COMPLICATIONS: None immediate FINDINGS: The complex right lower quadrant retroperitoneal collection was again localized. Ten French pigtail drain catheter placed without complication as above. IMPRESSION: 1. Technically successful CT-guided retroperitoneal abscess drain catheter placement Electronically Signed   By: Lucrezia Europe M.D.   On: 06/03/2017 09:30    Labs:  CBC:  Recent Labs  06/02/17 0723 06/03/17 0942 06/04/17 0506 06/05/17 1113  WBC 15.9* 19.8* 15.3* 17.2*  HGB 14.1 14.4 13.9 13.9  HCT 41.0 41.2 40.1 40.2  PLT 251 252 252 280    COAGS:  Recent Labs  06/01/17 2147  INR 1.40    BMP:  Recent Labs  06/01/17 1219 06/02/17 0723 06/03/17 0942 06/04/17 0506 06/05/17 0602  NA 138 137 137 138  --   K 4.0 3.5 3.6 3.5  --   CL 100* 105 104 104  --   CO2 27 21* 25 24  --   GLUCOSE 124* 107* 106* 111*  --   BUN 16 13 13 13   --   CALCIUM 9.3 8.3* 8.4* 8.3*  --   CREATININE 0.96 0.89 0.97 0.85 0.74  GFRNONAA >60 >60 >60 >60 >60  GFRAA >60 >60 >60 >60 >60    LIVER FUNCTION TESTS:  Recent Labs  07/24/16 1525 06/01/17 1219  BILITOT 0.49 0.9  AST 20 38  ALT 17 47  ALKPHOS 61 98  PROT  7.4 7.6  ALBUMIN 3.9 3.4*    Assessment and Plan: 1. Perforated appendicitis with RLQ abscess, s/p perc drain 2. Abdominal wall cellulitis  Drain is in position, but he certainly has cellulitis that appears to have spread today based off of lines drawn yesterday.  His WBC is up some today to 17K.  He is on vanc and zosyn which should cover intra-abdominal and skin components.  I have spoken with Dr. Marlou Starks with surgery who saw the patient today.  We will continue to watch him closely.  I spoke to Dr. Laurence Ferrari who has recommended we stop flushing his drain as it is very close to his abdominal wall.  He has edema and stranding in his subcutaneous muscle/fat, etc on his first CT scan extending up into his chest from the abscess in his abdomen.  There was nothing drainable at that time.  If this does not improve, the patient may need a repeat CT scan to evaluate for drainable fluid collection, etc.  CX: ABUNDANT GRAM POSITIVE COCCI IN PAIRS  ABUNDANT GRAM NEGATIVE COCCOBACILLI  FEW [place]   Will follow   Electronically Signed: Samhita Kretsch E 06/05/2017, 12:47 PM   I spent a total of 15 Minutes at the the patient's bedside AND on the patient's hospital floor or unit, greater than 50% of which was counseling/coordinating care for RLQ abscess

## 2017-06-06 LAB — CULTURE, BLOOD (ROUTINE X 2)
CULTURE: NO GROWTH
Culture: NO GROWTH
SPECIAL REQUESTS: ADEQUATE
SPECIAL REQUESTS: ADEQUATE

## 2017-06-06 LAB — CBC
HCT: 40 % (ref 39.0–52.0)
Hemoglobin: 14 g/dL (ref 13.0–17.0)
MCH: 32.6 pg (ref 26.0–34.0)
MCHC: 35 g/dL (ref 30.0–36.0)
MCV: 93.2 fL (ref 78.0–100.0)
PLATELETS: 313 10*3/uL (ref 150–400)
RBC: 4.29 MIL/uL (ref 4.22–5.81)
RDW: 13.7 % (ref 11.5–15.5)
WBC: 12.1 10*3/uL — ABNORMAL HIGH (ref 4.0–10.5)

## 2017-06-06 LAB — VANCOMYCIN, TROUGH: VANCOMYCIN TR: 12 ug/mL — AB (ref 15–20)

## 2017-06-06 LAB — CREATININE, SERUM: CREATININE: 0.91 mg/dL (ref 0.61–1.24)

## 2017-06-06 NOTE — Progress Notes (Signed)
Pharmacy Antibiotic Note  John [name] is a 66 y.o. male admitted on 06/01/2017 with intra-abdominal abscess s/p drain placement 6/27 + Zosyn treatment. Today, surgery notes patient has developed cellulitis around drain. Pharmacy has been consulted for vancomcyin dosing.  Today: Vanc Trough within goal range 10-15mg /l. SCr is stable. By report, cellulitis is improved.  Plan: Continue vancomycin 1g IV q12h. Continue Zosyn 3.375g IV q8h (4 hour infusion time).  Daily SCr  Height: 5\' 10"  (177.8 cm) Weight: 158 lb 8.2 oz (71.9 kg) IBW/kg (Calculated) : 73  Temp (24hrs), Avg:98.4 F (36.9 C), Min:98.1 F (36.7 C), Max:98.7 F (37.1 C)   Recent Labs Lab 06/01/17 1358 06/01/17 1658 06/02/17 0723 06/03/17 0942 06/04/17 0506 06/05/17 0602 06/05/17 1113 06/06/17 0550 06/06/17 0740 06/06/17 1223  WBC  --   --  15.9* 19.8* 15.3*  --  17.2*  --  12.1*  --   CREATININE  --   --  0.89 0.97 0.85 0.74  --  0.91  --   --   LATICACIDVEN 1.4 0.9  --   --   --   --   --   --   --   --   VANCOTROUGH  --   --   --   --   --   --   --   --   --  12*    Estimated Creatinine Clearance: 81.2 mL/min (by C-G formula based on SCr of 0.91 mg/dL).    No Known Allergies  Antimicrobials this admission: 6/26 Zosyn >>  6/29 Vanc >>   Dose adjustments this admission:  --    Microbiology results: 6/26 BCx: ngtd 6/27 abscess cx: mult spp. None predominant. Holding for anaerobe.  6/27 MRSA PCR: neg  Thank you for allowing pharmacy to be a part of this patient's care.  Romeo Rabon, PharmD, pager [phone]. 06/06/2017,1:45 PM.

## 2017-06-06 NOTE — Progress Notes (Signed)
PROGRESS NOTE    John [name]  WUJ:[id number] DOB: October 19, 1951 DOA: 06/01/2017 PCP: Cari Caraway, MD   Brief Narrative: John [name] is a 66 y.o. 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 Patient presented with abdominal pain, decrease oral intake, progressive weakness. He is found to have appendicitis with perforation and abscess. General surgery consulted for evaluation. Recommending IR percutaneous drain.   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 S/p percutaneous drain on 06/02/17. Afebrile overnight with continued improvement in WBC. Cultures still pending. -General surgery/IR recommendations -Continue Zosyn -Continue IV fluids -CBC pending  Appendicitis w/ rupture/abscess Abdominal pain Stable -continue drain per IR -cultures pending  Cellulitis Over drain site. Improved today -Continue zosyn -continue vancomycin  Somnolence Resolved. Likely secondary to dilaudid  Dementia Patient is currently not agitated/violent -Continue Ativan when necessary  History of PE Patient on Xarelto as an outpatient. This was held on admission. Started on Lovenox VTE prophylaxis dose on admission. S/p perc drain. -continue Xarelto   DVT prophylaxis: Xarelto Code Status: Full code Family Communication: Wife at bedside Disposition Plan: Discharge pending management of appendicitis, PT recommendations   Consultants:   General surgery  Interventional radiology  Procedures:   Percutaneous drain (06/02/17)  Antimicrobials:  Zosyn  Vancomycin   Subjective: Afebrile.  Objective: Vitals:   06/05/17 0537 06/05/17  1259 06/05/17 2227 06/06/17 0509  BP: 116/78 107/66 104/67 106/74  Pulse: 70 71 80 66  Resp: 18 18 18 18   Temp: 98.6 F (37 C) 98.1 F (36.7 C) 98.7 F (37.1 C) 98.1 F (36.7 C)  TempSrc: Oral Oral Oral Oral  SpO2: 96% 98% 95% 96%  Weight:      Height:        Intake/Output Summary (Last 24 hours) at 06/06/17 1156 Last data filed at 06/06/17 0511  Gross per 24 hour  Intake          4046.67 ml  Output             1310 ml  Net          2736.67 ml   Filed Weights   06/01/17 2048 06/02/17 0948  Weight: 75.3 kg (166 lb) 71.9 kg (158 lb 8.2 oz)    Examination:  General exam: No distress Respiratory system: Clear to auscultation bilaterally. Unlabored work of breathing. No wheezing or rales. Cardiovascular system: Regular rate and rhythm. Normal S1 and S2. No heart murmurs present. No extra heart sounds Gastrointestinal system: Non-distended, soft, non-tender. Central nervous system: Alert and not oriented x3.  Extremities: No edema. No calf tenderness Skin: No cyanosis. Erythematous rash improved on right lower abdomen. Psychiatry: Judgement and insight impaired. Flat affect.     Data Reviewed: I have personally reviewed following labs and imaging studies  CBC:  Recent Labs Lab 06/01/17 1219 06/02/17 0723 06/03/17 0942 06/04/17 0506 06/05/17 1113 06/06/17 0740  WBC 18.2* 15.9* 19.8* 15.3* 17.2* 12.1*  NEUTROABS 13.9*  --  16.6*  --   --   --   HGB 16.2 14.1 14.4 13.9 13.9 14.0  HCT 47.0 41.0 41.2 40.1 40.2 40.0  MCV 95.9 94.0 92.8 92.4 93.9 93.2  PLT 276 251 252 252 280 628   Basic Metabolic Panel:  Recent Labs Lab 05/31/17 1813 06/01/17 1219 06/02/17 0723 06/03/17 0942 06/04/17 0506 06/05/17 0602 06/06/17 0550  NA 135 138 137 137 138  --   --   K 3.9 4.0 3.5 3.6 3.5  --   --   CL 100* 100* 105 104 104  --   --   CO2 24 27 21* 25 24  --   --   GLUCOSE 160* 124* 107* 106* 111*  --   --   BUN 11 16 13 13 13   --   --   CREATININE 0.99 0.96 0.89 0.97  0.85 0.74 0.91  CALCIUM 9.4 9.3 8.3* 8.4* 8.3*  --   --    GFR: Estimated Creatinine Clearance: 81.2 mL/min (by C-G formula based on SCr of 0.91 mg/dL). Liver Function Tests:  Recent Labs Lab 06/01/17 1219  AST 38  ALT 47  ALKPHOS 98  BILITOT 0.9  PROT 7.6  ALBUMIN 3.4*   No results for input(s): LIPASE, AMYLASE in the last 168 hours. No results for input(s): AMMONIA in the last 168 hours. Coagulation Profile:  Recent Labs Lab 06/01/17 2147  INR 1.40   Cardiac Enzymes:  Recent Labs Lab 06/01/17 1354  TROPONINI <0.03   BNP (last 3 results) No results for input(s): PROBNP in the last 8760 hours. HbA1C: No results for input(s): HGBA1C in the last 72 hours. CBG:  Recent Labs Lab 06/01/17 1221  GLUCAP 113*   Lipid Profile: No results for input(s): CHOL, HDL, LDLCALC, TRIG, CHOLHDL, LDLDIRECT in the last 72 hours. Thyroid Function Tests: No results for input(s): TSH, T4TOTAL, FREET4, T3FREE, THYROIDAB in the last 72 hours. Anemia Panel: No results for input(s): VITAMINB12, FOLATE, FERRITIN, TIBC, IRON, RETICCTPCT in the last 72 hours. Sepsis Labs:  Recent Labs Lab 06/01/17 1358 06/01/17 1658 06/01/17 2147  PROCALCITON  --   --  0.42  LATICACIDVEN 1.4 0.9  --     Recent Results (from the past 240 hour(s))  Culture, blood (routine x 2)     Status: None (Preliminary result)   Collection Time: 06/01/17  3:19 PM  Result Value Ref Range Status   Specimen Description BLOOD RIGHT ANTECUBITAL  Final   Special Requests   Final    BOTTLES DRAWN AEROBIC AND ANAEROBIC Blood Culture adequate volume   Culture   Final    NO GROWTH 4 DAYS Performed at [facility], [place] [address], [place], [place] [address]    Report Status PENDING  Incomplete  Culture, blood (routine x 2)     Status: None (Preliminary result)   Collection Time: 06/01/17  4:04 PM  Result Value Ref Range Status   Specimen Description BLOOD LEFT FOREARM  Final   Special Requests   Final     BOTTLES DRAWN AEROBIC AND ANAEROBIC Blood Culture adequate volume   Culture   Final    NO GROWTH 4 DAYS Performed at [facility], [place] [address], [place], [place] [address]    Report Status PENDING  Incomplete  Surgical PCR screen     Status: None   Collection Time: 06/02/17  4:04 PM  Result Value Ref Range Status   MRSA, PCR NEGATIVE NEGATIVE Final   Staphylococcus aureus NEGATIVE NEGATIVE Final    Comment:        The Xpert SA Assay (FDA approved for NASAL specimens in patients over 30 years of age), is one component of  a comprehensive surveillance program.  Test performance has been validated by [facility] for patients greater than or equal to 31 year old. It is not intended to diagnose infection nor to guide or monitor treatment.   Aerobic/Anaerobic Culture (surgical/deep wound)     Status: Abnormal (Preliminary result)   Collection Time: 06/02/17  5:10 PM  Result Value Ref Range Status   Specimen Description ABSCESS RLQ CT DRAIN  Final   Special Requests NONE  Final   Gram Stain   Final    MODERATE WBC PRESENT, PREDOMINANTLY PMN ABUNDANT GRAM POSITIVE COCCI IN PAIRS ABUNDANT GRAM NEGATIVE COCCOBACILLI FEW GRAM POSITIVE RODS    Culture (A)  Final    MULTIPLE ORGANISMS PRESENT, NONE PREDOMINANT HOLDING FOR POSSIBLE ANAEROBE Performed at [facility], [place] [address], [place], [place] [address]    Report Status PENDING  Incomplete         Radiology Studies: No results found.      Scheduled Meds: . feeding supplement  1 Container Oral TID BM  . folic acid  1 mg Intravenous Daily  . magnesium hydroxide  30 mL Oral Daily  . rivaroxaban  20 mg Oral Q supper  . thiamine  100 mg Intravenous Daily   Continuous Infusions: . sodium chloride 100 mL/hr at 06/06/17 0935  . famotidine (PEPCID) IV Stopped (06/06/17 0956)  . piperacillin-tazobactam (ZOSYN)  IV 3.375 g (06/06/17 0756)  . vancomycin Stopped (06/06/17 0021)     LOS: 5 days     Cordelia Poche, MD [facility] 06/06/2017, 11:56 AM Pager: [phone]  If 7PM-7AM, please contact night-coverage [email or web address] Password [email or web address] 06/06/2017, 11:56 AM

## 2017-06-06 NOTE — Progress Notes (Signed)
   Subjective/Chief Complaint: Seems to be a little better today   Objective: Vital signs in last 24 hours: Temp:  [98.1 F (36.7 C)-98.7 F (37.1 C)] 98.1 F (36.7 C) (07/01 0509) Pulse Rate:  [66-80] 66 (07/01 0509) Resp:  [18] 18 (07/01 0509) BP: (104-107)/(66-74) 106/74 (07/01 0509) SpO2:  [95 %-98 %] 96 % (07/01 0509) Last BM Date: 06/04/17  Intake/Output from previous day: 06/30 0701 - 07/01 0700 In: 4046.7 [P.O.:480; I.V.:2666.7; IV Piggyback:900] Out: 1310 [Urine:1300; Drains:10] Intake/Output this shift: No intake/output data recorded.  General appearance: alert and slowed mentation Resp: clear to auscultation bilaterally Cardio: regular rate and rhythm GI: soft, less tender today. cellulitis of right flank seems to be less  Lab Results:   Recent Labs  06/05/17 1113 06/06/17 0740  WBC 17.2* 12.1*  HGB 13.9 14.0  HCT 40.2 40.0  PLT 280 313   BMET  Recent Labs  06/03/17 0942 06/04/17 0506 06/05/17 0602 06/06/17 0550  NA 137 138  --   --   K 3.6 3.5  --   --   CL 104 104  --   --   CO2 25 24  --   --   GLUCOSE 106* 111*  --   --   BUN 13 13  --   --   CREATININE 0.97 0.85 0.74 0.91  CALCIUM 8.4* 8.3*  --   --    PT/INR No results for input([name]): LABPROT, INR in the last 72 hours. ABG No results for input([name]): PHART, HCO3 in the last 72 hours.  Invalid input([name]): PCO2, PO2  Studies/Results: No results found.  Anti-infectives: Anti-infectives    Start     Dose/Rate Route Frequency Ordered Stop   06/04/17 1200  vancomycin (VANCOCIN) IVPB 1000 mg/200 mL premix     1,000 mg 200 mL/hr over 60 Minutes Intravenous Every 12 hours 06/04/17 1115     06/02/17 0000  piperacillin-tazobactam (ZOSYN) IVPB 3.375 g     3.375 g 12.5 mL/hr over 240 Minutes Intravenous Every 8 hours 06/01/17 2103     06/01/17 1745  piperacillin-tazobactam (ZOSYN) IVPB 3.375 g     3.375 g 100 mL/hr over 30 Minutes Intravenous  Once 06/01/17 1730 06/01/17 1839   06/01/17  1730  piperacillin-tazobactam (ZOSYN) IVPB 4.5 g  Status:  Discontinued     4.5 g 200 mL/hr over 30 Minutes Intravenous  Once 06/01/17 1728 06/01/17 1730      Assessment/Plan: [name]/p * No surgery found * Advance diet  Continue abx and drain Continue to monitor but seems to be making slow improvement  LOS: 5 days    John [name],John [name] 06/06/2017

## 2017-06-07 ENCOUNTER — Inpatient Hospital Stay (HOSPITAL_COMMUNITY): Payer: Medicare Other

## 2017-06-07 DIAGNOSIS — M542 Cervicalgia: Secondary | ICD-10-CM

## 2017-06-07 LAB — CBC
HCT: 41.4 % (ref 39.0–52.0)
HEMOGLOBIN: 14.3 g/dL (ref 13.0–17.0)
MCH: 32.4 pg (ref 26.0–34.0)
MCHC: 34.5 g/dL (ref 30.0–36.0)
MCV: 93.7 fL (ref 78.0–100.0)
Platelets: 365 10*3/uL (ref 150–400)
RBC: 4.42 MIL/uL (ref 4.22–5.81)
RDW: 13.5 % (ref 11.5–15.5)
WBC: 13.3 10*3/uL — AB (ref 4.0–10.5)

## 2017-06-07 LAB — AEROBIC/ANAEROBIC CULTURE W GRAM STAIN (SURGICAL/DEEP WOUND)

## 2017-06-07 LAB — BASIC METABOLIC PANEL
ANION GAP: 9 (ref 5–15)
BUN: 6 mg/dL (ref 6–20)
CHLORIDE: 107 mmol/L (ref 101–111)
CO2: 24 mmol/L (ref 22–32)
CREATININE: 0.88 mg/dL (ref 0.61–1.24)
Calcium: 8.4 mg/dL — ABNORMAL LOW (ref 8.9–10.3)
GFR calc non Af Amer: >60 mL/min (ref >60)
Glucose, Bld: 115 mg/dL — ABNORMAL HIGH (ref 65–99)
POTASSIUM: 3.4 mmol/L — AB (ref 3.5–5.1)
SODIUM: 140 mmol/L (ref 135–145)

## 2017-06-07 LAB — CREATININE, SERUM
Creatinine, Ser: 0.87 mg/dL (ref 0.61–1.24)
GFR calc Af Amer: >60 mL/min (ref >60)
GFR calc non Af Amer: >60 mL/min (ref >60)

## 2017-06-07 NOTE — Progress Notes (Signed)
Physical Therapy Treatment Patient Details Name: John [name] MRN: [id number] DOB: 1951-04-16 Today's Date: 06/07/2017    History of Present Illness John [name] is a 66 y.o. 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  Probable ruptured appendix with abcess. Post drain placed.                   PT Comments    Spouse Coralyn Mark present during session and very helpful.  Pt remains confused and unable to follow commands.  Responds to wife 50/50 but words are broken/random.  Required + 2 total Assist to get OOB to [facility] then to recliner.  Severe R lean and poor correction.  Pt unable to weight shift, unable to complete pivot and unable to advance either LE to functionally amb.  Rigid and resistant.  Pt unable to hold a cup of water.  Pt may need SNF before returning home.   Follow Up Recommendations  Home health PT;SNF;Supervision/Assistance - 24 hour     Equipment Recommendations  None recommended by PT    Recommendations for Other Services       Precautions / Restrictions Precautions Precautions: Fall Precaution Comments: easily agitated, right drain from abdomen, does better with family present Restrictions Weight Bearing Restrictions: No    Mobility  Bed Mobility Overal bed mobility: Needs Assistance Bed Mobility: Supine to Sit     Supine to sit: +2 for physical assistance;+2 for safety/equipment;HOB elevated;Total assist     General bed mobility comments: much encouragement and + 2 assist to transfer from supine to EOB.  Resistant.  Pushing.  Unable to follow simple commands. Required 5 min sitting EOB before pt was able to support self.    Transfers Overall transfer level: Needs assistance Equipment  used: 2 person hand held assist Transfers: Sit to/from [facility] Sit to Stand: +2 physical assistance;+2 safety/equipment;Total assist Stand pivot transfers: Total assist;+2 physical assistance;+2 safety/equipment       General transfer comment: + 2 side by side assist with much encouragement.  Assisted from bed to [facility] then from [facility] to recliner.  Pt still resistant and unable to follow commands.  Rigid.   Ambulation/Gait             General Gait Details: unablr to attempt due to poor transfer ability   Stairs            Wheelchair Mobility    Modified Rankin (Stroke Patients Only)       Balance                                            Cognition Arousal/Alertness: Awake/alert Behavior During Therapy: Agitated;Anxious (resistant) Overall Cognitive Status: Impaired/Different from baseline Area of Impairment: Following commands;Safety/judgement;Orientation;Attention;Memory;Problem solving;Awareness                               General Comments: unable to follow verbal commands.  Poor tracking.  Mixed words.  50/50 responded to wife      Exercises      General Comments  Pertinent Vitals/Pain Pain Assessment: No/denies pain    Home Living                      Prior Function            PT Goals (current goals can now be found in the care plan section) Progress towards PT goals: Progressing toward goals    Frequency    Min 3X/week      PT Plan Current plan remains appropriate    Co-evaluation              AM-PAC PT "6 Clicks" Daily Activity  Outcome Measure  Difficulty turning over in bed (including adjusting bedclothes, sheets and blankets)?: Total Difficulty moving from lying on back to sitting on the side of the bed? : Total Difficulty sitting down on and standing up from a chair with arms (e.g., wheelchair, bedside commode, etc,.)?: Total Help needed moving to and  from a bed to chair (including a wheelchair)?: Total Help needed walking in hospital room?: Total Help needed climbing 3-5 steps with a railing? : Total 6 Click Score: 6    End of Session Equipment Utilized During Treatment: Gait belt Activity Tolerance: Other (comment) (AMS) Patient left: in chair;with chair alarm set;with family/visitor present;with call bell/phone within reach Nurse Communication: Mobility status PT Visit Diagnosis: Unsteadiness on feet (R26.81)     Time: 6389-3734 PT Time Calculation (min) (ACUTE ONLY): 30 min  Charges:  $Therapeutic Activity: 23-37 mins                    G Codes:       {Annielee Jemmott  PTA [facility]  Acute  Rehab Pager      [phone]

## 2017-06-07 NOTE — Progress Notes (Signed)
Patient ID: John [name], male   DOB: 07-26-1951, 66 y.o.   MRN: [id number]  [facility] Surgery Progress Note     Subjective: CC- perforated appendicitis s/p perc drain Patient resting comfortably in bed. Confused but denies any abdominal pain. Tolerating small amounts of regular diet. No n/v. BM yesterday. He is afebrile. Drain output has significantly decreased.   Objective: Vital signs in last 24 hours: Temp:  [97.5 F (36.4 C)-98.2 F (36.8 C)] 97.5 F (36.4 C) (07/02 0542) Pulse Rate:  [67-78] 78 (07/02 0542) Resp:  [16-18] 16 (07/02 0542) BP: (97-125)/(65-84) 125/84 (07/02 0542) SpO2:  [96 %] 96 % (07/02 0542) Last BM Date: 06/04/17  Intake/Output from previous day: 07/01 0701 - 07/02 0700 In: 3770 [P.O.:420; I.V.:2700; IV Piggyback:650] Out: 2725 [Urine:2725] Intake/Output this shift: No intake/output data recorded.  PE: Gen:  Alert, NAD, cooperative HEENT: EOM's intact, pupils equal  Card:  RRR, no M/G/R heard Pulm:  CTAB, no W/R/R, effort normal Abd: Soft, ND, mild lower abdominal tenderness, +BS, drain with trace serosanguinous fluid with some sediment in bag, surrounding erythema seems to be decreasing as it is less than the pen markings in area Ext:  No erythema, edema, or tenderness BUE/BLE  Psych: oriented to self  Lab Results:   Recent Labs  06/05/17 1113 06/06/17 0740  WBC 17.2* 12.1*  HGB 13.9 14.0  HCT 40.2 40.0  PLT 280 313   BMET  Recent Labs  06/06/17 0550 06/07/17 0458  CREATININE 0.91 0.87   PT/INR No results for input(s): LABPROT, INR in the last 72 hours. CMP     Component Value Date/Time   NA 138 06/04/2017 0506   NA 141 07/24/2016 1525   K 3.5 06/04/2017 0506   K 4.4 07/24/2016 1525   CL 104 06/04/2017 0506   CO2 24 06/04/2017 0506   CO2 27 07/24/2016 1525   GLUCOSE 111 (H) 06/04/2017 0506   GLUCOSE 95 07/24/2016 1525   BUN 13 06/04/2017 0506   BUN 14.8 07/24/2016 1525   CREATININE 0.87 06/07/2017 0458   CREATININE 0.9 07/24/2016 1525   CALCIUM 8.3 (L) 06/04/2017 0506   CALCIUM 10.0 07/24/2016 1525   PROT 7.6 06/01/2017 1219   PROT 7.4 07/24/2016 1525   ALBUMIN 3.4 (L) 06/01/2017 1219   ALBUMIN 3.9 07/24/2016 1525   AST 38 06/01/2017 1219   AST 20 07/24/2016 1525   ALT 47 06/01/2017 1219   ALT 17 07/24/2016 1525   ALKPHOS 98 06/01/2017 1219   ALKPHOS 61 07/24/2016 1525   BILITOT 0.9 06/01/2017 1219   BILITOT 0.49 07/24/2016 1525   GFRNONAA >60 06/07/2017 0458   GFRAA >60 06/07/2017 0458   Lipase  No results found for: LIPASE     Studies/Results: No results found.  Anti-infectives: Anti-infectives    Start     Dose/Rate Route Frequency Ordered Stop   06/04/17 1200  vancomycin (VANCOCIN) IVPB 1000 mg/200 mL premix     1,000 mg 200 mL/hr over 60 Minutes Intravenous Every 12 hours 06/04/17 1115     06/02/17 0000  piperacillin-tazobactam (ZOSYN) IVPB 3.375 g     3.375 g 12.5 mL/hr over 240 Minutes Intravenous Every 8 hours 06/01/17 2103     06/01/17 1745  piperacillin-tazobactam (ZOSYN) IVPB 3.375 g     3.375 g 100 mL/hr over 30 Minutes Intravenous  Once 06/01/17 1730 06/01/17 1839   06/01/17 1730  piperacillin-tazobactam (ZOSYN) IVPB 4.5 g  Status:  Discontinued     4.5 g 200 mL/hr  over 30 Minutes Intravenous  Once 06/01/17 1728 06/01/17 1730       Assessment/Plan Dementia, h/o TIB 1998 H/o PE - on Xarelto at home Polycythemia vera  Ruptured appendicitis with abscess s/p per drain 6/27 - multiple organisms present on culture - developed cellulitis around drain site  - drain with 0cc output in 24hr - CBC pending, patient is afebrile  ID - zosyn 6/26>>day#7, vancomycin 6/29>>day#4 FEN - regular diet VTE - Xarelto  Plan - cellulitis seems to be improving. Continue drain and abx. CBC pending. Will discuss with MD when to repeat CT scan.   LOS: 6 days    Jerrye Beavers , [facility] Surgery 06/07/2017, 8:05 AM Pager: [phone] Consults:  [phone] Mon-Fri 7:00 am-4:30 pm Sat-Sun 7:00 am-11:30 am

## 2017-06-07 NOTE — Progress Notes (Signed)
Referring Physician(s): Rosenbower,T  Supervising Physician: Aletta Edouard  Patient Status:  [facility] - In-pt  Chief Complaint:  Appendiceal abscess  Subjective: Patient just back from unsuccessful attempt at CT neck secondary to agitation; still has some tenderness/cellulitis at right abdominal drain site but appears slightly improved.   Allergies: Patient has no known allergies.  Medications: Prior to Admission medications   Medication Sig Start Date End Date Taking? Authorizing Provider  B COMPLEX VITAMINS PO Take by mouth 3 (three) times daily.   Yes [provider]  folic acid (FOLVITE) 1 MG tablet Take 1 tablet (1 mg total) by mouth daily. 06/25/15  Yes Thurnell Lose, MD  loratadine (CLARITIN) 10 MG tablet Take 10 mg by mouth daily.   Yes [provider]  rivaroxaban (XARELTO) 20 MG TABS tablet Take 1 tablet (20 mg total) by mouth daily with supper. 09/18/16  Yes Gorsuch, Ni, MD  thiamine 100 MG tablet Take 1 tablet (100 mg total) by mouth daily. 06/25/15  Yes Thurnell Lose, MD  vitamin B-12 (CYANOCOBALAMIN) 1000 MCG tablet Take 1,000 mcg by mouth daily.   Yes [provider]     Vital Signs: BP 120/78 (BP Location: Left Arm)   Pulse 67   Temp 97.7 F (36.5 C) (Axillary)   Resp 16   Ht 5\' 10"  (1.778 m)   Wt 158 lb 8.2 oz (71.9 kg)   SpO2 96%   BMI 22.74 kg/m   Physical Exam right abdominal drain intact, output minimal, erythema/cellulitis extending from right lateral lower chest region to below drain site ; drain fluid cultures with multiple organisms  Imaging: No results found.  Labs:  CBC:  Recent Labs  06/04/17 0506 06/05/17 1113 06/06/17 0740 06/07/17 0742  WBC 15.3* 17.2* 12.1* 13.3*  HGB 13.9 13.9 14.0 14.3  HCT 40.1 40.2 40.0 41.4  PLT 252 280 313 365    COAGS:  Recent Labs  06/01/17 2147  INR 1.40    BMP:  Recent Labs  06/02/17 0723 06/03/17 0942 06/04/17 0506 06/05/17 0602 06/06/17 0550  06/07/17 0458 06/07/17 0742  NA 137 137 138  --   --   --  140  K 3.5 3.6 3.5  --   --   --  3.4*  CL 105 104 104  --   --   --  107  CO2 21* 25 24  --   --   --  24  GLUCOSE 107* 106* 111*  --   --   --  115*  BUN 13 13 13   --   --   --  6  CALCIUM 8.3* 8.4* 8.3*  --   --   --  8.4*  CREATININE 0.89 0.97 0.85 0.74 0.91 0.87 0.88  GFRNONAA >60 >60 >60 >60 >60 >60 >60  GFRAA >60 >60 >60 >60 >60 >60 >60    LIVER FUNCTION TESTS:  Recent Labs  07/24/16 1525 06/01/17 1219  BILITOT 0.49 0.9  AST 20 38  ALT 17 47  ALKPHOS 61 98  PROT 7.4 7.6  ALBUMIN 3.9 3.4*    Assessment and Plan: Appendiceal abscess, status post drainage on 06/02/17; AF; WBC 13.3 (12.1), creat nl; drain output diminishing but flushes were d/c'd due to skin cellulitis; check f/u CT tomorrow and include CT neck at same time ; pt will need sedative before scan   Electronically Signed: D. Rowe Robert, PA-C 06/07/2017, 4:24 PM   I spent a total of 20 minutes at  the the patient's bedside AND on the patient's hospital floor or unit, greater than 50% of which was counseling/coordinating care for abdominal abscess drain    Patient ID: John [name], male   DOB: [birth year], 66 y.o.   MRN: [id number]

## 2017-06-07 NOTE — Progress Notes (Signed)
PROGRESS NOTE    John [name]  NWG:[id number] DOB: [birth year] DOA: 06/01/2017 PCP: Cari Caraway, MD   Brief Narrative: John [name] is a 66 y.o. 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 Patient presented with abdominal pain, decrease oral intake, progressive weakness. He is found to have appendicitis with perforation and abscess. General surgery consulted for evaluation. Recommending IR percutaneous drain.   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 S/p percutaneous drain on 06/02/17. Afebrile overnight. WBC stable. Culture is significant for multiple organisms. -General surgery/IR recommendations -Continue Zosyn -Continue IV fluids  Appendicitis w/ rupture/abscess Abdominal pain Stable -continue drain per IR -cultures pending  Cellulitis Over drain site. Improved. -Continue zosyn -continue vancomycin  Somnolence Resolved. Likely secondary to dilaudid  Dementia Patient is currently not agitated/violent -Continue Ativan when necessary  History of PE Patient on Xarelto as an outpatient. This was held on admission. Started on Lovenox VTE prophylaxis dose on admission. S/p perc drain. -continue Xarelto  Neck pain Unknown cause -CT neck   DVT prophylaxis: Xarelto Code Status: Full code Family Communication: Wife at bedside Disposition Plan: Discharge pending management of appendicitis, PT recommendations   Consultants:   General surgery  Interventional radiology  Procedures:   None  Antimicrobials:  Zosyn   Subjective: Afebrile. Neck pain.  Objective: Vitals:   06/06/17 0509 06/06/17 1459  06/06/17 2038 06/07/17 0542  BP: 106/74 97/65 115/74 125/84  Pulse: 66 71 67 78  Resp: 18 18 16 16   Temp: 98.1 F (36.7 C) 98.2 F (36.8 C) 97.9 F (36.6 C) 97.5 F (36.4 C)  TempSrc: Oral Axillary Axillary Axillary  SpO2: 96% 96% 96% 96%  Weight:      Height:        Intake/Output Summary (Last 24 hours) at 06/07/17 1021 Last data filed at 06/07/17 0600  Gross per 24 hour  Intake             3650 ml  Output             2725 ml  Net              925 ml   Filed Weights   06/01/17 2048 06/02/17 0948  Weight: 75.3 kg (166 lb) 71.9 kg (158 lb 8.2 oz)    Examination:  General exam: No distress HEENT: neck pain when attempting to turn to the left Respiratory system: Clear to auscultation bilaterally. Unlabored work of breathing. No wheezing or rales. Cardiovascular system: Regular rate and rhythm. Normal S1 and S2. No heart murmurs present. No extra heart sounds Gastrointestinal system: Non-distended, soft, tenderness diffusely. Central nervous system: Alert, not oriented.  Extremities: No edema or leg tenderness Skin: No cyanosis. Erythematous rash over drain is improved. Psychiatry: Judgement and insight impaired. Flat affect.     Data Reviewed: I have personally reviewed following labs and imaging studies  CBC:  Recent Labs Lab 06/01/17 1219  06/03/17 0942 06/04/17 0506 06/05/17 1113 06/06/17 0740 06/07/17 0742  WBC 18.2*  < > 19.8* 15.3* 17.2* 12.1* 13.3*  NEUTROABS 13.9*  --  16.6*  --   --   --   --  HGB 16.2  < > 14.4 13.9 13.9 14.0 14.3  HCT 47.0  < > 41.2 40.1 40.2 40.0 41.4  MCV 95.9  < > 92.8 92.4 93.9 93.2 93.7  PLT 276  < > 252 252 280 313 365  < > = values in this interval not displayed. Basic Metabolic Panel:  Recent Labs Lab 06/01/17 1219 06/02/17 0723 06/03/17 6294 06/04/17 0506 06/05/17 0602 06/06/17 0550 06/07/17 0458 06/07/17 0742  NA 138 137 137 138  --   --   --  140  K 4.0 3.5 3.6 3.5  --   --   --  3.4*  CL 100* 105 104 104   --   --   --  107  CO2 27 21* 25 24  --   --   --  24  GLUCOSE 124* 107* 106* 111*  --   --   --  115*  BUN 16 13 13 13   --   --   --  6  CREATININE 0.96 0.89 0.97 0.85 0.74 0.91 0.87 0.88  CALCIUM 9.3 8.3* 8.4* 8.3*  --   --   --  8.4*   GFR: Estimated Creatinine Clearance: 84 mL/min (by C-G formula based on SCr of 0.88 mg/dL). Liver Function Tests:  Recent Labs Lab 06/01/17 1219  AST 38  ALT 47  ALKPHOS 98  BILITOT 0.9  PROT 7.6  ALBUMIN 3.4*   No results for input(s): LIPASE, AMYLASE in the last 168 hours. No results for input(s): AMMONIA in the last 168 hours. Coagulation Profile:  Recent Labs Lab 06/01/17 2147  INR 1.40   Cardiac Enzymes:  Recent Labs Lab 06/01/17 1354  TROPONINI <0.03   BNP (last 3 results) No results for input(s): PROBNP in the last 8760 hours. HbA1C: No results for input(s): HGBA1C in the last 72 hours. CBG:  Recent Labs Lab 06/01/17 1221  GLUCAP 113*   Lipid Profile: No results for input(s): CHOL, HDL, LDLCALC, TRIG, CHOLHDL, LDLDIRECT in the last 72 hours. Thyroid Function Tests: No results for input(s): TSH, T4TOTAL, FREET4, T3FREE, THYROIDAB in the last 72 hours. Anemia Panel: No results for input(s): VITAMINB12, FOLATE, FERRITIN, TIBC, IRON, RETICCTPCT in the last 72 hours. Sepsis Labs:  Recent Labs Lab 06/01/17 1358 06/01/17 1658 06/01/17 2147  PROCALCITON  --   --  0.42  LATICACIDVEN 1.4 0.9  --     Recent Results (from the past 240 hour(s))  Culture, blood (routine x 2)     Status: None   Collection Time: 06/01/17  3:19 PM  Result Value Ref Range Status   Specimen Description BLOOD RIGHT ANTECUBITAL  Final   Special Requests   Final    BOTTLES DRAWN AEROBIC AND ANAEROBIC Blood Culture adequate volume   Culture   Final    NO GROWTH 5 DAYS Performed at [facility], [place] [address], [place], [place] [address]    Report Status 06/06/2017 FINAL  Final  Culture, blood (routine x 2)     Status: None    Collection Time: 06/01/17  4:04 PM  Result Value Ref Range Status   Specimen Description BLOOD LEFT FOREARM  Final   Special Requests   Final    BOTTLES DRAWN AEROBIC AND ANAEROBIC Blood Culture adequate volume   Culture   Final    NO GROWTH 5 DAYS Performed at [facility], [place] [address], [place], [place] [address]    Report Status 06/06/2017 FINAL  Final  Surgical PCR  screen     Status: None   Collection Time: 06/02/17  4:04 PM  Result Value Ref Range Status   MRSA, PCR NEGATIVE NEGATIVE Final   Staphylococcus aureus NEGATIVE NEGATIVE Final    Comment:        The Xpert SA Assay (FDA approved for NASAL specimens in patients over 36 years of age), is one component of a comprehensive surveillance program.  Test performance has been validated by [facility] for patients greater than or equal to 73 year old. It is not intended to diagnose infection nor to guide or monitor treatment.   Aerobic/Anaerobic Culture (surgical/deep wound)     Status: Abnormal (Preliminary result)   Collection Time: 06/02/17  5:10 PM  Result Value Ref Range Status   Specimen Description ABSCESS RLQ CT DRAIN  Final   Special Requests NONE  Final   Gram Stain   Final    MODERATE WBC PRESENT, PREDOMINANTLY PMN ABUNDANT GRAM POSITIVE COCCI IN PAIRS ABUNDANT GRAM NEGATIVE COCCOBACILLI FEW GRAM POSITIVE RODS    Culture (A)  Final    MULTIPLE ORGANISMS PRESENT, NONE PREDOMINANT HOLDING FOR POSSIBLE ANAEROBE Performed at [facility], [place] [address] [place], [place] [address]    Report Status PENDING  Incomplete         Radiology Studies: No results found.      Scheduled Meds: . feeding supplement  1 Container Oral TID BM  . folic acid  1 mg Intravenous Daily  . magnesium hydroxide  30 mL Oral Daily  . rivaroxaban  20 mg Oral Q supper  . thiamine  100 mg Intravenous Daily   Continuous Infusions: . sodium chloride 100 mL/hr at 06/06/17 2357  . famotidine (PEPCID) IV  Stopped (06/06/17 2050)  . piperacillin-tazobactam (ZOSYN)  IV Stopped (06/07/17 0353)  . vancomycin Stopped (06/07/17 0053)     LOS: 6 days     Cordelia Poche, MD [facility] 06/07/2017, 10:21 AM Pager: [phone]  If 7PM-7AM, please contact night-coverage [email or web address] Password [facility] 06/07/2017, 10:21 AM

## 2017-06-07 NOTE — Care Management Important Message (Signed)
Important Message  Patient Details  Name: John [name] MRN: [id number] Date of Birth: 1951-06-19   Medicare Important Message Given:  Yes    Kerin Salen 06/07/2017, 12:33 [place] Message  Patient Details  Name: John [name] MRN: [id number] Date of Birth: 1951-11-27   Medicare Important Message Given:  Yes    Kerin Salen 06/07/2017, 12:33 PM

## 2017-06-08 ENCOUNTER — Inpatient Hospital Stay (HOSPITAL_COMMUNITY): Payer: Medicare Other

## 2017-06-08 LAB — CREATININE, SERUM
CREATININE: 1.22 mg/dL (ref 0.61–1.24)
CREATININE: 2.42 mg/dL — AB (ref 0.61–1.24)
GFR calc Af Amer: >60 mL/min (ref >60)
GFR calc Af Amer: 30 mL/min — ABNORMAL LOW (ref >60)
GFR, EST NON AFRICAN AMERICAN: 26 mL/min — AB (ref >60)

## 2017-06-08 LAB — CBC
HCT: 42.8 % (ref 39.0–52.0)
Hemoglobin: 14.9 g/dL (ref 13.0–17.0)
MCH: 32.6 pg (ref 26.0–34.0)
MCHC: 34.8 g/dL (ref 30.0–36.0)
MCV: 93.7 fL (ref 78.0–100.0)
PLATELETS: 397 10*3/uL (ref 150–400)
RBC: 4.57 MIL/uL (ref 4.22–5.81)
RDW: 14 % (ref 11.5–15.5)
WBC: 12.9 10*3/uL — AB (ref 4.0–10.5)

## 2017-06-08 LAB — VANCOMYCIN, TROUGH: Vancomycin Tr: 28 ug/mL (ref 15–20)

## 2017-06-08 LAB — VANCOMYCIN, RANDOM: VANCOMYCIN RM: 23

## 2017-06-08 MED ORDER — IOPAMIDOL (ISOVUE-300) INJECTION 61%
100.0000 mL | Freq: Once | INTRAVENOUS | Status: AC | PRN
  Administered 2017-06-08: 100 mL via INTRAVENOUS

## 2017-06-08 MED ORDER — IOPAMIDOL (ISOVUE-300) INJECTION 61%
INTRAVENOUS | Status: AC
  Filled 2017-06-08: qty 100

## 2017-06-08 NOTE — Progress Notes (Signed)
Patient ID: John [name], male   DOB: [birth year], 66 y.o.   MRN: [id number]    Referring Physician(s): Dr. Jackolyn Confer  Supervising Physician: Markus Daft  Patient Status: [facility] - In-pt  Chief Complaint: Appendiceal abscess  Subjective: Patient a little upset right now, no particular reason per wife.  Just got back from walk in the hall which was very difficult for him.  Allergies: Patient has no known allergies.  Medications: Prior to Admission medications   Medication Sig Start Date End Date Taking? Authorizing Provider  B COMPLEX VITAMINS PO Take by mouth 3 (three) times daily.   Yes [provider]  folic acid (FOLVITE) 1 MG tablet Take 1 tablet (1 mg total) by mouth daily. 06/25/15  Yes Thurnell Lose, MD  loratadine (CLARITIN) 10 MG tablet Take 10 mg by mouth daily.   Yes [provider]  rivaroxaban (XARELTO) 20 MG TABS tablet Take 1 tablet (20 mg total) by mouth daily with supper. 09/18/16  Yes Gorsuch, Ni, MD  thiamine 100 MG tablet Take 1 tablet (100 mg total) by mouth daily. 06/25/15  Yes Thurnell Lose, MD  vitamin B-12 (CYANOCOBALAMIN) 1000 MCG tablet Take 1,000 mcg by mouth daily.   Yes [provider]    Vital Signs: BP 115/73 (BP Location: Left Arm)   Pulse 93   Temp 98.2 F (36.8 C) (Axillary)   Resp 17   Ht 5\' 10"  (1.778 m)   Wt 158 lb 8.2 oz (71.9 kg)   SpO2 95%   BMI 22.74 kg/m   Physical Exam: Abd: cellulitis still present, but still better, no purulent drainage noticed at drain site, but erythema of course present.  Drain with minimal output.  10cc documented yesterday.  Imaging: Ct Head Wo Contrast  Result Date: 06/08/2017 CLINICAL DATA:  Dementia with altered mental status.  Fever. EXAM: CT HEAD WITHOUT CONTRAST TECHNIQUE: Contiguous axial images were obtained from the base of the skull through the vertex without intravenous contrast. COMPARISON:  June 01, 2017 FINDINGS: Brain: Moderately severe diffuse atrophy  remains stable. There is no intracranial mass, hemorrhage, extra-axial fluid collection, or midline shift. There is mild small vessel disease in the centra semiovale bilaterally. Elsewhere gray-white compartments appear normal. No acute infarct evident. Vascular: No hyperdense vessel. There is calcification in each carotid siphon region, more on the left than on the right. Skull: Bony calvarium appears intact. Sinuses/Orbits: Visualized paranasal sinuses are clear. Visualized orbits appear symmetric bilaterally. Other: Mastoid air cells are clear. IMPRESSION: Stable diffuse atrophy. Slight periventricular small vessel disease. No intracranial mass, hemorrhage, or extra-axial fluid collection. No evidence of acute infarct. There are foci of arterial vascular calcification. Electronically Signed   By: Lowella Grip III M.D.   On: 06/08/2017 10:48   Ct Soft Tissue Neck W Contrast  Result Date: 06/08/2017 CLINICAL DATA:  Neck pain. Patient admitted with perforated appendicitis. EXAM: CT NECK WITH CONTRAST TECHNIQUE: Multidetector CT imaging of the neck was performed using the standard protocol following the bolus administration of intravenous contrast. CONTRAST:  157mL ISOVUE-300 IOPAMIDOL (ISOVUE-300) INJECTION 61% COMPARISON:  None. FINDINGS: Pharynx and larynx: No evidence of mass or swelling. Salivary glands: No inflammation, mass, or stone. Thyroid: Normal. Lymph nodes: None enlarged or abnormal density. Vascular: Moderate calcified plaque at the common carotid bifurcations. Limited intracranial: Atherosclerotic calcification. No acute finding. Visualized orbits: Negative. Mastoids and visualized paranasal sinuses: Negative Skeleton: Head is mildly rotated to the right without subluxation, fracture, or aggressive bone lesion. Generalized cervical  disc narrowing and uncovertebral spurring with foraminal stenosis primarily from C3-4 to C6-7. Upper chest: Negative IMPRESSION: 1. No acute or inflammatory  finding. 2. Cervical disc degeneration with bilateral foraminal narrowing from C3-4 to C6-7. Electronically Signed   By: Monte Fantasia M.D.   On: 06/08/2017 11:07   Ct Abdomen Pelvis W Contrast  Result Date: 06/08/2017 CLINICAL DATA:  Perforated appendicitis with abscess status post percutaneous drainage, cellulitis, dementia EXAM: CT ABDOMEN AND PELVIS WITH CONTRAST TECHNIQUE: Multidetector CT imaging of the abdomen and pelvis was performed using the standard protocol following bolus administration of intravenous contrast. CONTRAST:  17mL ISOVUE-300 IOPAMIDOL (ISOVUE-300) INJECTION 61% COMPARISON:  05/26/2017 FINDINGS: Lower chest: Small bilateral pleural effusions. Associated bilateral lower lobe opacities, likely atelectasis. Hepatobiliary: Liver is within normal limits. Gallbladder is unremarkable. No intrahepatic or extrahepatic ductal dilatation. Pancreas: Within normal limits. Spleen: Calcified splenic granulomata. Adrenals/Urinary Tract: Adrenal glands are within normal limits. Kidneys are notable for bilateral renal sinus cysts. No hydronephrosis. Bladder is mildly thick-walled anteriorly although underdistended (series 2/image 82). Stomach/Bowel: Stomach is within normal limits. No evidence of bowel obstruction. Changes related to recent appendicitis with percutaneous drainage of periappendiceal abscess. Indwelling pigtail drainage catheter (series 2/image 57) without residual fluid collection. Sigmoid diverticulosis, without evidence of diverticulitis. Vascular/Lymphatic: No evidence of abdominal aortic aneurysm. No suspicious abdominopelvic lymphadenopathy. Reproductive: Prostate is grossly unremarkable. Other: No abdominopelvic ascites. Moderate right and small left fat containing inguinal hernias. Musculoskeletal: Overlying inflammatory changes/hematoma along the course of the catheter in the right lateral abdominal wall (series 2/image 57). Suspected 4.1 x 1.7 cm intramuscular hematoma (series  14/ image 29). Mild degenerative changes of the lumbar spine. IMPRESSION: Status post percutaneous drainage of periappendiceal abscess related to acute appendicitis. No residual fluid collection in the right lower quadrant. Suspected 4.1 x 1.7 cm intramuscular hematoma along the right lateral abdominal wall, along the course of the pigtail drainage catheter. Overlying inflammatory changes. Small bilateral pleural effusions. Associated bilateral lower lobe opacities, likely atelectasis. Electronically Signed   By: Julian Hy M.D.   On: 06/08/2017 11:06    Labs:  CBC:  Recent Labs  06/05/17 1113 06/06/17 0740 06/07/17 0742 06/08/17 0907  WBC 17.2* 12.1* 13.3* 12.9*  HGB 13.9 14.0 14.3 14.9  HCT 40.2 40.0 41.4 42.8  PLT 280 313 365 397    COAGS:  Recent Labs  06/01/17 2147  INR 1.40    BMP:  Recent Labs  06/02/17 0723 06/03/17 0942 06/04/17 0506  06/06/17 0550 06/07/17 0458 06/07/17 0742 06/08/17 0514  NA 137 137 138  --   --   --  140  --   K 3.5 3.6 3.5  --   --   --  3.4*  --   CL 105 104 104  --   --   --  107  --   CO2 21* 25 24  --   --   --  24  --   GLUCOSE 107* 106* 111*  --   --   --  115*  --   BUN 13 13 13   --   --   --  6  --   CALCIUM 8.3* 8.4* 8.3*  --   --   --  8.4*  --   CREATININE 0.89 0.97 0.85  < > 0.91 0.87 0.88 1.22  GFRNONAA >60 >60 >60  < > >60 >60 >60 >60  GFRAA >60 >60 >60  < > >60 >60 >60 >60  < > = values in  this interval not displayed.  LIVER FUNCTION TESTS:  Recent Labs  07/24/16 1525 06/01/17 1219  BILITOT 0.49 0.9  AST 20 38  ALT 17 47  ALKPHOS 61 98  PROT 7.4 7.6  ALBUMIN 3.9 3.4*    Assessment and Plan: 1. Ruptured appendicitis with periappendiceal abscess, s/p perc drain on 6/27  CT scan reviewed with Dr. Anselm Pancoast.  Fluid collection seems essentially resolved, but it does appear that he has a hematoma in the subcut area around the drain entry site.  We will plan to inject the drain on Thursday.  If there is no  fistula, the drain can be removed.  We would watch for bleeding given the fluid on the scan and his xarelto.  Will follow.  Electronically Signed: Henreitta Cea 06/08/2017, 3:37 PM   I spent a total of 15 Minutes at the the patient's bedside AND on the patient's hospital floor or unit, greater than 50% of which was counseling/coordinating care for appendiceal abscess

## 2017-06-08 NOTE — Progress Notes (Signed)
CM following for DC needs. Per CSW, pt to be faxed out to SNFs for bed options. CM will assist with home health needs should they arise. Marney Doctor RN,BSN,NCM [phone]

## 2017-06-08 NOTE — Progress Notes (Signed)
Nutrition Follow-up  INTERVENTION:   Continue Boost Breeze po TID, each supplement provides 250 kcal and 9 grams of protein RD to continue to monitor  NUTRITION DIAGNOSIS:   Inadequate oral intake related to lethargy/confusion as evidenced by meal completion < 25%.  Ongoing, currently sedated.  GOAL:   Patient will meet greater than or equal to 90% of their needs  Progressing.  MONITOR:   PO intake, Supplement acceptance, Labs, Weight trends, Diet advancement, I & O's   ASSESSMENT:   66 y.o. 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 Patient presented with abdominal pain, decrease oral intake, progressive weakness. He is found to have appendicitis with perforation and abscess. General surgery consulted for evaluation. Recommending IR percutaneous drain.  Patient now on regular diet. Refused breakfast this morning d/t sedation for pending CT scans. Pt was consuming 30-50% of meals 7/2. Pt has been drinking the Boost Breeze supplements, will continue order. Pt has now had a BM.  Medications: IV Folic acid daily, IV Thiamine daily Labs reviewed: Low K  Diet Order:  Diet regular Room service appropriate? Yes; Fluid consistency: Thin  Skin:  Reviewed, no issues  Last BM:  7/1  Height:   Ht Readings from Last 1 Encounters:  06/02/17 5\' 10"  (1.778 m)    Weight:   Wt Readings from Last 1 Encounters:  06/02/17 158 lb 8.2 oz (71.9 kg)    Ideal Body Weight:  75.5 kg  BMI:  Body mass index is 22.74 kg/m.  Estimated Nutritional Needs:   Kcal:  1800-2000  Protein:  80-90g  Fluid:  2L/day  EDUCATION NEEDS:   No education needs identified at this time  Clayton Bibles, MS, RD, LDN Pager: [phone] After Hours Pager: [phone]

## 2017-06-08 NOTE — Progress Notes (Signed)
Patient ID: John [name], male   DOB: [birth year], 66 y.o.   MRN: [id number]  [facility] Surgery Progress Note     Subjective: CC- perforated appendicitis s/p perc drain Patient resting comfortably. Just received IV ativan to help him tolerate CT scans today. Per RN patient is doing well. Had a BM this morning. No n/v. Tolerating diet, but not taking in a lot.   Objective: Vital signs in last 24 hours: Temp:  [97.7 F (36.5 C)-98 F (36.7 C)] 98 F (36.7 C) (07/03 0438) Pulse Rate:  [66-68] 68 (07/03 0438) Resp:  [16] 16 (07/03 0438) BP: (109-120)/(64-78) 112/64 (07/03 0438) SpO2:  [96 %-97 %] 96 % (07/03 0438) Last BM Date: 06/06/17  Intake/Output from previous day: 07/02 0701 - 07/03 0700 In: 3230 [P.O.:180; I.V.:2400; IV Piggyback:650] Out: 1510 [Urine:1500; Drains:10] Intake/Output this shift: No intake/output data recorded.  PE: Gen:  Alert, NAD, cooperative HEENT: EOM's intact, pupils equal  Card:  RRR, no M/G/R heard Pulm:  CTAB, no W/R/R, effort normal Abd: Soft, ND, mild global tenderness, +BS, drain with trace serosanguinous fluid with some sediment in bag, surrounding erythema continues to decrease Ext:  No erythema, edema, or tenderness BUE/BLE   Lab Results:   Recent Labs  06/06/17 0740 06/07/17 0742  WBC 12.1* 13.3*  HGB 14.0 14.3  HCT 40.0 41.4  PLT 313 365   BMET  Recent Labs  06/07/17 0742 06/08/17 0514  NA 140  --   K 3.4*  --   CL 107  --   CO2 24  --   GLUCOSE 115*  --   BUN 6  --   CREATININE 0.88 1.22  CALCIUM 8.4*  --    PT/INR No results for input(s): LABPROT, INR in the last 72 hours. CMP     Component Value Date/Time   NA 140 06/07/2017 0742   NA 141 07/24/2016 1525   K 3.4 (L) 06/07/2017 0742   K 4.4 07/24/2016 1525   CL 107 06/07/2017 0742   CO2 24 06/07/2017 0742   CO2 27 07/24/2016 1525   GLUCOSE 115 (H) 06/07/2017 0742   GLUCOSE 95 07/24/2016 1525   BUN 6 06/07/2017 0742   BUN 14.8 07/24/2016 1525    CREATININE 1.22 06/08/2017 0514   CREATININE 0.9 07/24/2016 1525   CALCIUM 8.4 (L) 06/07/2017 0742   CALCIUM 10.0 07/24/2016 1525   PROT 7.6 06/01/2017 1219   PROT 7.4 07/24/2016 1525   ALBUMIN 3.4 (L) 06/01/2017 1219   ALBUMIN 3.9 07/24/2016 1525   AST 38 06/01/2017 1219   AST 20 07/24/2016 1525   ALT 47 06/01/2017 1219   ALT 17 07/24/2016 1525   ALKPHOS 98 06/01/2017 1219   ALKPHOS 61 07/24/2016 1525   BILITOT 0.9 06/01/2017 1219   BILITOT 0.49 07/24/2016 1525   GFRNONAA >60 06/08/2017 0514   GFRAA >60 06/08/2017 0514   Lipase  No results found for: LIPASE     Studies/Results: No results found.  Anti-infectives: Anti-infectives    Start     Dose/Rate Route Frequency Ordered Stop   06/04/17 1200  vancomycin (VANCOCIN) IVPB 1000 mg/200 mL premix     1,000 mg 200 mL/hr over 60 Minutes Intravenous Every 12 hours 06/04/17 1115     06/02/17 0000  piperacillin-tazobactam (ZOSYN) IVPB 3.375 g     3.375 g 12.5 mL/hr over 240 Minutes Intravenous Every 8 hours 06/01/17 2103     06/01/17 1745  piperacillin-tazobactam (ZOSYN) IVPB 3.375 g  3.375 g 100 mL/hr over 30 Minutes Intravenous  Once 06/01/17 1730 06/01/17 1839   06/01/17 1730  piperacillin-tazobactam (ZOSYN) IVPB 4.5 g  Status:  Discontinued     4.5 g 200 mL/hr over 30 Minutes Intravenous  Once 06/01/17 1728 06/01/17 1730       Assessment/Plan Dementia, h/o TIB 1998 H/o PE - on Xarelto at home Polycythemia vera  Ruptured appendicitis with abscess s/p per drain 6/27 - multiple organisms present on culture - developed cellulitis around drain site >> on vancomycin/zosyn - drain with 10cc output in 24hr - patient is afebrile  ID - zosyn 6/26>>day#8, vancomycin 6/29>>day#5 FEN - regular diet VTE - Xarelto  Plan - CT scan pending.  Check CBC.    LOS: 7 days    Jerrye Beavers , [facility] Surgery 06/08/2017, 8:08 AM Pager: [phone] Consults: [phone] Mon-Fri 7:00 am-4:30  pm Sat-Sun 7:00 am-11:30 am

## 2017-06-08 NOTE — Progress Notes (Signed)
Physical Therapy Treatment Patient Details Name: John [name] MRN: [id number] DOB: [birth year] Today's Date: 06/08/2017    History of Present Illness John [name] is a 66 y.o. 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  Probable ruptured appendix with abcess. Post drain placed.                   PT Comments    Pt progressing slowly.  Remains confused.  Assisted OOB to attempt amb   Required + 2 assist.  Spouse Terry present and assisted by f ollowing with recliner.   At this point, pt will more likely need SNF.  Follow Up Recommendations  Home health PT;SNF;Supervision/Assistance - 24 hour (pending progress)     Equipment Recommendations  None recommended by PT    Recommendations for Other Services       Precautions / Restrictions Precautions Precautions: Fall Precaution Comments: Hx TBI/ETOH easily agitated, right drain from abdomen, does better with family present Restrictions Weight Bearing Restrictions: No    Mobility  Bed Mobility Overal bed mobility: Needs Assistance Bed Mobility: Supine to Sit     Supine to sit: +2 for physical assistance;+2 for safety/equipment;HOB elevated;Total assist Sit to supine: +2 for safety/equipment;+2 for physical assistance   General bed mobility comments: much encouragement and + 2 assist to transfer from supine to EOB.  Resistant.  Pushing.  Unable to follow simple commands. Required 5 min sitting EOB before pt was able to support self.  Severe R lean.  Transfers Overall transfer level: Needs assistance Equipment used: 2 person hand held assist Transfers: Sit to/from Stand Sit to Stand: +2 physical assistance;+2 safety/equipment;Total assist          General transfer comment: + 2 side by side assist MAX physical Assist to support pt upright.  Severe R lean.  Poor self correction to midline.  Required assist to sit as pt resisted.  Ambulation/Gait Ambulation/Gait assistance: Total assist;+2 physical assistance;+2 safety/equipment Ambulation Distance (Feet): 12 Feet Assistive device: Rolling walker (2 wheeled) Gait Pattern/deviations: Step-to pattern;Step-through pattern;Decreased step length - right;Decreased step length - left;Decreased weight shift to left;Ataxic;Festinating;Scissoring;Staggering right     General Gait Details: + 2 side by side assist with Total support to advbance(push) pt forward to initate steps.  Pt resistant.  Severe R lean.  Poor stepping.  Spouse Terry assisted by following with recliner.     Stairs            Wheelchair Mobility    Modified Rankin (Stroke Patients Only)       Balance                                            Cognition   Behavior During Therapy: Agitated;Anxious Overall Cognitive Status: Impaired/Different from baseline Area of Impairment: Following commands;Safety/judgement;Orientation;Attention;Memory;Problem solving;Awareness                               General Comments: more arroused but unable to follow commands.  Responds to name.  Brief eye contact.  Mixed random words expressive  Exercises      General Comments        Pertinent Vitals/Pain Pain Assessment: No/denies pain Pain Location: difficult to access     Home Living                      Prior Function            PT Goals (current goals can now be found in the care plan section) Progress towards PT goals: Progressing toward goals    Frequency    Min 3X/week      PT Plan      Co-evaluation              AM-PAC PT "6 Clicks" Daily Activity  Outcome Measure  Difficulty turning over in bed (including adjusting bedclothes, sheets and  blankets)?: Total Difficulty moving from lying on back to sitting on the side of the bed? : Total Difficulty sitting down on and standing up from a chair with arms (e.g., wheelchair, bedside commode, etc,.)?: Total Help needed moving to and from a bed to chair (including a wheelchair)?: Total Help needed walking in hospital room?: Total Help needed climbing 3-5 steps with a railing? : Total 6 Click Score: 6    End of Session Equipment Utilized During Treatment: Gait belt Activity Tolerance: Other (comment) (limited by cognition) Patient left: in chair;with chair alarm set;with family/visitor present;with call bell/phone within reach Nurse Communication: Mobility status PT Visit Diagnosis: Unsteadiness on feet (R26.81)     Time: 1335-1400 PT Time Calculation (min) (ACUTE ONLY): 25 min  Charges:  $Gait Training: 8-22 mins $Therapeutic Activity: 8-22 mins                    G Codes:       {Berkley Cronkright  PTA [facility]  Acute  Rehab Pager      [phone]

## 2017-06-08 NOTE — Progress Notes (Signed)
CSW spoke with patient's wife at bedside regarding discharge planning. Patient's wife reported that she would like patient to return home at discharge if possible. Patient's wife reported that she was unaware of the plan at discharge and was waiting to see how patient progressed. Patient's wife reported that if necessary she would consider SNF for ST rehab and granted CSW verbal permission to obtain PASRR and fax out FL2. CSW started PASRR process and it is currently under manual review MUST ID [id number]. CSW will complete FL2 and provide bed offers to patient's wife as an option if needed. CSW will continue to follow and assist with discharge planning if needed.   Abundio Miu, [place] Social Worker [facility] Cell#: [phone]

## 2017-06-08 NOTE — Progress Notes (Signed)
PROGRESS NOTE    John [name]  VOZ:[id number] DOB: 08-16-1951 DOA: 06/01/2017 PCP: Cari Caraway, MD   Brief Narrative: John [name] is a 66 y.o. 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 Patient presented with abdominal pain, decrease oral intake, progressive weakness. He is found to have appendicitis with perforation and abscess. General surgery consulted for evaluation. He is s/p percutaneous drainage on 6/27. He has developed cellulitis since then. Wound cultures significant for multiple organisms and he was broadened to vancomycin/Zosyn to cover, in addition to cellulitis. Overall, improving very slowly. Repeat CT planned for 7/3. CT head/neck planned for 7/3. Disposition is likely SNF.   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 S/p percutaneous drain on 06/02/17. Afebrile overnight. WBC stable. Culture is significant for multiple organisms including gram positive cocci in pairs, gram negative coccobacilli and gram positive rods. -General surgery/IR recommendations -Continue Zosyn/Vancomycin -Continue IV fluids  Appendicitis w/ rupture/abscess Abdominal pain Stable -continue drain per IR -cultures pending -repeat abdominal CT  Cellulitis Over drain site. Improved. -Continue zosyn -continue vancomycin  Somnolence Resolved. Likely secondary to dilaudid  Dementia Patient is currently not agitated/violent -Continue Ativan when necessary  History of PE Patient on Xarelto as an outpatient. This was held on admission. Started on Lovenox VTE prophylaxis dose on admission. S/p perc drain. -continue Xarelto  Neck  pain Unknown cause. Possible torticollis. Difficult in setting of cognitive impairment -CT neck   DVT prophylaxis: Xarelto Code Status: Full code Family Communication: Wife at bedside Disposition Plan: Discharge pending management of appendicitis, PT recommendations   Consultants:   General surgery  Interventional radiology  Procedures:   Percutaneous drainage (06/02/17)  Antimicrobials:  Zosyn  Vancomycin   Subjective: Somnolent secondary to morphine and ativan.  Objective: Vitals:   06/07/17 0542 06/07/17 1350 06/07/17 2031 06/08/17 0438  BP: 125/84 120/78 109/64 112/64  Pulse: 78 67 66 68  Resp: 16 16 16 16   Temp: 97.5 F (36.4 C) 97.7 F (36.5 C) 97.7 F (36.5 C) 98 F (36.7 C)  TempSrc: Axillary Axillary Oral Oral  SpO2: 96% 96% 97% 96%  Weight:      Height:        Intake/Output Summary (Last 24 hours) at 06/08/17 0845 Last data filed at 06/08/17 0600  Gross per 24 hour  Intake             3230 ml  Output             1510 ml  Net             1720 ml   Filed Weights   06/01/17 2048 06/02/17 0948  Weight: 75.3 kg (166 lb) 71.9 kg (158 lb 8.2 oz)    Examination:  General exam: No distress Respiratory system: Clear to auscultation bilaterally. Unlabored work of breathing. No wheezing or rales. Cardiovascular system: Regular rate and rhythm. Normal S1 and S2. No heart murmurs present. No extra heart sounds Gastrointestinal system: Non-distended, soft, tenderness diffusely. Central nervous system: Somnolent, not oriented.  Extremities: No edema or leg tenderness Skin: No cyanosis. Erythematous rash over drain is improved. No wound drainage. Psychiatry: Judgement and  insight impaired. Flat affect.     Data Reviewed: I have personally reviewed following labs and imaging studies  CBC:  Recent Labs Lab 06/01/17 1219  06/03/17 0942 06/04/17 0506 06/05/17 1113 06/06/17 0740 06/07/17 0742  WBC 18.2*  < > 19.8* 15.3* 17.2* 12.1* 13.3*   NEUTROABS 13.9*  --  16.6*  --   --   --   --   HGB 16.2  < > 14.4 13.9 13.9 14.0 14.3  HCT 47.0  < > 41.2 40.1 40.2 40.0 41.4  MCV 95.9  < > 92.8 92.4 93.9 93.2 93.7  PLT 276  < > 252 252 280 313 365  < > = values in this interval not displayed. Basic Metabolic Panel:  Recent Labs Lab 06/01/17 1219 06/02/17 0723 06/03/17 0174 06/04/17 0506 06/05/17 0602 06/06/17 0550 06/07/17 0458 06/07/17 0742 06/08/17 0514  NA 138 137 137 138  --   --   --  140  --   K 4.0 3.5 3.6 3.5  --   --   --  3.4*  --   CL 100* 105 104 104  --   --   --  107  --   CO2 27 21* 25 24  --   --   --  24  --   GLUCOSE 124* 107* 106* 111*  --   --   --  115*  --   BUN 16 13 13 13   --   --   --  6  --   CREATININE 0.96 0.89 0.97 0.85 0.74 0.91 0.87 0.88 1.22  CALCIUM 9.3 8.3* 8.4* 8.3*  --   --   --  8.4*  --    GFR: Estimated Creatinine Clearance: 60.6 mL/min (by C-G formula based on SCr of 1.22 mg/dL). Liver Function Tests:  Recent Labs Lab 06/01/17 1219  AST 38  ALT 47  ALKPHOS 98  BILITOT 0.9  PROT 7.6  ALBUMIN 3.4*   No results for input(s): LIPASE, AMYLASE in the last 168 hours. No results for input(s): AMMONIA in the last 168 hours. Coagulation Profile:  Recent Labs Lab 06/01/17 2147  INR 1.40   Cardiac Enzymes:  Recent Labs Lab 06/01/17 1354  TROPONINI <0.03   BNP (last 3 results) No results for input(s): PROBNP in the last 8760 hours. HbA1C: No results for input(s): HGBA1C in the last 72 hours. CBG:  Recent Labs Lab 06/01/17 1221  GLUCAP 113*   Lipid Profile: No results for input(s): CHOL, HDL, LDLCALC, TRIG, CHOLHDL, LDLDIRECT in the last 72 hours. Thyroid Function Tests: No results for input(s): TSH, T4TOTAL, FREET4, T3FREE, THYROIDAB in the last 72 hours. Anemia Panel: No results for input(s): VITAMINB12, FOLATE, FERRITIN, TIBC, IRON, RETICCTPCT in the last 72 hours. Sepsis Labs:  Recent Labs Lab 06/01/17 1358 06/01/17 1658 06/01/17 2147  PROCALCITON   --   --  0.42  LATICACIDVEN 1.4 0.9  --     Recent Results (from the past 240 hour(s))  Culture, blood (routine x 2)     Status: None   Collection Time: 06/01/17  3:19 PM  Result Value Ref Range Status   Specimen Description BLOOD RIGHT ANTECUBITAL  Final   Special Requests   Final    BOTTLES DRAWN AEROBIC AND ANAEROBIC Blood Culture adequate volume   Culture   Final    NO GROWTH 5 DAYS Performed at [facility], [place] [address] [place], [place] [address]    Report Status 06/06/2017 FINAL  Final  Culture, blood (routine x 2)     Status: None   Collection Time: 06/01/17  4:04 PM  Result Value Ref Range Status   Specimen Description BLOOD LEFT FOREARM  Final   Special Requests   Final    BOTTLES DRAWN AEROBIC AND ANAEROBIC Blood Culture adequate volume   Culture   Final    NO GROWTH 5 DAYS Performed at [facility], [address] [place], [place] [address]    Report Status 06/06/2017 FINAL  Final  Surgical PCR screen     Status: None   Collection Time: 06/02/17  4:04 PM  Result Value Ref Range Status   MRSA, PCR NEGATIVE NEGATIVE Final   Staphylococcus aureus NEGATIVE NEGATIVE Final    Comment:        The Xpert SA Assay (FDA approved for NASAL specimens in patients over 77 years of age), is one component of a comprehensive surveillance program.  Test performance has been validated by [facility] for patients greater than or equal to 69 year old. It is not intended to diagnose infection nor to guide or monitor treatment.   Aerobic/Anaerobic Culture (surgical/deep wound)     Status: Abnormal   Collection Time: 06/02/17  5:10 PM  Result Value Ref Range Status   Specimen Description ABSCESS RLQ CT DRAIN  Final   Special Requests NONE  Final   Gram Stain   Final    MODERATE WBC PRESENT, PREDOMINANTLY PMN ABUNDANT GRAM POSITIVE COCCI IN PAIRS ABUNDANT GRAM NEGATIVE COCCOBACILLI FEW GRAM POSITIVE RODS Performed at [facility], [place] [address],  [place], [place] [address]    Culture (A)  Final    MULTIPLE ORGANISMS PRESENT, NONE PREDOMINANT MIXED ANAEROBIC FLORA PRESENT.  CALL LAB IF FURTHER IID REQUIRED.    Report Status 06/07/2017 FINAL  Final         Radiology Studies: No results found.      Scheduled Meds: . feeding supplement  1 Container Oral TID BM  . folic acid  1 mg Intravenous Daily  . magnesium hydroxide  30 mL Oral Daily  . rivaroxaban  20 mg Oral Q supper  . thiamine  100 mg Intravenous Daily   Continuous Infusions: . sodium chloride 100 mL/hr at 06/06/17 2357  . famotidine (PEPCID) IV 20 mg (06/08/17 0802)  . piperacillin-tazobactam (ZOSYN)  IV Stopped (06/08/17 0001)  . vancomycin Stopped (06/08/17 0101)     LOS: 7 days     Cordelia Poche, MD [facility] 06/08/2017, 8:45 AM Pager: [phone]  If 7PM-7AM, please contact night-coverage [email or web address] Password [email or web address] 06/08/2017, 8:45 AM

## 2017-06-08 NOTE — Progress Notes (Addendum)
Pharmacy Antibiotic Note  John [name] is a 66 y.o. male presented to the ED on 06/01/2017 with c/o AMS, weakness and poor oral intake.  Abd CT showed ruptured appendicitis with abscess -- perc drain placed by IR on 06/02/17.  Zosyn was started on 6/26 for abscess and vancomycin added on 6/29 for cellulitis around drain site.  Today, 06/08/2017: - day #8 zosyn, day #5 vancomycin - last vancomycin trough level on 06/06/17 was therapeutic at 12 with current dose of 1gm IV q12h -  afeb, wbc 12.9 - scr up 1.22 (crcl~60) --> vancomycin trough level checked this afternoon now back supra- therapeutic at 28 (goal 10-15)  Plan: -  Hold vancomycin for now.  Check random vancomycin level at 11p tonight and scr.  Will re-dose if level is <15 - continue zosyn 3.375 gn IV q8h (infuse over 4 hrs)  ____________________________  Height: 5\' 10"  (177.8 cm) Weight: 158 lb 8.2 oz (71.9 kg) IBW/kg (Calculated) : 73  Temp (24hrs), Avg:97.8 F (36.6 C), Min:97.7 F (36.5 C), Max:98 F (36.7 C)   Recent Labs Lab 06/01/17 1358 06/01/17 1658  06/03/17 0942 06/04/17 0506 06/05/17 0602 06/05/17 1113 06/06/17 0550 06/06/17 0740 06/06/17 1223 06/07/17 0458 06/07/17 0742 06/08/17 0514  WBC  --   --   < > 19.8* 15.3*  --  17.2*  --  12.1*  --   --  13.3*  --   CREATININE  --   --   < > 0.97 0.85 0.74  --  0.91  --   --  0.87 0.88 1.22  LATICACIDVEN 1.4 0.9  --   --   --   --   --   --   --   --   --   --   --   VANCOTROUGH  --   --   --   --   --   --   --   --   --  12*  --   --   --   < > = values in this interval not displayed.  Estimated Creatinine Clearance: 60.6 mL/min (by C-G formula based on SCr of 1.22 mg/dL).    No Known Allergies  Antimicrobials this admission: 6/26 Zosyn >>  6/29 Vanc >>   Dose adjustments this admission: 7/1 VT = 12, on 1g q12h, continue same. 7/3 at 1300 VT = 28 (drawn 1 hr late, on 1gm q12h)   Microbiology results: 6/26 BCx x2: neg FINAL 6/27 abscess cx: mult  spp. None predominant. Holding for anaerobe.  6/27 MRSA PCR: neg  Thank you for allowing pharmacy to be a part of this patient's care.  Lynelle Doctor 06/08/2017 8:48 AM

## 2017-06-09 ENCOUNTER — Inpatient Hospital Stay (HOSPITAL_COMMUNITY): Payer: Medicare Other

## 2017-06-09 DIAGNOSIS — N179 Acute kidney failure, unspecified: Secondary | ICD-10-CM

## 2017-06-09 LAB — BASIC METABOLIC PANEL
ANION GAP: 10 (ref 5–15)
BUN: 15 mg/dL (ref 6–20)
CALCIUM: 9 mg/dL (ref 8.9–10.3)
CO2: 25 mmol/L (ref 22–32)
Chloride: 111 mmol/L (ref 101–111)
Creatinine, Ser: 2.54 mg/dL — ABNORMAL HIGH (ref 0.61–1.24)
GFR calc non Af Amer: 25 mL/min — ABNORMAL LOW (ref >60)
GFR, EST AFRICAN AMERICAN: 29 mL/min — AB (ref >60)
GLUCOSE: 117 mg/dL — AB (ref 65–99)
Potassium: 3.7 mmol/L (ref 3.5–5.1)
SODIUM: 146 mmol/L — AB (ref 135–145)

## 2017-06-09 LAB — URINALYSIS, ROUTINE W REFLEX MICROSCOPIC
Bacteria, UA: NONE SEEN
Bilirubin Urine: NEGATIVE
GLUCOSE, UA: NEGATIVE mg/dL
Ketones, ur: NEGATIVE mg/dL
LEUKOCYTES UA: NEGATIVE
NITRITE: NEGATIVE
PROTEIN: NEGATIVE mg/dL
SPECIFIC GRAVITY, URINE: 1.011 (ref 1.005–1.030)
Squamous Epithelial / LPF: NONE SEEN
pH: 6 (ref 5.0–8.0)

## 2017-06-09 LAB — SODIUM, URINE, RANDOM: SODIUM UR: 96 mmol/L

## 2017-06-09 LAB — CREATININE, URINE, RANDOM: CREATININE, URINE: 41.5 mg/dL

## 2017-06-09 LAB — CBC
HCT: 43.8 % (ref 39.0–52.0)
HEMOGLOBIN: 15 g/dL (ref 13.0–17.0)
MCH: 32.6 pg (ref 26.0–34.0)
MCHC: 34.2 g/dL (ref 30.0–36.0)
MCV: 95.2 fL (ref 78.0–100.0)
Platelets: 384 10*3/uL (ref 150–400)
RBC: 4.6 MIL/uL (ref 4.22–5.81)
RDW: 14.3 % (ref 11.5–15.5)
WBC: 13.4 10*3/uL — AB (ref 4.0–10.5)

## 2017-06-09 MED ORDER — LACTATED RINGERS IV BOLUS (SEPSIS)
1000.0000 mL | Freq: Once | INTRAVENOUS | Status: AC
  Administered 2017-06-09: 1000 mL via INTRAVENOUS

## 2017-06-09 MED ORDER — VITAMIN B-1 100 MG PO TABS
100.0000 mg | ORAL_TABLET | Freq: Every day | ORAL | Status: DC
  Administered 2017-06-09 – 2017-06-14 (×5): 100 mg via ORAL
  Filled 2017-06-09 (×6): qty 1

## 2017-06-09 MED ORDER — FOLIC ACID 1 MG PO TABS
1.0000 mg | ORAL_TABLET | Freq: Every day | ORAL | Status: DC
  Administered 2017-06-09 – 2017-06-14 (×6): 1 mg via ORAL
  Filled 2017-06-09 (×6): qty 1

## 2017-06-09 MED ORDER — FAMOTIDINE 20 MG PO TABS
20.0000 mg | ORAL_TABLET | Freq: Every day | ORAL | Status: DC
  Administered 2017-06-09 – 2017-06-14 (×6): 20 mg via ORAL
  Filled 2017-06-09 (×6): qty 1

## 2017-06-09 MED ORDER — DEXTROSE-NACL 5-0.45 % IV SOLN
INTRAVENOUS | Status: DC
  Administered 2017-06-09 – 2017-06-14 (×12): via INTRAVENOUS

## 2017-06-09 NOTE — NC FL2 (Signed)
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 Sepsis ([place]) 06/01/2017  . Ruptured appendix 06/01/2017  . Abscess of appendix 06/01/2017  . Agitation 06/01/2017  . Polycythemia vera ([place]) 11/07/2015  . History of pulmonary embolism 06/24/2015  . Dyspnea 06/24/2015  . Essential hypertension 06/24/2015  . Alcohol abuse   . Ataxia 07/05/2013  . 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[place]) 07/05/2013    Orientation RESPIRATION BLADDER Height & Weight     Self  Normal Incontinent, External catheter Weight: 158 lb 8.2 oz (71.9 kg) Height:  5\' 10"  (177.8 cm)  BEHAVIORAL SYMPTOMS/MOOD NEUROLOGICAL BOWEL NUTRITION STATUS      Incontinent Diet  AMBULATORY STATUS COMMUNICATION OF NEEDS Skin   Extensive Assist Verbally Normal                       Personal Care Assistance Level of Assistance  Bathing, Feeding, Dressing Bathing Assistance: Limited assistance Feeding assistance: Limited assistance Dressing Assistance: Limited assistance     Functional Limitations Info             SPECIAL CARE FACTORS FREQUENCY  PT (By licensed PT), OT (By licensed OT)     PT Frequency: 5x OT Frequency: 5x            Contractures Contractures Info: Not present    Additional Factors Info  Code Status, Allergies Code  Status Info: Full Code Allergies Info: NKA           Current Medications (06/09/2017):  This is the current hospital active medication list Current Facility-Administered Medications  Medication Dose Route Frequency Provider Last Rate Last Dose  . acetaminophen (TYLENOL) tablet 650 mg  650 mg Oral Q6H PRN Lily Kocher, MD   650 mg at 06/03/17 0456   Or  . acetaminophen (TYLENOL) suppository 650 mg  650 mg Rectal Q6H PRN Lily Kocher, MD      . dextrose 5 %-0.45 % sodium chloride infusion   Intravenous Continuous Regalado, Belkys A, MD      . famotidine (PEPCID) tablet 20 mg  20 mg Oral Daily Regalado, Belkys A, MD      . feeding supplement (BOOST / RESOURCE BREEZE) liquid 1 Container  1 Container Oral TID BM Mariel Aloe, MD   1 Container at 06/08/17 1845  . folic acid (FOLVITE) tablet 1 mg  1 mg Oral Daily Regalado, Belkys A, MD      . lactated ringers bolus 1,000 mL  1,000 mL Intravenous Once Stark Klein, MD 500 mL/hr at 06/09/17 0936 1,000 mL at 06/09/17 0936  . LORazepam (ATIVAN) injection 0.5 mg  0.5 mg Intravenous BID PRN Lily Kocher, MD   0.5 mg at 06/08/17 0802  . magnesium hydroxide (MILK OF MAGNESIA) suspension 30 mL  30 mL Oral Daily  Jackolyn Confer, MD   30 mL at 06/08/17 1148  . morphine 4 MG/ML injection 2 mg  2 mg Intravenous Q4H PRN Mariel Aloe, MD   2 mg at 06/08/17 0801  . ondansetron (ZOFRAN) tablet 4 mg  4 mg Oral Q6H PRN Lily Kocher, MD       Or  . ondansetron [facility]) injection 4 mg  4 mg Intravenous Q6H PRN Lily Kocher, MD      . piperacillin-tazobactam (ZOSYN) IVPB 3.375 g  3.375 g Intravenous Q8H Luiz Ochoa, [place]   Stopped at 06/09/17 5501  . thiamine (VITAMIN B-1) tablet 100 mg  100 mg Oral Daily Regalado, Belkys A, MD         Discharge Medications: Please see discharge summary for a list of discharge medications.  Relevant Imaging Results:  Relevant Lab Results:   Additional Information SSN [id number]  Burnis Medin, LCSW

## 2017-06-09 NOTE — Progress Notes (Signed)
Patient ID: John [name], male   DOB: 1951/07/21, 66 y.o.   MRN: [id number]  [facility] Progress Note     Subjective: CC- perforated appendicitis s/p perc drain Pt sleeping, aroused easily and answered some yes/no questions.  Denied nausea.  CTs without dramatic results.  Hematoma along drain site in abdominal wall.    Objective: Vital signs in last 24 hours: Temp:  [98.2 F (36.8 C)-99.2 F (37.3 C)] 99.2 F (37.3 C) (07/03 2321) Pulse Rate:  [70-93] 70 (07/03 2321) Resp:  [17-18] 18 (07/03 2321) BP: (115-125)/(73-80) 125/80 (07/03 2321) SpO2:  [95 %] 95 % (07/03 2321) Last BM Date: 06/06/17  Intake/Output from previous day: 07/03 0701 - 07/04 0700 In: 2350 [P.O.:100; I.V.:2000; IV Piggyback:250] Out: 0630 [Urine:1550] Intake/Output this shift: Total I/O In: -  Out: 750 [Urine:750]  PE: Gen: sleeping, but aroused easily.  Did seem a little confused, but answered questions appropriately Pulm:  effort normal Abd: Soft, ND, nontenderness, drain output murky.  Mild cellulitis at drain exit site, but minimal.   Ext:  No erythema, edema, or tenderness BUE/BLE   Lab Results:   Recent Labs  06/08/17 0907 06/09/17 0402  WBC 12.9* 13.4*  HGB 14.9 15.0  HCT 42.8 43.8  PLT 397 384   BMET  Recent Labs  06/07/17 0742  06/08/17 2259 06/09/17 0402  NA 140  --   --  146*  K 3.4*  --   --  3.7  CL 107  --   --  111  CO2 24  --   --  25  GLUCOSE 115*  --   --  117*  BUN 6  --   --  15  CREATININE 0.88  < > 2.42* 2.54*  CALCIUM 8.4*  --   --  9.0  < > = values in this interval not displayed. PT/INR No results for input(s): LABPROT, INR in the last 72 hours. CMP     Component Value Date/Time   NA 146 (H) 06/09/2017 0402   NA 141 07/24/2016 1525   K 3.7 06/09/2017 0402   K 4.4 07/24/2016 1525   CL 111 06/09/2017 0402   CO2 25 06/09/2017 0402   CO2 27 07/24/2016 1525   GLUCOSE 117 (H) 06/09/2017 0402   GLUCOSE 95 07/24/2016 1525   BUN 15  06/09/2017 0402   BUN 14.8 07/24/2016 1525   CREATININE 2.54 (H) 06/09/2017 0402   CREATININE 0.9 07/24/2016 1525   CALCIUM 9.0 06/09/2017 0402   CALCIUM 10.0 07/24/2016 1525   PROT 7.6 06/01/2017 1219   PROT 7.4 07/24/2016 1525   ALBUMIN 3.4 (L) 06/01/2017 1219   ALBUMIN 3.9 07/24/2016 1525   AST 38 06/01/2017 1219   AST 20 07/24/2016 1525   ALT 47 06/01/2017 1219   ALT 17 07/24/2016 1525   ALKPHOS 98 06/01/2017 1219   ALKPHOS 61 07/24/2016 1525   BILITOT 0.9 06/01/2017 1219   BILITOT 0.49 07/24/2016 1525   GFRNONAA 25 (L) 06/09/2017 0402   GFRAA 29 (L) 06/09/2017 0402   Lipase  No results found for: LIPASE     Studies/Results: Ct Head Wo Contrast  Result Date: 06/08/2017 CLINICAL DATA:  Dementia with altered mental status.  Fever. EXAM: CT HEAD WITHOUT CONTRAST TECHNIQUE: Contiguous axial images were obtained from the base of the skull through the vertex without intravenous contrast. COMPARISON:  June 01, 2017 FINDINGS: Brain: Moderately severe diffuse atrophy remains stable. There is no intracranial mass, hemorrhage, extra-axial fluid collection, or  midline shift. There is mild small vessel disease in the centra semiovale bilaterally. Elsewhere gray-white compartments appear normal. No acute infarct evident. Vascular: No hyperdense vessel. There is calcification in each carotid siphon region, more on the left than on the right. Skull: Bony calvarium appears intact. Sinuses/Orbits: Visualized paranasal sinuses are clear. Visualized orbits appear symmetric bilaterally. Other: Mastoid air cells are clear. IMPRESSION: Stable diffuse atrophy. Slight periventricular small vessel disease. No intracranial mass, hemorrhage, or extra-axial fluid collection. No evidence of acute infarct. There are foci of arterial vascular calcification. Electronically Signed   By: Lowella Grip III M.D.   On: 06/08/2017 10:48   Ct Soft Tissue Neck W Contrast  Result Date: 06/08/2017 CLINICAL DATA:  Neck  pain. Patient admitted with perforated appendicitis. EXAM: CT NECK WITH CONTRAST TECHNIQUE: Multidetector CT imaging of the neck was performed using the standard protocol following the bolus administration of intravenous contrast. CONTRAST:  135mL ISOVUE-300 IOPAMIDOL (ISOVUE-300) INJECTION 61% COMPARISON:  None. FINDINGS: Pharynx and larynx: No evidence of mass or swelling. Salivary glands: No inflammation, mass, or stone. Thyroid: Normal. Lymph nodes: None enlarged or abnormal density. Vascular: Moderate calcified plaque at the common carotid bifurcations. Limited intracranial: Atherosclerotic calcification. No acute finding. Visualized orbits: Negative. Mastoids and visualized paranasal sinuses: Negative Skeleton: Head is mildly rotated to the right without subluxation, fracture, or aggressive bone lesion. Generalized cervical disc narrowing and uncovertebral spurring with foraminal stenosis primarily from C3-4 to C6-7. Upper chest: Negative IMPRESSION: 1. No acute or inflammatory finding. 2. Cervical disc degeneration with bilateral foraminal narrowing from C3-4 to C6-7. Electronically Signed   By: Monte Fantasia M.D.   On: 06/08/2017 11:07   Ct Abdomen Pelvis W Contrast  Result Date: 06/08/2017 CLINICAL DATA:  Perforated appendicitis with abscess status post percutaneous drainage, cellulitis, dementia EXAM: CT ABDOMEN AND PELVIS WITH CONTRAST TECHNIQUE: Multidetector CT imaging of the abdomen and pelvis was performed using the standard protocol following bolus administration of intravenous contrast. CONTRAST:  123mL ISOVUE-300 IOPAMIDOL (ISOVUE-300) INJECTION 61% COMPARISON:  05/26/2017 FINDINGS: Lower chest: Small bilateral pleural effusions. Associated bilateral lower lobe opacities, likely atelectasis. Hepatobiliary: Liver is within normal limits. Gallbladder is unremarkable. No intrahepatic or extrahepatic ductal dilatation. Pancreas: Within normal limits. Spleen: Calcified splenic granulomata.  Adrenals/Urinary Tract: Adrenal glands are within normal limits. Kidneys are notable for bilateral renal sinus cysts. No hydronephrosis. Bladder is mildly thick-walled anteriorly although underdistended (series 2/image 82). Stomach/Bowel: Stomach is within normal limits. No evidence of bowel obstruction. Changes related to recent appendicitis with percutaneous drainage of periappendiceal abscess. Indwelling pigtail drainage catheter (series 2/image 57) without residual fluid collection. Sigmoid diverticulosis, without evidence of diverticulitis. Vascular/Lymphatic: No evidence of abdominal aortic aneurysm. No suspicious abdominopelvic lymphadenopathy. Reproductive: Prostate is grossly unremarkable. Other: No abdominopelvic ascites. Moderate right and small left fat containing inguinal hernias. Musculoskeletal: Overlying inflammatory changes/hematoma along the course of the catheter in the right lateral abdominal wall (series 2/image 57). Suspected 4.1 x 1.7 cm intramuscular hematoma (series 14/ image 29). Mild degenerative changes of the lumbar spine. IMPRESSION: Status post percutaneous drainage of periappendiceal abscess related to acute appendicitis. No residual fluid collection in the right lower quadrant. Suspected 4.1 x 1.7 cm intramuscular hematoma along the right lateral abdominal wall, along the course of the pigtail drainage catheter. Overlying inflammatory changes. Small bilateral pleural effusions. Associated bilateral lower lobe opacities, likely atelectasis. Electronically Signed   By: Julian Hy M.D.   On: 06/08/2017 11:06    Anti-infectives: Anti-infectives    Start  Dose/Rate Route Frequency Ordered Stop   06/04/17 1200  vancomycin (VANCOCIN) IVPB 1000 mg/200 mL premix  Status:  Discontinued     1,000 mg 200 mL/hr over 60 Minutes Intravenous Every 12 hours 06/04/17 1115 06/08/17 1409   06/02/17 0000  piperacillin-tazobactam (ZOSYN) IVPB 3.375 g     3.375 g 12.5 mL/hr over 240  Minutes Intravenous Every 8 hours 06/01/17 2103     06/01/17 1745  piperacillin-tazobactam (ZOSYN) IVPB 3.375 g     3.375 g 100 mL/hr over 30 Minutes Intravenous  Once 06/01/17 1730 06/01/17 1839   06/01/17 1730  piperacillin-tazobactam (ZOSYN) IVPB 4.5 g  Status:  Discontinued     4.5 g 200 mL/hr over 30 Minutes Intravenous  Once 06/01/17 1728 06/01/17 1730       Assessment/Plan Dementia, h/o TIB 1998 H/o PE - on Xarelto at home Polycythemia vera  Ruptured appendicitis with abscess s/p per drain 6/27 - multiple organisms present on culture - developed cellulitis around drain site >> on vancomycin/zosyn - drain output low, but output murky.  Would not d/c yet.   - patient is afebrile  ID - zosyn 6/26>>day#9, vancomycin 6/29>>day#6 FEN - regular diet VTE - Xarelto **Main issue today is elevated creatinine.  Suspect acute kidney injury secondary to medication.  Pharmacy has addressed medication dosing already.  Will give additional fluid and check u/a and urine sodium/creatinine.  Probably also related to IV contrast**  Plan - continue antibiotics and drainage.  Diet as tolerated.     LOS: 8 days    [place] [facility] 06/09/2017, 8:42 AM

## 2017-06-09 NOTE — Progress Notes (Signed)
Pharmacy Antibiotic Note  John [name] is a 66 y.o. male presented to the ED on 06/01/2017 with c/o AMS, weakness and poor oral intake.  Abd CT showed ruptured appendicitis with abscess -- perc drain placed by IR on 06/02/17.  Zosyn was started on 6/26 for abscess and vancomycin added on 6/29 for cellulitis around drain site.  Today, 06/09/2017: - day #9 zosyn, day #6 vancomycin - afeb, wbc up 13.4 - scr continues to trend up to 2.54 (crcl~29) -  vancomycin level was supratheraputic at 28 (12 hrs after last dose)--> 23 (24 hrs after last dose)  Plan: -  Continue to hold vancomycin for now.  Check random vancomycin level at 10p tonight (~48 hr after last dose).  Will re-dose if level is <15 - continue zosyn 3.375 gn IV q8h (infuse over 4 hrs) - monitor renal function closely  ____________________________  Height: 5\' 10"  (177.8 cm) Weight: 158 lb 8.2 oz (71.9 kg) IBW/kg (Calculated) : 73  Temp (24hrs), Avg:98.7 F (37.1 C), Min:98.2 F (36.8 C), Max:99.2 F (37.3 C)   Recent Labs Lab 06/05/17 1113  06/06/17 0740 06/06/17 1223 06/07/17 0458 06/07/17 0742 06/08/17 0514 06/08/17 0907 06/08/17 1307 06/08/17 2259 06/09/17 0402  WBC 17.2*  --  12.1*  --   --  13.3*  --  12.9*  --   --  13.4*  CREATININE  --   < >  --   --  0.87 0.88 1.22  --   --  2.42* 2.54*  VANCOTROUGH  --   --   --  12*  --   --   --   --  28*  --   --   VANCORANDOM  --   --   --   --   --   --   --   --   --  23  --   < > = values in this interval not displayed.  Estimated Creatinine Clearance: 29.1 mL/min (A) (by C-G formula based on SCr of 2.54 mg/dL (H)).    No Known Allergies  Antimicrobials this admission: 6/26 Zosyn >>  6/29 Vanc >>   Dose adjustments this admission: 7/1 VT = 12, on 1g q12h, continue same. 7/3 at 1300 VT = 28 (drawn 1 hr late, on 1gm q12h)--> hold 7/3 at 2300 VR = 23 --> hold 7/4 at 2200 VR =     Microbiology results: 6/26 BCx x2: neg FINAL 6/27 abscess cx: mult spp.  None predominant. Holding for anaerobe.  6/27 MRSA PCR: neg  Thank you for allowing pharmacy to be a part of this patient's care.  Lynelle Doctor 06/09/2017 7:54 AM

## 2017-06-09 NOTE — Progress Notes (Signed)
PROGRESS NOTE    John [name]  HEN:[id number] DOB: 11/18/1951 DOA: 06/01/2017 PCP: Cari Caraway, MD   Brief Narrative: John [name] is a 66 y.o. 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 Patient presented with abdominal pain, decrease oral intake, progressive weakness. He is found to have appendicitis with perforation and abscess. General surgery consulted for evaluation. He is s/p percutaneous drainage on 6/27. He has developed cellulitis since then. Wound cultures significant for multiple organisms and he was broadened to vancomycin/Zosyn to cover, in addition to cellulitis. Overall, improving very slowly. Repeat CT planned for 7/3. CT head/neck planned for 7/3. Disposition is likely SNF.   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 S/p percutaneous drain on 06/02/17.  Culture is significant for multiple organisms including gram positive cocci in pairs, gram negative coccobacilli and gram positive rods. -General surgery/IR recommendations -Continue Zosyn/ stop vancomycin due to AKI.  -Continue IV fluids  Appendicitis w/ rupture/abscess Abdominal pain -continue drain per IR -cultures ; multiple organism.  -repeat abdominal CT Status post percutaneous drainage of periappendiceal abscess related to acute appendicitis. No residual fluid collection in the right lower quadrant. Suspected 4.1 x 1.7 cm intramuscular hematoma along the right lateral abdominal wall, along the course of the pigtail drainage catheter.  AKI;  Cr increase to 2. Discontinue vancomycin.  Increase IV fluids.  Renal US; negative for hydronephrosis.  Check UA.    Strict I and O   Cellulitis Over drain site. Improved. -Continue zosyn -discontinue vancomycin due to AKI. Follow WBC>   Somnolence Resolved. Likely secondary to dilaudid  Dementia Patient is currently not agitated/violent -Continue Ativan when necessary  History of PE Patient on Xarelto as an outpatient. This was held on admission. Started on Lovenox VTE prophylaxis dose on admission. S/p perc drain. -hold xarelto due to hematoma and AKI. Repeat hb in am.   Neck pain Unknown cause. Possible torticollis. Difficult in setting of cognitive impairment -CT neck ; Cervical disc degeneration with bilateral foraminal narrowing from C3-4 to C6-7.   DVT prophylaxis: hold Xarelto  due to AKI  Code Status: Full code Family Communication: Wife at bedside Disposition Plan: Discharge pending management of appendicitis, PT recommendations   Consultants:   General surgery  Interventional radiology  Procedures:   Percutaneous drainage (06/02/17)  Antimicrobials:  Zosyn  Vancomycin   Subjective: Non cooperative today.  Sleepy.   Objective: Vitals:   06/07/17 2031 06/08/17 0438 06/08/17 1456 06/08/17 2321  BP: 109/64 112/64 115/73 125/80  Pulse: 66 68 93 70  Resp: 16 16 17 18   Temp: 97.7 F (36.5 C) 98 F (36.7 C) 98.2 F (36.8 C) 99.2 F (37.3 C)  TempSrc: Oral Oral Axillary Axillary  SpO2: 97% 96% 95% 95%  Weight:      Height:        Intake/Output Summary (Last 24 hours) at 06/09/17 1439 Last data filed at 06/09/17 0936  Gross per 24 hour  Intake             3150 ml  Output             2050 ml  Net  1100 ml   Filed Weights   06/01/17 2048 06/02/17 0948  Weight: 75.3 kg (166 lb) 71.9 kg (158 lb 8.2 oz)    Examination:  General exam: No acute distress.  Respiratory system: CTA Cardiovascular system: S 1, S 2 RRR Gastrointestinal system: right side drain in place, mild redness and edema around drain site.  Central nervous system: Somnolent,  not oriented.  Extremities: No edema or leg tenderness     Data Reviewed: I have personally reviewed following labs and imaging studies  CBC:  Recent Labs Lab 06/03/17 0942  06/05/17 1113 06/06/17 0740 06/07/17 0742 06/08/17 0907 06/09/17 0402  WBC 19.8*  < > 17.2* 12.1* 13.3* 12.9* 13.4*  NEUTROABS 16.6*  --   --   --   --   --   --   HGB 14.4  < > 13.9 14.0 14.3 14.9 15.0  HCT 41.2  < > 40.2 40.0 41.4 42.8 43.8  MCV 92.8  < > 93.9 93.2 93.7 93.7 95.2  PLT 252  < > 280 313 365 397 384  < > = values in this interval not displayed. Basic Metabolic Panel:  Recent Labs Lab 06/03/17 0942 06/04/17 0506  06/07/17 0458 06/07/17 0742 06/08/17 0514 06/08/17 2259 06/09/17 0402  NA 137 138  --   --  140  --   --  146*  K 3.6 3.5  --   --  3.4*  --   --  3.7  CL 104 104  --   --  107  --   --  111  CO2 25 24  --   --  24  --   --  25  GLUCOSE 106* 111*  --   --  115*  --   --  117*  BUN 13 13  --   --  6  --   --  15  CREATININE 0.97 0.85  < > 0.87 0.88 1.22 2.42* 2.54*  CALCIUM 8.4* 8.3*  --   --  8.4*  --   --  9.0  < > = values in this interval not displayed. GFR: Estimated Creatinine Clearance: 29.1 mL/min (A) (by C-G formula based on SCr of 2.54 mg/dL (H)). Liver Function Tests: No results for input(s): AST, ALT, ALKPHOS, BILITOT, PROT, ALBUMIN in the last 168 hours. No results for input(s): LIPASE, AMYLASE in the last 168 hours. No results for input(s): AMMONIA in the last 168 hours. Coagulation Profile: No results for input(s): INR, PROTIME in the last 168 hours. Cardiac Enzymes: No results for input(s): CKTOTAL, CKMB, CKMBINDEX, TROPONINI in the last 168 hours. BNP (last 3 results) No results for input(s): PROBNP in the last 8760 hours. HbA1C: No results for input(s): HGBA1C in the last 72 hours. CBG: No results for input(s): GLUCAP in the last 168 hours. Lipid Profile: No results for input(s): CHOL, HDL, LDLCALC, TRIG, CHOLHDL, LDLDIRECT in the last 72  hours. Thyroid Function Tests: No results for input(s): TSH, T4TOTAL, FREET4, T3FREE, THYROIDAB in the last 72 hours. Anemia Panel: No results for input(s): VITAMINB12, FOLATE, FERRITIN, TIBC, IRON, RETICCTPCT in the last 72 hours. Sepsis Labs: No results for input(s): PROCALCITON, LATICACIDVEN in the last 168 hours.  Recent Results (from the past 240 hour(s))  Culture, blood (routine x 2)     Status: None   Collection Time: 06/01/17  3:19 PM  Result Value Ref Range Status   Specimen Description BLOOD RIGHT ANTECUBITAL  Final   Special Requests   Final  BOTTLES DRAWN AEROBIC AND ANAEROBIC Blood Culture adequate volume   Culture   Final    NO GROWTH 5 DAYS Performed at [facility], [place] [address] [place], [place] [address]    Report Status 06/06/2017 FINAL  Final  Culture, blood (routine x 2)     Status: None   Collection Time: 06/01/17  4:04 PM  Result Value Ref Range Status   Specimen Description BLOOD LEFT FOREARM  Final   Special Requests   Final    BOTTLES DRAWN AEROBIC AND ANAEROBIC Blood Culture adequate volume   Culture   Final    NO GROWTH 5 DAYS Performed at [facility], [place] [address] [place], [place] [address]    Report Status 06/06/2017 FINAL  Final  Surgical PCR screen     Status: None   Collection Time: 06/02/17  4:04 PM  Result Value Ref Range Status   MRSA, PCR NEGATIVE NEGATIVE Final   Staphylococcus aureus NEGATIVE NEGATIVE Final    Comment:        The Xpert SA Assay (FDA approved for NASAL specimens in patients over 65 years of age), is one component of a comprehensive surveillance program.  Test performance has been validated by [facility] for patients greater than or equal to 21 year old. It is not intended to diagnose infection nor to guide or monitor treatment.   Aerobic/Anaerobic Culture (surgical/deep wound)     Status: Abnormal   Collection Time: 06/02/17  5:10 PM  Result Value Ref Range Status   Specimen Description  ABSCESS RLQ CT DRAIN  Final   Special Requests NONE  Final   Gram Stain   Final    MODERATE WBC PRESENT, PREDOMINANTLY PMN ABUNDANT GRAM POSITIVE COCCI IN PAIRS ABUNDANT GRAM NEGATIVE COCCOBACILLI FEW GRAM POSITIVE RODS Performed at [facility], [place] [address], [place], [place] [address]    Culture (A)  Final    MULTIPLE ORGANISMS PRESENT, NONE PREDOMINANT MIXED ANAEROBIC FLORA PRESENT.  CALL LAB IF FURTHER IID REQUIRED.    Report Status 06/07/2017 FINAL  Final         Radiology Studies: Ct Head Wo Contrast  Result Date: 06/08/2017 CLINICAL DATA:  Dementia with altered mental status.  Fever. EXAM: CT HEAD WITHOUT CONTRAST TECHNIQUE: Contiguous axial images were obtained from the base of the skull through the vertex without intravenous contrast. COMPARISON:  June 01, 2017 FINDINGS: Brain: Moderately severe diffuse atrophy remains stable. There is no intracranial mass, hemorrhage, extra-axial fluid collection, or midline shift. There is mild small vessel disease in the centra semiovale bilaterally. Elsewhere gray-white compartments appear normal. No acute infarct evident. Vascular: No hyperdense vessel. There is calcification in each carotid siphon region, more on the left than on the right. Skull: Bony calvarium appears intact. Sinuses/Orbits: Visualized paranasal sinuses are clear. Visualized orbits appear symmetric bilaterally. Other: Mastoid air cells are clear. IMPRESSION: Stable diffuse atrophy. Slight periventricular small vessel disease. No intracranial mass, hemorrhage, or extra-axial fluid collection. No evidence of acute infarct. There are foci of arterial vascular calcification. Electronically Signed   By: Lowella Grip III M.D.   On: 06/08/2017 10:48   Ct Soft Tissue Neck W Contrast  Result Date: 06/08/2017 CLINICAL DATA:  Neck pain. Patient admitted with perforated appendicitis. EXAM: CT NECK WITH CONTRAST TECHNIQUE: Multidetector CT imaging of the neck was performed  using the standard protocol following the bolus administration of intravenous contrast. CONTRAST:  170mL ISOVUE-300 IOPAMIDOL (ISOVUE-300) INJECTION 61% COMPARISON:  None.  FINDINGS: Pharynx and larynx: No evidence of mass or swelling. Salivary glands: No inflammation, mass, or stone. Thyroid: Normal. Lymph nodes: None enlarged or abnormal density. Vascular: Moderate calcified plaque at the common carotid bifurcations. Limited intracranial: Atherosclerotic calcification. No acute finding. Visualized orbits: Negative. Mastoids and visualized paranasal sinuses: Negative Skeleton: Head is mildly rotated to the right without subluxation, fracture, or aggressive bone lesion. Generalized cervical disc narrowing and uncovertebral spurring with foraminal stenosis primarily from C3-4 to C6-7. Upper chest: Negative IMPRESSION: 1. No acute or inflammatory finding. 2. Cervical disc degeneration with bilateral foraminal narrowing from C3-4 to C6-7. Electronically Signed   By: Monte Fantasia M.D.   On: 06/08/2017 11:07   Ct Abdomen Pelvis W Contrast  Result Date: 06/08/2017 CLINICAL DATA:  Perforated appendicitis with abscess status post percutaneous drainage, cellulitis, dementia EXAM: CT ABDOMEN AND PELVIS WITH CONTRAST TECHNIQUE: Multidetector CT imaging of the abdomen and pelvis was performed using the standard protocol following bolus administration of intravenous contrast. CONTRAST:  149mL ISOVUE-300 IOPAMIDOL (ISOVUE-300) INJECTION 61% COMPARISON:  05/26/2017 FINDINGS: Lower chest: Small bilateral pleural effusions. Associated bilateral lower lobe opacities, likely atelectasis. Hepatobiliary: Liver is within normal limits. Gallbladder is unremarkable. No intrahepatic or extrahepatic ductal dilatation. Pancreas: Within normal limits. Spleen: Calcified splenic granulomata. Adrenals/Urinary Tract: Adrenal glands are within normal limits. Kidneys are notable for bilateral renal sinus cysts. No hydronephrosis. Bladder is  mildly thick-walled anteriorly although underdistended (series 2/image 82). Stomach/Bowel: Stomach is within normal limits. No evidence of bowel obstruction. Changes related to recent appendicitis with percutaneous drainage of periappendiceal abscess. Indwelling pigtail drainage catheter (series 2/image 57) without residual fluid collection. Sigmoid diverticulosis, without evidence of diverticulitis. Vascular/Lymphatic: No evidence of abdominal aortic aneurysm. No suspicious abdominopelvic lymphadenopathy. Reproductive: Prostate is grossly unremarkable. Other: No abdominopelvic ascites. Moderate right and small left fat containing inguinal hernias. Musculoskeletal: Overlying inflammatory changes/hematoma along the course of the catheter in the right lateral abdominal wall (series 2/image 57). Suspected 4.1 x 1.7 cm intramuscular hematoma (series 14/ image 29). Mild degenerative changes of the lumbar spine. IMPRESSION: Status post percutaneous drainage of periappendiceal abscess related to acute appendicitis. No residual fluid collection in the right lower quadrant. Suspected 4.1 x 1.7 cm intramuscular hematoma along the right lateral abdominal wall, along the course of the pigtail drainage catheter. Overlying inflammatory changes. Small bilateral pleural effusions. Associated bilateral lower lobe opacities, likely atelectasis. Electronically Signed   By: Julian Hy M.D.   On: 06/08/2017 11:06   US Renal  Result Date: 06/09/2017 CLINICAL DATA:  Acute kidney injury EXAM: RENAL / URINARY TRACT ULTRASOUND COMPLETE COMPARISON:  CT 06/08/2017 FINDINGS: Right Kidney: Length: 11.3 cm. Echogenicity within normal limits. No mass or hydronephrosis visualized. Left Kidney: Length: 11.9 cm he. Echogenicity within normal limits. No mass or hydronephrosis visualized. Parapelvic cysts noted as seen on prior CT. Bladder: Appears normal for degree of bladder distention. IMPRESSION: Unremarkable renal ultrasound.  Electronically Signed   By: Rolm Baptise M.D.   On: 06/09/2017 11:39        Scheduled Meds: . famotidine  20 mg Oral Daily  . feeding supplement  1 Container Oral TID BM  . folic acid  1 mg Oral Daily  . magnesium hydroxide  30 mL Oral Daily  . thiamine  100 mg Oral Daily   Continuous Infusions: . dextrose 5 % and 0.45% NaCl 125 mL/hr at 06/09/17 1240  . piperacillin-tazobactam (ZOSYN)  IV 3.375 g (06/09/17 0936)     LOS: 8 days  Elmarie Shiley, MD [facility] 06/09/2017, 2:39 PM Pager: [phone]  If 7PM-7AM, please contact night-coverage [email or web address] Password [facility] 06/09/2017, 2:39 PM

## 2017-06-10 ENCOUNTER — Inpatient Hospital Stay (HOSPITAL_COMMUNITY): Payer: Medicare Other

## 2017-06-10 LAB — BASIC METABOLIC PANEL
ANION GAP: 9 (ref 5–15)
BUN: 15 mg/dL (ref 6–20)
CO2: 27 mmol/L (ref 22–32)
Calcium: 8.7 mg/dL — ABNORMAL LOW (ref 8.9–10.3)
Chloride: 108 mmol/L (ref 101–111)
Creatinine, Ser: 2.23 mg/dL — ABNORMAL HIGH (ref 0.61–1.24)
GFR calc Af Amer: 34 mL/min — ABNORMAL LOW (ref >60)
GFR, EST NON AFRICAN AMERICAN: 29 mL/min — AB (ref >60)
GLUCOSE: 162 mg/dL — AB (ref 65–99)
POTASSIUM: 2.9 mmol/L — AB (ref 3.5–5.1)
Sodium: 144 mmol/L (ref 135–145)

## 2017-06-10 LAB — CBC
HEMATOCRIT: 39 % (ref 39.0–52.0)
HEMOGLOBIN: 13.3 g/dL (ref 13.0–17.0)
MCH: 32.8 pg (ref 26.0–34.0)
MCHC: 34.1 g/dL (ref 30.0–36.0)
MCV: 96.1 fL (ref 78.0–100.0)
Platelets: 384 10*3/uL (ref 150–400)
RBC: 4.06 MIL/uL — ABNORMAL LOW (ref 4.22–5.81)
RDW: 14.2 % (ref 11.5–15.5)
WBC: 10.3 10*3/uL (ref 4.0–10.5)

## 2017-06-10 MED ORDER — ENOXAPARIN SODIUM 40 MG/0.4ML ~~LOC~~ SOLN
40.0000 mg | SUBCUTANEOUS | Status: DC
  Administered 2017-06-10 – 2017-06-11 (×2): 40 mg via SUBCUTANEOUS
  Filled 2017-06-10 (×2): qty 0.4

## 2017-06-10 MED ORDER — IOPAMIDOL (ISOVUE-300) INJECTION 61%
INTRAVENOUS | Status: AC
  Filled 2017-06-10: qty 50

## 2017-06-10 MED ORDER — POTASSIUM CHLORIDE CRYS ER 20 MEQ PO TBCR
40.0000 meq | EXTENDED_RELEASE_TABLET | Freq: Two times a day (BID) | ORAL | Status: AC
  Administered 2017-06-10 (×2): 40 meq via ORAL
  Filled 2017-06-10 (×2): qty 2

## 2017-06-10 NOTE — Progress Notes (Signed)
Patient ID: John [name], male   DOB: 08/12/1951, 66 y.o.   MRN: [id number]  [facility] Surgery Progress Note     Subjective: CC- perforated appendicitis s/p perc drain Wife at bedside. States that patient had a good night. Tolerating small amounts of diet. Had BM yesterday. Drain output low but still purulent. WBC WNL today, afebrile. Creatinine slowly trending down.  Objective: Vital signs in last 24 hours: Temp:  [97 F (36.1 C)-98.9 F (37.2 C)] 97.6 F (36.4 C) (07/05 0636) Pulse Rate:  [54-81] 75 (07/05 0638) Resp:  [14-18] 14 (07/05 0636) BP: (118-148)/(75-78) 139/75 (07/05 0636) SpO2:  [96 %-99 %] 99 % (07/05 0636) Last BM Date: 06/09/17  Intake/Output from previous day: 07/04 0701 - 07/05 0700 In: 3511.3 [I.V.:2351.3; IV Piggyback:1150] Out: 3085 [Urine:3075; Drains:10] Intake/Output this shift: Total I/O In: 46 [IV Piggyback:50] Out: -   PE: Gen: Alert, NAD, confused HEENT: EOM's intact, pupils equal  Pulm: effort normal Cardio: RRR Abd: Soft, ND, mild tenderness around rain, drain output purulent. significant improvement around drain Ext: No erythema, edema, or tenderness BUE/BLE   Lab Results:   Recent Labs  06/09/17 0402 06/10/17 0818  WBC 13.4* 10.3  HGB 15.0 13.3  HCT 43.8 39.0  PLT 384 384   BMET  Recent Labs  06/09/17 0402 06/10/17 0359  NA 146* 144  K 3.7 2.9*  CL 111 108  CO2 25 27  GLUCOSE 117* 162*  BUN 15 15  CREATININE 2.54* 2.23*  CALCIUM 9.0 8.7*   PT/INR No results for input(s): LABPROT, INR in the last 72 hours. CMP     Component Value Date/Time   NA 144 06/10/2017 0359   NA 141 07/24/2016 1525   K 2.9 (L) 06/10/2017 0359   K 4.4 07/24/2016 1525   CL 108 06/10/2017 0359   CO2 27 06/10/2017 0359   CO2 27 07/24/2016 1525   GLUCOSE 162 (H) 06/10/2017 0359   GLUCOSE 95 07/24/2016 1525   BUN 15 06/10/2017 0359   BUN 14.8 07/24/2016 1525   CREATININE 2.23 (H) 06/10/2017 0359   CREATININE 0.9 07/24/2016  1525   CALCIUM 8.7 (L) 06/10/2017 0359   CALCIUM 10.0 07/24/2016 1525   PROT 7.6 06/01/2017 1219   PROT 7.4 07/24/2016 1525   ALBUMIN 3.4 (L) 06/01/2017 1219   ALBUMIN 3.9 07/24/2016 1525   AST 38 06/01/2017 1219   AST 20 07/24/2016 1525   ALT 47 06/01/2017 1219   ALT 17 07/24/2016 1525   ALKPHOS 98 06/01/2017 1219   ALKPHOS 61 07/24/2016 1525   BILITOT 0.9 06/01/2017 1219   BILITOT 0.49 07/24/2016 1525   GFRNONAA 29 (L) 06/10/2017 0359   GFRAA 34 (L) 06/10/2017 0359   Lipase  No results found for: LIPASE     Studies/Results: Ct Head Wo Contrast  Result Date: 06/08/2017 CLINICAL DATA:  Dementia with altered mental status.  Fever. EXAM: CT HEAD WITHOUT CONTRAST TECHNIQUE: Contiguous axial images were obtained from the base of the skull through the vertex without intravenous contrast. COMPARISON:  June 01, 2017 FINDINGS: Brain: Moderately severe diffuse atrophy remains stable. There is no intracranial mass, hemorrhage, extra-axial fluid collection, or midline shift. There is mild small vessel disease in the centra semiovale bilaterally. Elsewhere gray-white compartments appear normal. No acute infarct evident. Vascular: No hyperdense vessel. There is calcification in each carotid siphon region, more on the left than on the right. Skull: Bony calvarium appears intact. Sinuses/Orbits: Visualized paranasal sinuses are clear. Visualized orbits appear symmetric bilaterally.  Other: Mastoid air cells are clear. IMPRESSION: Stable diffuse atrophy. Slight periventricular small vessel disease. No intracranial mass, hemorrhage, or extra-axial fluid collection. No evidence of acute infarct. There are foci of arterial vascular calcification. Electronically Signed   By: Lowella Grip III M.D.   On: 06/08/2017 10:48   Ct Soft Tissue Neck W Contrast  Result Date: 06/08/2017 CLINICAL DATA:  Neck pain. Patient admitted with perforated appendicitis. EXAM: CT NECK WITH CONTRAST TECHNIQUE: Multidetector CT  imaging of the neck was performed using the standard protocol following the bolus administration of intravenous contrast. CONTRAST:  124mL ISOVUE-300 IOPAMIDOL (ISOVUE-300) INJECTION 61% COMPARISON:  None. FINDINGS: Pharynx and larynx: No evidence of mass or swelling. Salivary glands: No inflammation, mass, or stone. Thyroid: Normal. Lymph nodes: None enlarged or abnormal density. Vascular: Moderate calcified plaque at the common carotid bifurcations. Limited intracranial: Atherosclerotic calcification. No acute finding. Visualized orbits: Negative. Mastoids and visualized paranasal sinuses: Negative Skeleton: Head is mildly rotated to the right without subluxation, fracture, or aggressive bone lesion. Generalized cervical disc narrowing and uncovertebral spurring with foraminal stenosis primarily from C3-4 to C6-7. Upper chest: Negative IMPRESSION: 1. No acute or inflammatory finding. 2. Cervical disc degeneration with bilateral foraminal narrowing from C3-4 to C6-7. Electronically Signed   By: Monte Fantasia M.D.   On: 06/08/2017 11:07   Ct Abdomen Pelvis W Contrast  Result Date: 06/08/2017 CLINICAL DATA:  Perforated appendicitis with abscess status post percutaneous drainage, cellulitis, dementia EXAM: CT ABDOMEN AND PELVIS WITH CONTRAST TECHNIQUE: Multidetector CT imaging of the abdomen and pelvis was performed using the standard protocol following bolus administration of intravenous contrast. CONTRAST:  170mL ISOVUE-300 IOPAMIDOL (ISOVUE-300) INJECTION 61% COMPARISON:  05/26/2017 FINDINGS: Lower chest: Small bilateral pleural effusions. Associated bilateral lower lobe opacities, likely atelectasis. Hepatobiliary: Liver is within normal limits. Gallbladder is unremarkable. No intrahepatic or extrahepatic ductal dilatation. Pancreas: Within normal limits. Spleen: Calcified splenic granulomata. Adrenals/Urinary Tract: Adrenal glands are within normal limits. Kidneys are notable for bilateral renal sinus cysts.  No hydronephrosis. Bladder is mildly thick-walled anteriorly although underdistended (series 2/image 82). Stomach/Bowel: Stomach is within normal limits. No evidence of bowel obstruction. Changes related to recent appendicitis with percutaneous drainage of periappendiceal abscess. Indwelling pigtail drainage catheter (series 2/image 57) without residual fluid collection. Sigmoid diverticulosis, without evidence of diverticulitis. Vascular/Lymphatic: No evidence of abdominal aortic aneurysm. No suspicious abdominopelvic lymphadenopathy. Reproductive: Prostate is grossly unremarkable. Other: No abdominopelvic ascites. Moderate right and small left fat containing inguinal hernias. Musculoskeletal: Overlying inflammatory changes/hematoma along the course of the catheter in the right lateral abdominal wall (series 2/image 57). Suspected 4.1 x 1.7 cm intramuscular hematoma (series 14/ image 29). Mild degenerative changes of the lumbar spine. IMPRESSION: Status post percutaneous drainage of periappendiceal abscess related to acute appendicitis. No residual fluid collection in the right lower quadrant. Suspected 4.1 x 1.7 cm intramuscular hematoma along the right lateral abdominal wall, along the course of the pigtail drainage catheter. Overlying inflammatory changes. Small bilateral pleural effusions. Associated bilateral lower lobe opacities, likely atelectasis. Electronically Signed   By: Julian Hy M.D.   On: 06/08/2017 11:06   US Renal  Result Date: 06/09/2017 CLINICAL DATA:  Acute kidney injury EXAM: RENAL / URINARY TRACT ULTRASOUND COMPLETE COMPARISON:  CT 06/08/2017 FINDINGS: Right Kidney: Length: 11.3 cm. Echogenicity within normal limits. No mass or hydronephrosis visualized. Left Kidney: Length: 11.9 cm he. Echogenicity within normal limits. No mass or hydronephrosis visualized. Parapelvic cysts noted as seen on prior CT. Bladder: Appears normal for degree of bladder  distention. IMPRESSION: Unremarkable  renal ultrasound. Electronically Signed   By: Rolm Baptise M.D.   On: 06/09/2017 11:39    Anti-infectives: Anti-infectives    Start     Dose/Rate Route Frequency Ordered Stop   06/04/17 1200  vancomycin (VANCOCIN) IVPB 1000 mg/200 mL premix  Status:  Discontinued     1,000 mg 200 mL/hr over 60 Minutes Intravenous Every 12 hours 06/04/17 1115 06/08/17 1409   06/02/17 0000  piperacillin-tazobactam (ZOSYN) IVPB 3.375 g     3.375 g 12.5 mL/hr over 240 Minutes Intravenous Every 8 hours 06/01/17 2103     06/01/17 1745  piperacillin-tazobactam (ZOSYN) IVPB 3.375 g     3.375 g 100 mL/hr over 30 Minutes Intravenous  Once 06/01/17 1730 06/01/17 1839   06/01/17 1730  piperacillin-tazobactam (ZOSYN) IVPB 4.5 g  Status:  Discontinued     4.5 g 200 mL/hr over 30 Minutes Intravenous  Once 06/01/17 1728 06/01/17 1730       Assessment/Plan Dementia, h/o TIB 1998 H/o PE - on Xarelto at home Polycythemia vera  Ruptured appendicitis with abscess s/p per drain 6/27 - multiple organisms present on culture - developed cellulitis around drain site - drain output low, but output purulent - patient is afebrile, WBC WNL  AKI  - vancomycin stopped 7/3 - Cr slightly down today 2.23 from 2.54 - renal u/s 7/4 unremarkable; good UOP  ID - zosyn 6/26>>day#10, vancomycin 6/29>>7/3 FEN - regular diet VTE - SCDs  Plan - continue antibiotics and drainage.  will discuss with MD when to transition to oral abx. Diet as tolerated.  monitor Cr and UOP. Planning SNF at discharge.   LOS: 9 days    Jerrye Beavers , [facility] 06/10/2017, 9:48 AM Pager: [phone] Consults: [phone] Mon-Fri 7:00 am-4:30 pm Sat-Sun 7:00 am-11:30 am

## 2017-06-10 NOTE — Progress Notes (Signed)
CSW spoke with patient spouse and provided list of bed offers.

## 2017-06-10 NOTE — Progress Notes (Signed)
Physical Therapy Treatment Patient Details Name: John [name] MRN: [id number] DOB: 06/21/1951 Today's Date: 06/10/2017    History of Present Illness KO BARDON is a 66 y.o. 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  Probable ruptured appendix with abcess. Post drain placed.                   PT Comments    Pt remains confused and easily agitated.  Still requires + 2 total  assist for all mobility.    Follow Up Recommendations  SNF (spouse was hoping to take pt home but mobility progress is impaired by cognition)     Equipment Recommendations       Recommendations for Other Services       Precautions / Restrictions Precautions Precautions: Fall Precaution Comments: Hx TBI/ETOH easily agitated, right drain from abdomen, does better with family present Restrictions Weight Bearing Restrictions: No    Mobility  Bed Mobility Overal bed mobility: Needs Assistance Bed Mobility: Supine to Sit     Supine to sit: +2 for physical assistance;+2 for safety/equipment;HOB elevated;Total assist     General bed mobility comments: much encouragement and + 2 assist to transfer from supine to EOB.  Resistant.  Pushing.  Unable to follow simple commands.   Transfers   Equipment used: 2 person hand held assist Transfers: Sit to/from [facility] Sit to Stand: +2 physical assistance;+2 safety/equipment;Total assist Stand pivot transfers: Total assist;+2 physical assistance;+2 safety/equipment       General transfer comment: + 2 side by side assist MAX physical Assist to support pt upright.  Severe R lean.  Poor self correction to midline.  Required assist to sit as pt resisted.  Also assisted  on/off BSC  Ambulation/Gait Ambulation/Gait assistance: Total assist;+2 physical assistance;+2 safety/equipment Ambulation Distance (Feet): 22 Feet Assistive device: 2 person hand held assist Gait Pattern/deviations: Step-to pattern;Step-through pattern;Decreased step length - right;Decreased step length - left;Decreased weight shift to left;Ataxic;Festinating;Scissoring;Staggering right     General Gait Details: attempted B platform walker however unsuccessful due to poor cognition.  Pt required + 2 side by assist with much assist to progress gait.  Poor stepping.  Resistant.  Had spouse in front to attempt a more upright stance and encouragement.     Stairs            Wheelchair Mobility    Modified Rankin (Stroke Patients Only)       Balance                                            Cognition Arousal/Alertness: Awake/alert Behavior During Therapy: Agitated;Anxious Overall Cognitive Status: Impaired/Different from baseline Area of Impairment: Following commands;Safety/judgement;Orientation;Attention;Memory;Problem solving;Awareness                               General Comments: more arroused but unable to follow commands.  Responds to name.  Brief eye contact.  Mixed random words expressive      Exercises      General Comments        Pertinent Vitals/Pain Pain  Assessment: No/denies pain    Home Living                      Prior Function            PT Goals (current goals can now be found in the care plan section) Progress towards PT goals: Progressing toward goals    Frequency    Min 3X/week      PT Plan Current plan remains appropriate    Co-evaluation              AM-PAC PT "6 Clicks" Daily Activity  Outcome Measure  Difficulty turning over in bed (including adjusting bedclothes, sheets and blankets)?: Total Difficulty moving from lying on back to sitting on the side of the bed? :  Total Difficulty sitting down on and standing up from a chair with arms (e.g., wheelchair, bedside commode, etc,.)?: Total Help needed moving to and from a bed to chair (including a wheelchair)?: Total Help needed walking in hospital room?: Total Help needed climbing 3-5 steps with a railing? : Total 6 Click Score: 6    End of Session Equipment Utilized During Treatment: Gait belt Activity Tolerance: Other (comment) (impaired cognition) Patient left: in chair;with chair alarm set;with family/visitor present;with call bell/phone within reach Nurse Communication: Mobility status PT Visit Diagnosis: Unsteadiness on feet (R26.81)     Time: 1410-1435 PT Time Calculation (min) (ACUTE ONLY): 25 min  Charges:  $Gait Training: 8-22 mins $Therapeutic Activity: 8-22 mins                    G Codes:       Rica Koyanagi  PTA [facility]  Acute  Rehab Pager      [phone]

## 2017-06-10 NOTE — Progress Notes (Signed)
PROGRESS NOTE    John [name]  XTG:[id number] DOB: February 27, 1951 DOA: 06/01/2017 PCP: Cari Caraway, MD   Brief Narrative: John [name] is a 66 y.o. 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 Patient presented with abdominal pain, decrease oral intake, progressive weakness. He is found to have appendicitis with perforation and abscess. General surgery consulted for evaluation. He is s/p percutaneous drainage on 6/27. He has developed cellulitis since then. Wound cultures significant for multiple organisms and he was broadened to vancomycin/Zosyn to cover, in addition to cellulitis. Overall, improving very slowly. Repeat CT planned for 7/3. CT head/neck planned for 7/3. Disposition is likely SNF.   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)   Ruptured appendix   Abscess of appendix   Agitation   Sepsis Present on admission and secondary to appendicitis with rupture and abscess. S/p percutaneous drain on 06/02/17.  Culture is significant for multiple organisms including gram positive cocci in pairs, gram negative coccobacilli and gram positive rods. -General surgery/IR recommendations -Continue Zosyn/ stop vancomycin due to AKI.  -Continue IV fluids -WBC trending down.   Appendicitis w/ rupture/abscess Abdominal pain -continue drain per IR -cultures ; multiple organism.  -repeat abdominal CT Status post percutaneous drainage of periappendiceal abscess related to acute appendicitis. No residual fluid collection in the right lower quadrant. Suspected 4.1 x 1.7 cm intramuscular hematoma along the right lateral abdominal wall, along the course of the pigtail drainage catheter. Plan is to continue IV antibiotics for now. Follow Sx recommendation   AKI;  Cr increase to 2.  Discontinue vancomycin.  Continue with  IV fluids.  Renal US; negative for hydronephrosis.  Improving, slight decreased cr 2.2  Cellulitis Over drain site. Improved. -Continue zosyn -discontinue vancomycin due to AKI. Follow WBC>   Somnolence More alert today   Dementia Patient is currently not agitated/violent -Continue Ativan when necessary  History of PE 2016 Patient on Xarelto as an outpatient. This was held on admission. Started on Lovenox VTE prophylaxis dose on admission. S/p perc drain. -hold xarelto due to hematoma and AKI. Will order DVT prophylaxis, discussed with Sx.   Neck pain Unknown cause. Possible torticollis. Difficult in setting of cognitive impairment -CT neck ; Cervical disc degeneration with bilateral foraminal narrowing from C3-4 to C6-7.   DVT prophylaxis: hold Xarelto  due to AKI  Code Status: Full code Family Communication: none at bedside.  Disposition Plan: Discharge pending management of appendicitis, PT recommendations   Consultants:   General surgery  Interventional radiology  Procedures:   Percutaneous drainage (06/02/17)  Antimicrobials:  Zosyn  Vancomycin   Subjective: More alert today.  Confuse.  Denies pain   Objective: Vitals:   06/09/17 1330 06/09/17 2123 06/10/17 0636 06/10/17 0638  BP: 118/77 (!) 148/78 139/75   Pulse: 81 66 (!) 54 75  Resp: 18 18 14    Temp: 98.9 F (37.2 C) (!) 97 F (36.1 C) 97.6 F (36.4 C)   TempSrc: Axillary Axillary Oral   SpO2: 96% 98% 99%   Weight:      Height:        Intake/Output Summary (Last 24 hours) at 06/10/17 1239 Last data filed at 06/10/17 0952  Gross per 24 hour  Intake          2321.25 ml  Output  2610 ml  Net          -288.75 ml   Filed Weights   06/01/17 2048 06/02/17 0948  Weight: 75.3 kg (166 lb) 71.9 kg (158 lb 8.2 oz)    Examination:  General exam: NAD Respiratory system: CTA Cardiovascular system: S 1, S 2 RRR Gastrointestinal system:  right side drain in place, dressing abdomen wall. Central nervous system: alert, confuse.  Extremities: No edema or leg tenderness     Data Reviewed: I have personally reviewed following labs and imaging studies  CBC:  Recent Labs Lab 06/06/17 0740 06/07/17 0742 06/08/17 0907 06/09/17 0402 06/10/17 0818  WBC 12.1* 13.3* 12.9* 13.4* 10.3  HGB 14.0 14.3 14.9 15.0 13.3  HCT 40.0 41.4 42.8 43.8 39.0  MCV 93.2 93.7 93.7 95.2 96.1  PLT 313 365 397 384 814   Basic Metabolic Panel:  Recent Labs Lab 06/04/17 0506  06/07/17 0742 06/08/17 0514 06/08/17 2259 06/09/17 0402 06/10/17 0359  NA 138  --  140  --   --  146* 144  K 3.5  --  3.4*  --   --  3.7 2.9*  CL 104  --  107  --   --  111 108  CO2 24  --  24  --   --  25 27  GLUCOSE 111*  --  115*  --   --  117* 162*  BUN 13  --  6  --   --  15 15  CREATININE 0.85  < > 0.88 1.22 2.42* 2.54* 2.23*  CALCIUM 8.3*  --  8.4*  --   --  9.0 8.7*  < > = values in this interval not displayed. GFR: Estimated Creatinine Clearance: 33.1 mL/min (A) (by C-G formula based on SCr of 2.23 mg/dL (H)). Liver Function Tests: No results for input(s): AST, ALT, ALKPHOS, BILITOT, PROT, ALBUMIN in the last 168 hours. No results for input(s): LIPASE, AMYLASE in the last 168 hours. No results for input(s): AMMONIA in the last 168 hours. Coagulation Profile: No results for input(s): INR, PROTIME in the last 168 hours. Cardiac Enzymes: No results for input(s): CKTOTAL, CKMB, CKMBINDEX, TROPONINI in the last 168 hours. BNP (last 3 results) No results for input(s): PROBNP in the last 8760 hours. HbA1C: No results for input(s): HGBA1C in the last 72 hours. CBG: No results for input(s): GLUCAP in the last 168 hours. Lipid Profile: No results for input(s): CHOL, HDL, LDLCALC, TRIG, CHOLHDL, LDLDIRECT in the last 72 hours. Thyroid Function Tests: No results for input(s): TSH, T4TOTAL, FREET4, T3FREE, THYROIDAB in the last 72 hours. Anemia Panel: No  results for input(s): VITAMINB12, FOLATE, FERRITIN, TIBC, IRON, RETICCTPCT in the last 72 hours. Sepsis Labs: No results for input(s): PROCALCITON, LATICACIDVEN in the last 168 hours.  Recent Results (from the past 240 hour(s))  Culture, blood (routine x 2)     Status: None   Collection Time: 06/01/17  3:19 PM  Result Value Ref Range Status   Specimen Description BLOOD RIGHT ANTECUBITAL  Final   Special Requests   Final    BOTTLES DRAWN AEROBIC AND ANAEROBIC Blood Culture adequate volume   Culture   Final    NO GROWTH 5 DAYS Performed at [facility], [address], [place], [place] [address]    Report Status 06/06/2017 FINAL  Final  Culture, blood (routine x 2)     Status: None   Collection Time: 06/01/17  4:04 PM  Result Value Ref Range Status  Specimen Description BLOOD LEFT FOREARM  Final   Special Requests   Final    BOTTLES DRAWN AEROBIC AND ANAEROBIC Blood Culture adequate volume   Culture   Final    NO GROWTH 5 DAYS Performed at [facility], [address], [place], [place] [address]    Report Status 06/06/2017 FINAL  Final  Surgical PCR screen     Status: None   Collection Time: 06/02/17  4:04 PM  Result Value Ref Range Status   MRSA, PCR NEGATIVE NEGATIVE Final   Staphylococcus aureus NEGATIVE NEGATIVE Final    Comment:        The Xpert SA Assay (FDA approved for NASAL specimens in patients over 70 years of age), is one component of a comprehensive surveillance program.  Test performance has been validated by [facility] for patients greater than or equal to 13 year old. It is not intended to diagnose infection nor to guide or monitor treatment.   Aerobic/Anaerobic Culture (surgical/deep wound)     Status: Abnormal   Collection Time: 06/02/17  5:10 PM  Result Value Ref Range Status   Specimen Description ABSCESS RLQ CT DRAIN  Final   Special Requests NONE  Final   Gram Stain   Final    MODERATE WBC PRESENT, PREDOMINANTLY PMN ABUNDANT GRAM  POSITIVE COCCI IN PAIRS ABUNDANT GRAM NEGATIVE COCCOBACILLI FEW GRAM POSITIVE RODS Performed at [facility], [place] [address] [place], [place] [address]    Culture (A)  Final    MULTIPLE ORGANISMS PRESENT, NONE PREDOMINANT MIXED ANAEROBIC FLORA PRESENT.  CALL LAB IF FURTHER IID REQUIRED.    Report Status 06/07/2017 FINAL  Final         Radiology Studies: US Renal  Result Date: 06/09/2017 CLINICAL DATA:  Acute kidney injury EXAM: RENAL / URINARY TRACT ULTRASOUND COMPLETE COMPARISON:  CT 06/08/2017 FINDINGS: Right Kidney: Length: 11.3 cm. Echogenicity within normal limits. No mass or hydronephrosis visualized. Left Kidney: Length: 11.9 cm he. Echogenicity within normal limits. No mass or hydronephrosis visualized. Parapelvic cysts noted as seen on prior CT. Bladder: Appears normal for degree of bladder distention. IMPRESSION: Unremarkable renal ultrasound. Electronically Signed   By: Rolm Baptise M.D.   On: 06/09/2017 11:39        Scheduled Meds: . famotidine  20 mg Oral Daily  . feeding supplement  1 Container Oral TID BM  . folic acid  1 mg Oral Daily  . iopamidol      . potassium chloride  40 mEq Oral BID  . thiamine  100 mg Oral Daily   Continuous Infusions: . dextrose 5 % and 0.45% NaCl 100 mL/hr at 06/10/17 0757  . piperacillin-tazobactam (ZOSYN)  IV 3.375 g (06/10/17 0757)     LOS: 9 days     Jazae Gandolfi, Cassie Freer, MD [facility] 06/10/2017, 12:39 PM Pager: [phone]  If 7PM-7AM, please contact night-coverage [email or web address] Password [facility] 06/10/2017, 12:39 PM

## 2017-06-10 NOTE — Progress Notes (Signed)
ANTICOAGULATION CONSULT NOTE - Initial Consult  Pharmacy Consult for LMWH Indication: VTE prophylaxis  No Known Allergies  Patient Measurements: Height: 5\' 10"  (177.8 cm) Weight: 158 lb 8.2 oz (71.9 kg) IBW/kg (Calculated) : 73   Vital Signs: Temp: 97.6 F (36.4 C) (07/05 0636) Temp Source: Oral (07/05 0636) BP: 139/75 (07/05 0636) Pulse Rate: 75 (07/05 0638)  Labs:  Recent Labs  06/08/17 0907 06/08/17 2259 06/09/17 0402 06/10/17 0359 06/10/17 0818  HGB 14.9  --  15.0  --  13.3  HCT 42.8  --  43.8  --  39.0  PLT 397  --  384  --  384  CREATININE  --  2.42* 2.54* 2.23*  --     Estimated Creatinine Clearance: 33.1 mL/min (A) (by C-G formula based on SCr of 2.23 mg/dL (H)).   Medical History: Past Medical History:  Diagnosis Date  . Adenomatous colon polyp   . Alcohol dependence ([place])   . Ataxia 07/05/2013  . Conjunctivitis, allergic   . 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 Hyperlipemia   . Hypertension   . Memory loss    dementia was in question in 2004  . Peyronie's disease    presumptive,resolved  . 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 Vitamin B 12 deficiency      Assessment: 66 yo M on Xarelto PTA for h/o PE with extensive RLE DVT in 2016, LD 6/24 PTA Xarelto was held for IR drain placement, drain placed 6/27, was on ppx Lovenox, last dose 6/28. 6/29: Xarelto 20 daily 7/3 abd CT: suspected intramuscular hematoma along the right lateral abdominal wall, along the course of the pigtail drainage catheter 7/4: Dr. Tyrell Antonio said to d/c xarelto for now d/t hematoma.  7/5:LMWH for VTE px, OK w/ CCS, pt on regular diet  Goal of Therapy: antiXa level 0.3 - 0.6 drawn 4 hrs after LMWH dose given Monitor platelets by anticoagulation protocol: Yes   Plan:  LMWH 40 mg sq q24 F/u renal fxn and CBC F/u to resume home xarelto once AKI resolves and OK w/ CCS for abd wall hematoma  Eudelia Bunch, Pharm.D. [phone] 06/10/2017 1:02 PM

## 2017-06-10 NOTE — Progress Notes (Signed)
Referring Physician(s): Rosenbower,T  Supervising Physician: Jacqulynn Cadet  Patient Status:  [facility] - In-pt  Chief Complaint:  Appendiceal abscess  Subjective: Pt resting quietly; no family in room; afebrile  Allergies: Patient has no known allergies.  Medications: Prior to Admission medications   Medication Sig Start Date End Date Taking? Authorizing Provider  B COMPLEX VITAMINS PO Take by mouth 3 (three) times daily.   Yes [provider]  folic acid (FOLVITE) 1 MG tablet Take 1 tablet (1 mg total) by mouth daily. 06/25/15  Yes Thurnell Lose, MD  loratadine (CLARITIN) 10 MG tablet Take 10 mg by mouth daily.   Yes [provider]  rivaroxaban (XARELTO) 20 MG TABS tablet Take 1 tablet (20 mg total) by mouth daily with supper. 09/18/16  Yes Gorsuch, Ni, MD  thiamine 100 MG tablet Take 1 tablet (100 mg total) by mouth daily. 06/25/15  Yes Thurnell Lose, MD  vitamin B-12 (CYANOCOBALAMIN) 1000 MCG tablet Take 1,000 mcg by mouth daily.   Yes [provider]     Vital Signs: BP 139/75 (BP Location: Left Arm)   Pulse 75   Temp 97.6 F (36.4 C) (Oral)   Resp 14   Ht 5\' 10"  (1.778 m)   Wt 158 lb 8.2 oz (71.9 kg)   SpO2 99%   BMI 22.74 kg/m   Physical Exam rt abd drain intact, binder currently in place; small amt tan colored fluid in bag  Imaging: Ct Head Wo Contrast  Result Date: 06/08/2017 CLINICAL DATA:  Dementia with altered mental status.  Fever. EXAM: CT HEAD WITHOUT CONTRAST TECHNIQUE: Contiguous axial images were obtained from the base of the skull through the vertex without intravenous contrast. COMPARISON:  June 01, 2017 FINDINGS: Brain: Moderately severe diffuse atrophy remains stable. There is no intracranial mass, hemorrhage, extra-axial fluid collection, or midline shift. There is mild small vessel disease in the centra semiovale bilaterally. Elsewhere gray-white compartments appear normal. No acute infarct evident. Vascular: No  hyperdense vessel. There is calcification in each carotid siphon region, more on the left than on the right. Skull: Bony calvarium appears intact. Sinuses/Orbits: Visualized paranasal sinuses are clear. Visualized orbits appear symmetric bilaterally. Other: Mastoid air cells are clear. IMPRESSION: Stable diffuse atrophy. Slight periventricular small vessel disease. No intracranial mass, hemorrhage, or extra-axial fluid collection. No evidence of acute infarct. There are foci of arterial vascular calcification. Electronically Signed   By: Lowella Grip III M.D.   On: 06/08/2017 10:48   Ct Soft Tissue Neck W Contrast  Result Date: 06/08/2017 CLINICAL DATA:  Neck pain. Patient admitted with perforated appendicitis. EXAM: CT NECK WITH CONTRAST TECHNIQUE: Multidetector CT imaging of the neck was performed using the standard protocol following the bolus administration of intravenous contrast. CONTRAST:  156mL ISOVUE-300 IOPAMIDOL (ISOVUE-300) INJECTION 61% COMPARISON:  None. FINDINGS: Pharynx and larynx: No evidence of mass or swelling. Salivary glands: No inflammation, mass, or stone. Thyroid: Normal. Lymph nodes: None enlarged or abnormal density. Vascular: Moderate calcified plaque at the common carotid bifurcations. Limited intracranial: Atherosclerotic calcification. No acute finding. Visualized orbits: Negative. Mastoids and visualized paranasal sinuses: Negative Skeleton: Head is mildly rotated to the right without subluxation, fracture, or aggressive bone lesion. Generalized cervical disc narrowing and uncovertebral spurring with foraminal stenosis primarily from C3-4 to C6-7. Upper chest: Negative IMPRESSION: 1. No acute or inflammatory finding. 2. Cervical disc degeneration with bilateral foraminal narrowing from C3-4 to C6-7. Electronically Signed   By: Monte Fantasia M.D.   On:  06/08/2017 11:07   Ct Abdomen Pelvis W Contrast  Result Date: 06/08/2017 CLINICAL DATA:  Perforated appendicitis with  abscess status post percutaneous drainage, cellulitis, dementia EXAM: CT ABDOMEN AND PELVIS WITH CONTRAST TECHNIQUE: Multidetector CT imaging of the abdomen and pelvis was performed using the standard protocol following bolus administration of intravenous contrast. CONTRAST:  156mL ISOVUE-300 IOPAMIDOL (ISOVUE-300) INJECTION 61% COMPARISON:  05/26/2017 FINDINGS: Lower chest: Small bilateral pleural effusions. Associated bilateral lower lobe opacities, likely atelectasis. Hepatobiliary: Liver is within normal limits. Gallbladder is unremarkable. No intrahepatic or extrahepatic ductal dilatation. Pancreas: Within normal limits. Spleen: Calcified splenic granulomata. Adrenals/Urinary Tract: Adrenal glands are within normal limits. Kidneys are notable for bilateral renal sinus cysts. No hydronephrosis. Bladder is mildly thick-walled anteriorly although underdistended (series 2/image 82). Stomach/Bowel: Stomach is within normal limits. No evidence of bowel obstruction. Changes related to recent appendicitis with percutaneous drainage of periappendiceal abscess. Indwelling pigtail drainage catheter (series 2/image 57) without residual fluid collection. Sigmoid diverticulosis, without evidence of diverticulitis. Vascular/Lymphatic: No evidence of abdominal aortic aneurysm. No suspicious abdominopelvic lymphadenopathy. Reproductive: Prostate is grossly unremarkable. Other: No abdominopelvic ascites. Moderate right and small left fat containing inguinal hernias. Musculoskeletal: Overlying inflammatory changes/hematoma along the course of the catheter in the right lateral abdominal wall (series 2/image 57). Suspected 4.1 x 1.7 cm intramuscular hematoma (series 14/ image 29). Mild degenerative changes of the lumbar spine. IMPRESSION: Status post percutaneous drainage of periappendiceal abscess related to acute appendicitis. No residual fluid collection in the right lower quadrant. Suspected 4.1 x 1.7 cm intramuscular hematoma  along the right lateral abdominal wall, along the course of the pigtail drainage catheter. Overlying inflammatory changes. Small bilateral pleural effusions. Associated bilateral lower lobe opacities, likely atelectasis. Electronically Signed   By: Julian Hy M.D.   On: 06/08/2017 11:06   US Renal  Result Date: 06/09/2017 CLINICAL DATA:  Acute kidney injury EXAM: RENAL / URINARY TRACT ULTRASOUND COMPLETE COMPARISON:  CT 06/08/2017 FINDINGS: Right Kidney: Length: 11.3 cm. Echogenicity within normal limits. No mass or hydronephrosis visualized. Left Kidney: Length: 11.9 cm he. Echogenicity within normal limits. No mass or hydronephrosis visualized. Parapelvic cysts noted as seen on prior CT. Bladder: Appears normal for degree of bladder distention. IMPRESSION: Unremarkable renal ultrasound. Electronically Signed   By: Rolm Baptise M.D.   On: 06/09/2017 11:39    Labs:  CBC:  Recent Labs  06/07/17 0742 06/08/17 0907 06/09/17 0402 06/10/17 0818  WBC 13.3* 12.9* 13.4* 10.3  HGB 14.3 14.9 15.0 13.3  HCT 41.4 42.8 43.8 39.0  PLT 365 397 384 384    COAGS:  Recent Labs  06/01/17 2147  INR 1.40    BMP:  Recent Labs  06/04/17 0506  06/07/17 0742 06/08/17 0514 06/08/17 2259 06/09/17 0402 06/10/17 0359  NA 138  --  140  --   --  146* 144  K 3.5  --  3.4*  --   --  3.7 2.9*  CL 104  --  107  --   --  111 108  CO2 24  --  24  --   --  25 27  GLUCOSE 111*  --  115*  --   --  117* 162*  BUN 13  --  6  --   --  15 15  CALCIUM 8.3*  --  8.4*  --   --  9.0 8.7*  CREATININE 0.85  < > 0.88 1.22 2.42* 2.54* 2.23*  GFRNONAA >60  < > >60 >60 26* 25* 29*  GFRAA >60  < > >60 >60 30* 29* 34*  < > = values in this interval not displayed.  LIVER FUNCTION TESTS:  Recent Labs  07/24/16 1525 06/01/17 1219  BILITOT 0.49 0.9  AST 20 38  ALT 17 47  ALKPHOS 61 98  PROT 7.4 7.6  ALBUMIN 3.9 3.4*    Assessment and Plan: Appendiceal abscess, status post drainage on 06/02/17; AF; WBC  nl; creat 2.23(2.54), K 2.9- replace; last CT 7/3 revealed no residual fluid collection in the right lower quadrant.Suspected 4.1 x 1.7 cm intramuscular hematoma along the right lateral abdominal wall, along the course of the pigtail drainagecatheter. Overlying inflammatory changes.Small bilateral pleural effusions. Associated bilateral lower lobe opacities, likely atelectasis. For now continue drain as per CCS (output low but turbid tan colored fluid present).   Electronically Signed: D. Rowe Robert, PA-C 06/10/2017, 1:42 PM   I spent a total of 15 minutes  at the the patient's bedside AND on the patient's hospital floor or unit, greater than 50% of which was counseling/coordinating care for appendiceal abscess drain    Patient ID: John [name], male   DOB: [birth year], 66 y.o.   MRN: [id number]

## 2017-06-11 LAB — CBC
HCT: 41.4 % (ref 39.0–52.0)
Hemoglobin: 13.8 g/dL (ref 13.0–17.0)
MCH: 32.2 pg (ref 26.0–34.0)
MCHC: 33.3 g/dL (ref 30.0–36.0)
MCV: 96.7 fL (ref 78.0–100.0)
PLATELETS: 373 10*3/uL (ref 150–400)
RBC: 4.28 MIL/uL (ref 4.22–5.81)
RDW: 14.4 % (ref 11.5–15.5)
WBC: 10.1 10*3/uL (ref 4.0–10.5)

## 2017-06-11 LAB — BASIC METABOLIC PANEL
Anion gap: 8 (ref 5–15)
BUN: 13 mg/dL (ref 6–20)
CALCIUM: 8.7 mg/dL — AB (ref 8.9–10.3)
CO2: 27 mmol/L (ref 22–32)
CREATININE: 1.89 mg/dL — AB (ref 0.61–1.24)
Chloride: 109 mmol/L (ref 101–111)
GFR calc Af Amer: 41 mL/min — ABNORMAL LOW (ref >60)
GFR, EST NON AFRICAN AMERICAN: 35 mL/min — AB (ref >60)
Glucose, Bld: 141 mg/dL — ABNORMAL HIGH (ref 65–99)
POTASSIUM: 3.3 mmol/L — AB (ref 3.5–5.1)
SODIUM: 144 mmol/L (ref 135–145)

## 2017-06-11 MED ORDER — TRAMADOL HCL 50 MG PO TABS
50.0000 mg | ORAL_TABLET | Freq: Four times a day (QID) | ORAL | Status: DC | PRN
Start: 2017-06-11 — End: 2017-06-14

## 2017-06-11 MED ORDER — POTASSIUM CHLORIDE CRYS ER 20 MEQ PO TBCR
40.0000 meq | EXTENDED_RELEASE_TABLET | Freq: Once | ORAL | Status: AC
  Administered 2017-06-11: 40 meq via ORAL
  Filled 2017-06-11: qty 2

## 2017-06-11 MED ORDER — MORPHINE SULFATE (PF) 4 MG/ML IV SOLN: 1.0000 mg | INTRAVENOUS | Status: DC | PRN

## 2017-06-11 NOTE — Progress Notes (Signed)
Patient ID: John [name], male   DOB: 01-Dec-1951, 66 y.o.   MRN: [id number]  [facility] Surgery Progress Note     Subjective: CC- perforated appendicitis s/p perc drain Patient sitting up in bed, no family at bedside. States that he has no abdominal pain. He has had no n/v. Taking in small amounts of diet. WBC is again WNL and patient is afebrile. Creatinine trending down (1.89).  Objective: Vital signs in last 24 hours: Temp:  [97.8 F (36.6 C)-98.7 F (37.1 C)] 98.7 F (37.1 C) (07/06 0515) Pulse Rate:  [63-67] 65 (07/06 0515) Resp:  [16-18] 18 (07/06 0515) BP: (111-112)/(65-78) 111/65 (07/06 0515) SpO2:  [96 %-98 %] 96 % (07/06 0515) Last BM Date: 06/09/17  Intake/Output from previous day: 07/05 0701 - 07/06 0700 In: 2623.3 [P.O.:90; I.V.:2383.3; IV Piggyback:150] Out: 815 [Urine:800; Drains:15] Intake/Output this shift: No intake/output data recorded.  PE: Gen: Alert, NAD, cooperative HEENT: EOM's intact, pupils equal  Pulm: effort normal Cardio: RRR Abd: Soft, ND, mild tenderness around drain, no output in bag (per RN when it was emptied the fluid was turbid/grey). significant improvement in cellulitis surrounding drain Ext: No erythema, edema, or tenderness BUE/BLE  Lab Results:   Recent Labs  06/10/17 0818 06/11/17 0421  WBC 10.3 10.1  HGB 13.3 13.8  HCT 39.0 41.4  PLT 384 373   BMET  Recent Labs  06/10/17 0359 06/11/17 0421  NA 144 144  K 2.9* 3.3*  CL 108 109  CO2 27 27  GLUCOSE 162* 141*  BUN 15 13  CREATININE 2.23* 1.89*  CALCIUM 8.7* 8.7*   PT/INR No results for input(s): LABPROT, INR in the last 72 hours. CMP     Component Value Date/Time   NA 144 06/11/2017 0421   NA 141 07/24/2016 1525   K 3.3 (L) 06/11/2017 0421   K 4.4 07/24/2016 1525   CL 109 06/11/2017 0421   CO2 27 06/11/2017 0421   CO2 27 07/24/2016 1525   GLUCOSE 141 (H) 06/11/2017 0421   GLUCOSE 95 07/24/2016 1525   BUN 13 06/11/2017 0421   BUN 14.8  07/24/2016 1525   CREATININE 1.89 (H) 06/11/2017 0421   CREATININE 0.9 07/24/2016 1525   CALCIUM 8.7 (L) 06/11/2017 0421   CALCIUM 10.0 07/24/2016 1525   PROT 7.6 06/01/2017 1219   PROT 7.4 07/24/2016 1525   ALBUMIN 3.4 (L) 06/01/2017 1219   ALBUMIN 3.9 07/24/2016 1525   AST 38 06/01/2017 1219   AST 20 07/24/2016 1525   ALT 47 06/01/2017 1219   ALT 17 07/24/2016 1525   ALKPHOS 98 06/01/2017 1219   ALKPHOS 61 07/24/2016 1525   BILITOT 0.9 06/01/2017 1219   BILITOT 0.49 07/24/2016 1525   GFRNONAA 35 (L) 06/11/2017 0421   GFRAA 41 (L) 06/11/2017 0421   Lipase  No results found for: LIPASE     Studies/Results: US Renal  Result Date: 06/09/2017 CLINICAL DATA:  Acute kidney injury EXAM: RENAL / URINARY TRACT ULTRASOUND COMPLETE COMPARISON:  CT 06/08/2017 FINDINGS: Right Kidney: Length: 11.3 cm. Echogenicity within normal limits. No mass or hydronephrosis visualized. Left Kidney: Length: 11.9 cm he. Echogenicity within normal limits. No mass or hydronephrosis visualized. Parapelvic cysts noted as seen on prior CT. Bladder: Appears normal for degree of bladder distention. IMPRESSION: Unremarkable renal ultrasound. Electronically Signed   By: Rolm Baptise M.D.   On: 06/09/2017 11:39    Anti-infectives: Anti-infectives    Start     Dose/Rate Route Frequency Ordered Stop  06/04/17 1200  vancomycin (VANCOCIN) IVPB 1000 mg/200 mL premix  Status:  Discontinued     1,000 mg 200 mL/hr over 60 Minutes Intravenous Every 12 hours 06/04/17 1115 06/08/17 1409   06/02/17 0000  piperacillin-tazobactam (ZOSYN) IVPB 3.375 g     3.375 g 12.5 mL/hr over 240 Minutes Intravenous Every 8 hours 06/01/17 2103     06/01/17 1745  piperacillin-tazobactam (ZOSYN) IVPB 3.375 g     3.375 g 100 mL/hr over 30 Minutes Intravenous  Once 06/01/17 1730 06/01/17 1839   06/01/17 1730  piperacillin-tazobactam (ZOSYN) IVPB 4.5 g  Status:  Discontinued     4.5 g 200 mL/hr over 30 Minutes Intravenous  Once 06/01/17 1728  06/01/17 1730       Assessment/Plan Dementia, h/o TIB 1998 H/o PE - on Xarelto at home Polycythemia vera Hypokalemia - K being replaced  Ruptured appendicitis with abscess s/p per drain 6/27 - multiple organisms present on culture - developed cellulitis around drain site >> improved - drain output low, but output continues to be purulent - patient is afebrile, WBC WNL  AKI  - vancomycin stopped 7/3 - Cr improving, 1.89 today from 2.23 - renal u/s 7/4 unremarkable  ID - zosyn 6/26>>day#11, vancomycin 6/29>>7/3 FEN - regular diet VTE - SCDs  Plan - WBC is WNL and patient afebrile. Ok to transition to oral antibiotics. Continue drain. Diet as tolerated. Will discuss with MD which surgeon patient should follow up with. Planning SNF at discharge.   LOS: 10 days    Jerrye Beavers , [facility] Surgery 06/11/2017, 8:11 AM Pager: [phone] Consults: [phone] Mon-Fri 7:00 am-4:30 pm Sat-Sun 7:00 am-11:30 am

## 2017-06-11 NOTE — Progress Notes (Signed)
CSW met with patient spouse at bedside and discussed SNF choices. She plans to visit facilities tomorrow.  CSW will continue to follow and assist with patient discharge.   Kathrin Greathouse, Latanya Presser, MSW Clinical Social Worker 5E and Psychiatric Service Line [phone] 06/11/2017  11:53 AM

## 2017-06-11 NOTE — Progress Notes (Signed)
Physical Therapy Treatment Patient Details Name: John [name] MRN: [id number] DOB: [birth year] Today's Date: 06/11/2017    History of Present Illness John [name] is a 66 y.o. 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  Probable ruptured appendix with abcess. Post drain placed.                   PT Comments    Pt progressing slowly.  Remains confused.  Poor tracking.  Only responds briefly to name.  Speaks random words.  Unable to functionally transfer and unable to functionally walk.  Unable to hold a glass of water when cued.  Hand over hand direction placed straw in mouth, pt started to bite down on straw.  Prior pt was home with spouse walking and loved to pick up sticks in his yard.    Follow Up Recommendations  SNF     Equipment Recommendations  None recommended by PT    Recommendations for Other Services       Precautions / Restrictions Precautions Precautions: Fall Precaution Comments: Hx TBI/ETOH easily agitated, right drain from abdomen, ABD binder, does better with wife Coralyn Mark present Restrictions Weight Bearing Restrictions: No    Mobility  Bed Mobility Overal bed mobility: Needs Assistance Bed Mobility: Supine to Sit     Supine to sit: +2 for physical assistance;+2 for safety/equipment;HOB elevated;Mod assist     General bed mobility comments: repeat functional one word VC's pt demonstarted inceased self assist partially.   Once upright pt able to static sit EOC x 8 min at Supervision.    Transfers Overall transfer level: Needs assistance Equipment used: 2 person hand held assist Transfers: Sit to/from Stand           General transfer comment: sit to stand several times however  unsuccessfully as pt began to push backward as if he was on the edge of a cliff.  Increased anxiety.  Resistant.  was only able to transfer from elevated bed to recliner 1/4 turn with Total Assist.    Ambulation/Gait             General Gait Details: unable to attempt amb this session due to increased anxiety/agitation and pushing backward.    Stairs            Wheelchair Mobility    Modified Rankin (Stroke Patients Only)       Balance                                            Cognition Arousal/Alertness: Awake/alert Behavior During Therapy: Agitated;Anxious Overall Cognitive Status: Impaired/Different from baseline Area of Impairment: Following commands;Safety/judgement;Orientation;Attention;Memory;Problem solving;Awareness                               General Comments: unable to track, unable to follow commands, responds to name but briefly.  Speech is broken with random words.        Exercises      General Comments        Pertinent Vitals/Pain Pain Assessment: No/denies pain    Home Living  Prior Function            PT Goals (current goals can now be found in the care plan section) Progress towards PT goals: Progressing toward goals    Frequency    Min 3X/week      PT Plan Current plan remains appropriate    Co-evaluation              AM-PAC PT "6 Clicks" Daily Activity  Outcome Measure  Difficulty turning over in bed (including adjusting bedclothes, sheets and blankets)?: Total Difficulty moving from lying on back to sitting on the side of the bed? : Total Difficulty sitting down on and standing up from a chair with arms (e.g., wheelchair, bedside commode, etc,.)?: Total Help needed moving to and from a bed to chair (including a wheelchair)?: Total Help needed walking in hospital room?: Total Help needed climbing 3-5 steps with a railing? : Total 6 Click Score: 6     End of Session Equipment Utilized During Treatment: Gait belt Activity Tolerance: Other (comment) (impaired cognition) Patient left: in chair;with chair alarm set;with family/visitor present;with call bell/phone within reach Nurse Communication: Mobility status PT Visit Diagnosis: Unsteadiness on feet (R26.81)     Time: 3953-2023 PT Time Calculation (min) (ACUTE ONLY): 29 min  Charges:  $Therapeutic Activity: 23-37 mins                    G Codes:       {Allex Madia  PTA [facility]  Acute  Rehab Pager      [phone]

## 2017-06-11 NOTE — Progress Notes (Signed)
Referring Physician(s): Rosenbower,T  Supervising Physician: Markus Daft  Patient Status:  [facility] - In-pt  Chief Complaint:  Appendiceal abscess  Subjective: Pt without acute changes; wife not in room   Allergies: Patient has no known allergies.  Medications: Prior to Admission medications   Medication Sig Start Date End Date Taking? Authorizing Provider  B COMPLEX VITAMINS PO Take by mouth 3 (three) times daily.   Yes [provider]  folic acid (FOLVITE) 1 MG tablet Take 1 tablet (1 mg total) by mouth daily. 06/25/15  Yes Thurnell Lose, MD  loratadine (CLARITIN) 10 MG tablet Take 10 mg by mouth daily.   Yes [provider]  rivaroxaban (XARELTO) 20 MG TABS tablet Take 1 tablet (20 mg total) by mouth daily with supper. 09/18/16  Yes Gorsuch, Ni, MD  thiamine 100 MG tablet Take 1 tablet (100 mg total) by mouth daily. 06/25/15  Yes Thurnell Lose, MD  vitamin B-12 (CYANOCOBALAMIN) 1000 MCG tablet Take 1,000 mcg by mouth daily.   Yes [provider]     Vital Signs: BP 111/65 (BP Location: Left Arm)   Pulse 65   Temp 98.7 F (37.1 C) (Axillary)   Resp 18   Ht 5\' 10"  (1.778 m)   Wt 158 lb 8.2 oz (71.9 kg)   SpO2 96%   BMI 22.74 kg/m   Physical Exam rt abd drain intact, binder still in place; output 15 cc  Imaging: Ct Head Wo Contrast  Result Date: 06/08/2017 CLINICAL DATA:  Dementia with altered mental status.  Fever. EXAM: CT HEAD WITHOUT CONTRAST TECHNIQUE: Contiguous axial images were obtained from the base of the skull through the vertex without intravenous contrast. COMPARISON:  June 01, 2017 FINDINGS: Brain: Moderately severe diffuse atrophy remains stable. There is no intracranial mass, hemorrhage, extra-axial fluid collection, or midline shift. There is mild small vessel disease in the centra semiovale bilaterally. Elsewhere gray-white compartments appear normal. No acute infarct evident. Vascular: No hyperdense vessel. There is  calcification in each carotid siphon region, more on the left than on the right. Skull: Bony calvarium appears intact. Sinuses/Orbits: Visualized paranasal sinuses are clear. Visualized orbits appear symmetric bilaterally. Other: Mastoid air cells are clear. IMPRESSION: Stable diffuse atrophy. Slight periventricular small vessel disease. No intracranial mass, hemorrhage, or extra-axial fluid collection. No evidence of acute infarct. There are foci of arterial vascular calcification. Electronically Signed   By: Lowella Grip III M.D.   On: 06/08/2017 10:48   Ct Soft Tissue Neck W Contrast  Result Date: 06/08/2017 CLINICAL DATA:  Neck pain. Patient admitted with perforated appendicitis. EXAM: CT NECK WITH CONTRAST TECHNIQUE: Multidetector CT imaging of the neck was performed using the standard protocol following the bolus administration of intravenous contrast. CONTRAST:  179mL ISOVUE-300 IOPAMIDOL (ISOVUE-300) INJECTION 61% COMPARISON:  None. FINDINGS: Pharynx and larynx: No evidence of mass or swelling. Salivary glands: No inflammation, mass, or stone. Thyroid: Normal. Lymph nodes: None enlarged or abnormal density. Vascular: Moderate calcified plaque at the common carotid bifurcations. Limited intracranial: Atherosclerotic calcification. No acute finding. Visualized orbits: Negative. Mastoids and visualized paranasal sinuses: Negative Skeleton: Head is mildly rotated to the right without subluxation, fracture, or aggressive bone lesion. Generalized cervical disc narrowing and uncovertebral spurring with foraminal stenosis primarily from C3-4 to C6-7. Upper chest: Negative IMPRESSION: 1. No acute or inflammatory finding. 2. Cervical disc degeneration with bilateral foraminal narrowing from C3-4 to C6-7. Electronically Signed   By: Monte Fantasia M.D.   On: 06/08/2017 11:07  Ct Abdomen Pelvis W Contrast  Result Date: 06/08/2017 CLINICAL DATA:  Perforated appendicitis with abscess status post percutaneous  drainage, cellulitis, dementia EXAM: CT ABDOMEN AND PELVIS WITH CONTRAST TECHNIQUE: Multidetector CT imaging of the abdomen and pelvis was performed using the standard protocol following bolus administration of intravenous contrast. CONTRAST:  12mL ISOVUE-300 IOPAMIDOL (ISOVUE-300) INJECTION 61% COMPARISON:  05/26/2017 FINDINGS: Lower chest: Small bilateral pleural effusions. Associated bilateral lower lobe opacities, likely atelectasis. Hepatobiliary: Liver is within normal limits. Gallbladder is unremarkable. No intrahepatic or extrahepatic ductal dilatation. Pancreas: Within normal limits. Spleen: Calcified splenic granulomata. Adrenals/Urinary Tract: Adrenal glands are within normal limits. Kidneys are notable for bilateral renal sinus cysts. No hydronephrosis. Bladder is mildly thick-walled anteriorly although underdistended (series 2/image 82). Stomach/Bowel: Stomach is within normal limits. No evidence of bowel obstruction. Changes related to recent appendicitis with percutaneous drainage of periappendiceal abscess. Indwelling pigtail drainage catheter (series 2/image 57) without residual fluid collection. Sigmoid diverticulosis, without evidence of diverticulitis. Vascular/Lymphatic: No evidence of abdominal aortic aneurysm. No suspicious abdominopelvic lymphadenopathy. Reproductive: Prostate is grossly unremarkable. Other: No abdominopelvic ascites. Moderate right and small left fat containing inguinal hernias. Musculoskeletal: Overlying inflammatory changes/hematoma along the course of the catheter in the right lateral abdominal wall (series 2/image 57). Suspected 4.1 x 1.7 cm intramuscular hematoma (series 14/ image 29). Mild degenerative changes of the lumbar spine. IMPRESSION: Status post percutaneous drainage of periappendiceal abscess related to acute appendicitis. No residual fluid collection in the right lower quadrant. Suspected 4.1 x 1.7 cm intramuscular hematoma along the right lateral abdominal  wall, along the course of the pigtail drainage catheter. Overlying inflammatory changes. Small bilateral pleural effusions. Associated bilateral lower lobe opacities, likely atelectasis. Electronically Signed   By: Julian Hy M.D.   On: 06/08/2017 11:06   US Renal  Result Date: 06/09/2017 CLINICAL DATA:  Acute kidney injury EXAM: RENAL / URINARY TRACT ULTRASOUND COMPLETE COMPARISON:  CT 06/08/2017 FINDINGS: Right Kidney: Length: 11.3 cm. Echogenicity within normal limits. No mass or hydronephrosis visualized. Left Kidney: Length: 11.9 cm he. Echogenicity within normal limits. No mass or hydronephrosis visualized. Parapelvic cysts noted as seen on prior CT. Bladder: Appears normal for degree of bladder distention. IMPRESSION: Unremarkable renal ultrasound. Electronically Signed   By: Rolm Baptise M.D.   On: 06/09/2017 11:39    Labs:  CBC:  Recent Labs  06/08/17 0907 06/09/17 0402 06/10/17 0818 06/11/17 0421  WBC 12.9* 13.4* 10.3 10.1  HGB 14.9 15.0 13.3 13.8  HCT 42.8 43.8 39.0 41.4  PLT 397 384 384 373    COAGS:  Recent Labs  06/01/17 2147  INR 1.40    BMP:  Recent Labs  06/07/17 0742  06/08/17 2259 06/09/17 0402 06/10/17 0359 06/11/17 0421  NA 140  --   --  146* 144 144  K 3.4*  --   --  3.7 2.9* 3.3*  CL 107  --   --  111 108 109  CO2 24  --   --  25 27 27   GLUCOSE 115*  --   --  117* 162* 141*  BUN 6  --   --  15 15 13   CALCIUM 8.4*  --   --  9.0 8.7* 8.7*  CREATININE 0.88  < > 2.42* 2.54* 2.23* 1.89*  GFRNONAA >60  < > 26* 25* 29* 35*  GFRAA >60  < > 30* 29* 34* 41*  < > = values in this interval not displayed.  LIVER FUNCTION TESTS:  Recent Labs  07/24/16 1525  06/01/17 1219  BILITOT 0.49 0.9  AST 20 38  ALT 17 47  ALKPHOS 61 98  PROT 7.4 7.6  ALBUMIN 3.9 3.4*    Assessment and Plan: Appendiceal abscess, status post drainage on 06/02/17; AF; WBC nl; creat 1.89(2.23), K 3.3; cont current tx; once output is less than 10 cc for 2 consecutive  days would inject drain before considering removal   Electronically Signed: D. Rowe Robert, PA-C 06/11/2017, 12:45 PM   I spent a total of 15 minutes at the the patient's bedside AND on the patient's hospital floor or unit, greater than 50% of which was counseling/coordinating care for appendiceal abscess drain    Patient ID: John [name], male   DOB: 06-13-1951, 66 y.o.   MRN: [id number]

## 2017-06-11 NOTE — Progress Notes (Signed)
PROGRESS NOTE    John [name]  YWV:[id number] DOB: June 08, 1951 DOA: 06/01/2017 PCP: Cari Caraway, MD   Brief Narrative: AARIT [name] is a 66 y.o. 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 Patient presented with abdominal pain, decrease oral intake, progressive weakness. He is found to have appendicitis with perforation and abscess. General surgery consulted for evaluation. He is s/p percutaneous drainage on 6/27. He has developed cellulitis since then. Wound cultures significant for multiple organisms and he was broadened to vancomycin/Zosyn to cover, in addition to cellulitis. Overall, improving very slowly. Repeat CT planned for 7/3. CT head/neck planned for 7/3. Disposition is likely SNF.   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 S/p percutaneous drain on 06/02/17.  Culture is significant for multiple organisms including gram positive cocci in pairs, gram negative coccobacilli and gram positive rods. -General surgery/IR recommendations -Continue Zosyn/ stop vancomycin due to AKI.  -Continue IV fluids -WBC trending down.   Appendicitis w/ rupture/abscess Abdominal pain -continue drain per IR -cultures ; multiple organism.  -repeat abdominal CT Status post percutaneous drainage of periappendiceal abscess related to acute appendicitis. No residual fluid collection in the right lower quadrant. Suspected 4.1 x 1.7 cm intramuscular hematoma along the right lateral abdominal wall, along the course of the pigtail drainage catheter.   AKI;  Cr increase to 2. Discontinue vancomycin.  Continue with  IV fluids.  Renal US; negative for  hydronephrosis.  Improving, slight decreased cr 2.2  Cellulitis Over drain site. Improved. -Continue zosyn -discontinue vancomycin due to AKI. Follow WBC>   Somnolence More alert today   Dementia Patient is currently not agitated/violent -Continue Ativan when necessary  History of PE 2016 Patient on Xarelto as an outpatient. This was held on admission. Started on Lovenox VTE prophylaxis dose on admission. S/p perc drain. -hold xarelto due to hematoma and AKI. Will order DVT prophylaxis, discussed with Sx.   Neck pain Unknown cause. Possible torticollis. Difficult in setting of cognitive impairment -CT neck ; Cervical disc degeneration with bilateral foraminal narrowing from C3-4 to C6-7.   DVT prophylaxis: hold Xarelto  due to AKI  Code Status: Full code Family Communication: none at bedside.  Disposition Plan: Discharge pending management of appendicitis, PT recommendations   Consultants:   General surgery  Interventional radiology  Procedures:   Percutaneous drainage (06/02/17)  Antimicrobials:  Zosyn  Vancomycin   Subjective: Appears in pain.  sleepy  Objective: Vitals:   06/10/17 0638 06/10/17 1445 06/10/17 2118 06/11/17 0515  BP:  112/78 111/75 111/65  Pulse: 75 63 67 65  Resp:  16 18 18   Temp:  97.8 F (36.6 C) 98.7 F (37.1 C) 98.7 F (37.1 C)  TempSrc:  Axillary Axillary Axillary  SpO2:  98% 97% 96%  Weight:      Height:        Intake/Output Summary (Last 24 hours) at 06/11/17 1404 Last data filed at 06/11/17 0542  Gross per 24 hour  Intake          1658.34 ml  Output              535 ml  Net  1123.34 ml   Filed Weights   06/01/17 2048 06/02/17 0948  Weight: 75.3 kg (166 lb) 71.9 kg (158 lb 8.2 oz)    Examination:  General exam: NAD Respiratory system: CTA Cardiovascular system: S 1, S 2 RRR Gastrointestinal system: right side drain in place, dressing abdomen wall. Central nervous system: alert, confuse.    Extremities: No edema or leg tenderness     Data Reviewed: I have personally reviewed following labs and imaging studies  CBC:  Recent Labs Lab 06/07/17 0742 06/08/17 0907 06/09/17 0402 06/10/17 0818 06/11/17 0421  WBC 13.3* 12.9* 13.4* 10.3 10.1  HGB 14.3 14.9 15.0 13.3 13.8  HCT 41.4 42.8 43.8 39.0 41.4  MCV 93.7 93.7 95.2 96.1 96.7  PLT 365 397 384 384 944   Basic Metabolic Panel:  Recent Labs Lab 06/07/17 0742 06/08/17 0514 06/08/17 2259 06/09/17 0402 06/10/17 0359 06/11/17 0421  NA 140  --   --  146* 144 144  K 3.4*  --   --  3.7 2.9* 3.3*  CL 107  --   --  111 108 109  CO2 24  --   --  25 27 27   GLUCOSE 115*  --   --  117* 162* 141*  BUN 6  --   --  15 15 13   CREATININE 0.88 1.22 2.42* 2.54* 2.23* 1.89*  CALCIUM 8.4*  --   --  9.0 8.7* 8.7*   GFR: Estimated Creatinine Clearance: 39.1 mL/min (A) (by C-G formula based on SCr of 1.89 mg/dL (H)). Liver Function Tests: No results for input(s): AST, ALT, ALKPHOS, BILITOT, PROT, ALBUMIN in the last 168 hours. No results for input(s): LIPASE, AMYLASE in the last 168 hours. No results for input(s): AMMONIA in the last 168 hours. Coagulation Profile: No results for input(s): INR, PROTIME in the last 168 hours. Cardiac Enzymes: No results for input(s): CKTOTAL, CKMB, CKMBINDEX, TROPONINI in the last 168 hours. BNP (last 3 results) No results for input(s): PROBNP in the last 8760 hours. HbA1C: No results for input(s): HGBA1C in the last 72 hours. CBG: No results for input(s): GLUCAP in the last 168 hours. Lipid Profile: No results for input(s): CHOL, HDL, LDLCALC, TRIG, CHOLHDL, LDLDIRECT in the last 72 hours. Thyroid Function Tests: No results for input(s): TSH, T4TOTAL, FREET4, T3FREE, THYROIDAB in the last 72 hours. Anemia Panel: No results for input(s): VITAMINB12, FOLATE, FERRITIN, TIBC, IRON, RETICCTPCT in the last 72 hours. Sepsis Labs: No results for input(s): PROCALCITON, LATICACIDVEN in the last  168 hours.  Recent Results (from the past 240 hour(s))  Culture, blood (routine x 2)     Status: None   Collection Time: 06/01/17  3:19 PM  Result Value Ref Range Status   Specimen Description BLOOD RIGHT ANTECUBITAL  Final   Special Requests   Final    BOTTLES DRAWN AEROBIC AND ANAEROBIC Blood Culture adequate volume   Culture   Final    NO GROWTH 5 DAYS Performed at [facility], [address], [place], [place] [address]    Report Status 06/06/2017 FINAL  Final  Culture, blood (routine x 2)     Status: None   Collection Time: 06/01/17  4:04 PM  Result Value Ref Range Status   Specimen Description BLOOD LEFT FOREARM  Final   Special Requests   Final    BOTTLES DRAWN AEROBIC AND ANAEROBIC Blood Culture adequate volume   Culture   Final    NO GROWTH 5 DAYS Performed at [facility], [place] [address], [place], [place] [address]    Report Status 06/06/2017 FINAL  Final  Surgical PCR screen     Status: None   Collection Time: 06/02/17  4:04 PM  Result Value Ref Range Status   MRSA, PCR NEGATIVE NEGATIVE Final   Staphylococcus aureus NEGATIVE NEGATIVE Final    Comment:        The Xpert SA Assay (FDA approved for NASAL specimens in patients over 61 years of age), is one component of a comprehensive surveillance program.  Test performance has been validated by [facility] for patients greater than or equal to 17 year old. It is not intended to diagnose infection nor to guide or monitor treatment.   Aerobic/Anaerobic Culture (surgical/deep wound)     Status: Abnormal   Collection Time: 06/02/17  5:10 PM  Result Value Ref Range Status   Specimen Description ABSCESS RLQ CT DRAIN  Final   Special Requests NONE  Final   Gram Stain   Final    MODERATE WBC PRESENT, PREDOMINANTLY PMN ABUNDANT GRAM POSITIVE COCCI IN PAIRS ABUNDANT GRAM NEGATIVE COCCOBACILLI FEW GRAM POSITIVE RODS Performed at [facility], [place] [address], [place], [place] [address]    Culture  (A)  Final    MULTIPLE ORGANISMS PRESENT, NONE PREDOMINANT MIXED ANAEROBIC FLORA PRESENT.  CALL LAB IF FURTHER IID REQUIRED.    Report Status 06/07/2017 FINAL  Final         Radiology Studies: No results found.      Scheduled Meds: . enoxaparin (LOVENOX) injection  40 mg Subcutaneous Q24H  . famotidine  20 mg Oral Daily  . feeding supplement  1 Container Oral TID BM  . folic acid  1 mg Oral Daily  . thiamine  100 mg Oral Daily   Continuous Infusions: . dextrose 5 % and 0.45% NaCl 100 mL/hr at 06/11/17 1024  . piperacillin-tazobactam (ZOSYN)  IV Stopped (06/11/17 0408)     LOS: 10 days     Evyn Kooyman, Cassie Freer, MD [facility] 06/11/2017, 2:04 PM Pager: [phone]  If 7PM-7AM, please contact night-coverage [email or web address] Password [email or web address] 06/11/2017, 2:04 PM

## 2017-06-11 NOTE — Plan of Care (Signed)
Problem: Safety: Goal: Ability to remain free from injury will improve Outcome: Completed/Met Date Met: 06/11/17 Bed alarm set.

## 2017-06-12 DIAGNOSIS — R4182 Altered mental status, unspecified: Secondary | ICD-10-CM

## 2017-06-12 LAB — BASIC METABOLIC PANEL
ANION GAP: 6 (ref 5–15)
BUN: 9 mg/dL (ref 6–20)
CALCIUM: 8.9 mg/dL (ref 8.9–10.3)
CO2: 29 mmol/L (ref 22–32)
Chloride: 107 mmol/L (ref 101–111)
Creatinine, Ser: 1.76 mg/dL — ABNORMAL HIGH (ref 0.61–1.24)
GFR, EST AFRICAN AMERICAN: 45 mL/min — AB (ref >60)
GFR, EST NON AFRICAN AMERICAN: 39 mL/min — AB (ref >60)
Glucose, Bld: 126 mg/dL — ABNORMAL HIGH (ref 65–99)
POTASSIUM: 3.2 mmol/L — AB (ref 3.5–5.1)
SODIUM: 142 mmol/L (ref 135–145)

## 2017-06-12 LAB — CBC
HEMATOCRIT: 41.9 % (ref 39.0–52.0)
HEMOGLOBIN: 14.1 g/dL (ref 13.0–17.0)
MCH: 32 pg (ref 26.0–34.0)
MCHC: 33.7 g/dL (ref 30.0–36.0)
MCV: 95.2 fL (ref 78.0–100.0)
Platelets: 358 10*3/uL (ref 150–400)
RBC: 4.4 MIL/uL (ref 4.22–5.81)
RDW: 13.8 % (ref 11.5–15.5)
WBC: 9.1 10*3/uL (ref 4.0–10.5)

## 2017-06-12 MED ORDER — POTASSIUM CHLORIDE CRYS ER 20 MEQ PO TBCR
40.0000 meq | EXTENDED_RELEASE_TABLET | Freq: Once | ORAL | Status: AC
  Administered 2017-06-12: 40 meq via ORAL
  Filled 2017-06-12: qty 2

## 2017-06-12 MED ORDER — RIVAROXABAN 20 MG PO TABS
20.0000 mg | ORAL_TABLET | Freq: Every day | ORAL | Status: DC
  Administered 2017-06-12 – 2017-06-13 (×2): 20 mg via ORAL
  Filled 2017-06-12 (×2): qty 1

## 2017-06-12 NOTE — Progress Notes (Signed)
General Surgery [facility]  Assessment & Plan: Ruptured appendicitis with abscess s/p per drain 6/27  developed cellulitis around drain site >> improved  drain output low, but output continues to be purulent - follow up CT scan per IR  patient is afebrile, WBC WNL  zosyn 6/26>>day#11  OK to transition to oral abx at time of discharge - likely Augmentin either capsule or liquid  Will follow - will arrange follow up with Dr. Marlou Starks at [place] office upon discharge to SNF.        Earnstine Regal, MD, [facility]       Office: [phone]    Chief Complaint: Perforated appy  Subjective: Patient in bed, comfortable.  Family at bedside.  Objective: Vital signs in last 24 hours: Temp:  [98.2 F (36.8 C)-98.8 F (37.1 C)] 98.6 F (37 C) (07/07 0429) Pulse Rate:  [55-65] 55 (07/07 0429) Resp:  [18] 18 (07/07 0429) BP: (120-143)/(70-90) 143/80 (07/07 0429) SpO2:  [98 %-100 %] 98 % (07/07 0429) Last BM Date: 06/11/17  Intake/Output from previous day: 07/06 0701 - 07/07 0700 In: 2921.7 [P.O.:300; I.V.:2471.7; IV Piggyback:150] Out: 1900 [Urine:1900] Intake/Output this shift: No intake/output data recorded.  Physical Exam: HEENT - sclerae clear, mucous membranes moist Neck - soft Abdomen - soft, without distension; mild tenderness RLQ; drain in place with small tan output  Lab Results:   Recent Labs  06/11/17 0421 06/12/17 0744  WBC 10.1 9.1  HGB 13.8 14.1  HCT 41.4 41.9  PLT 373 358   BMET  Recent Labs  06/11/17 0421 06/12/17 0744  NA 144 142  K 3.3* 3.2*  CL 109 107  CO2 27 29  GLUCOSE 141* 126*  BUN 13 9  CREATININE 1.89* 1.76*  CALCIUM 8.7* 8.9   PT/INR No results for input(s): LABPROT, INR in the last 72 hours. Comprehensive Metabolic Panel:    Component Value Date/Time   NA 142 06/12/2017 0744   NA 144 06/11/2017 0421   NA 141 07/24/2016 1525   K 3.2 (L) 06/12/2017 0744   K 3.3 (L) 06/11/2017  0421   K 4.4 07/24/2016 1525   CL 107 06/12/2017 0744   CL 109 06/11/2017 0421   CO2 29 06/12/2017 0744   CO2 27 06/11/2017 0421   CO2 27 07/24/2016 1525   BUN 9 06/12/2017 0744   BUN 13 06/11/2017 0421   BUN 14.8 07/24/2016 1525   CREATININE 1.76 (H) 06/12/2017 0744   CREATININE 1.89 (H) 06/11/2017 0421   CREATININE 0.9 07/24/2016 1525   GLUCOSE 126 (H) 06/12/2017 0744   GLUCOSE 141 (H) 06/11/2017 0421   GLUCOSE 95 07/24/2016 1525   CALCIUM 8.9 06/12/2017 0744   CALCIUM 8.7 (L) 06/11/2017 0421   CALCIUM 10.0 07/24/2016 1525   AST 38 06/01/2017 1219   AST 20 07/24/2016 1525   AST 22 06/24/2015 1800   ALT 47 06/01/2017 1219   ALT 17 07/24/2016 1525   ALT 14 (L) 06/24/2015 1800   ALKPHOS 98 06/01/2017 1219   ALKPHOS 61 07/24/2016 1525   ALKPHOS 61 06/24/2015 1800   BILITOT 0.9 06/01/2017 1219   BILITOT 0.49 07/24/2016 1525   BILITOT 0.8 06/24/2015 1800   PROT 7.6 06/01/2017 1219   PROT 7.4 07/24/2016 1525   PROT 6.7 06/24/2015 1800   ALBUMIN 3.4 (L) 06/01/2017 1219   ALBUMIN 3.9 07/24/2016 1525   ALBUMIN 3.9 06/24/2015 1800    Studies/Results: No results  found.    John [name] M 06/12/2017  Patient ID: John [name], male   DOB: [birth year], 66 y.o.   MRN: [id number]

## 2017-06-12 NOTE — Progress Notes (Addendum)
Pharmacy Antibiotic Note  John [name] is a 66 y.o. male presented to the ED on 06/01/2017 with c/o AMS, weakness and poor oral intake.  Abd CT showed ruptured appendicitis with abscess -- perc drain placed by IR on 06/02/17.  Zosyn was started on 6/26 for abscess and vancomycin added on 6/29 for cellulitis around drain site.  Patient went into AKI with vancomycin d/ced on 7/4  Today, 06/12/2017: - day #12 zosyn - afeb, wbc wnl - scr trending down with 1.89 on 06/11/17 (crcl~39)   Plan: - continue zosyn 3.375 gn IV q8h (infuse over 4 hrs). CCS indicates plan to transition to PO soon. - monitor renal function closely  - Patient's xarelto 20 mg daily for hx PE currently on hold d/t AKI (crcl<30) and abd wall hematoma. He's currently on lovenox 40 mg SQ daily -- pharmacy will sign off for lovenox since dose is appr for indication and renal function.  CrCl now >30.  If hematoma is improving and full AC is deemed safe to resume back, can resume xarelto back at 20mg  daily dose.  Adden (2PM): per Dr. Tyrell Antonio, ok to resume xarelto back today - d/c lovenox 40 mg daily - start xarelto 20 mg daily.  Pharmacy will sign off note writing for xarelto but will follow patient peripherally along with you. ____________________________  Height: 5\' 10"  (177.8 cm) Weight: 158 lb 8.2 oz (71.9 kg) IBW/kg (Calculated) : 73  Temp (24hrs), Avg:98.5 F (36.9 C), Min:98.2 F (36.8 C), Max:98.8 F (37.1 C)   Recent Labs Lab 06/06/17 1223  06/08/17 0514 06/08/17 0907 06/08/17 1307 06/08/17 2259 06/09/17 0402 06/10/17 0359 06/10/17 0818 06/11/17 0421 06/12/17 0744  WBC  --   < >  --  12.9*  --   --  13.4*  --  10.3 10.1 9.1  CREATININE  --   < > 1.22  --   --  2.42* 2.54* 2.23*  --  1.89*  --   VANCOTROUGH 12*  --   --   --  28*  --   --   --   --   --   --   VANCORANDOM  --   --   --   --   --  23  --   --   --   --   --   < > = values in this interval not displayed.  Estimated Creatinine Clearance:  39.1 mL/min (A) (by C-G formula based on SCr of 1.89 mg/dL (H)).    No Known Allergies  Antimicrobials this admission: 6/26 Zosyn >>  6/29 Vanc >>   Dose adjustments this admission: 7/1 VT = 12, on 1g q12h, continue same. 7/3 at 1300 VT = 28 (drawn 1 hr late, on 1gm q12h)--> hold 7/3 at 2300 VR = 23 --> hold 7/4 at 2200 VR =     Microbiology results: 6/26 BCx x2: neg FINAL 6/27 abscess cx: mult spp. None predominant. Holding for anaerobe.  6/27 MRSA PCR: neg  Thank you for allowing pharmacy to be a part of this patient's care.  Lynelle Doctor 06/12/2017 8:48 AM

## 2017-06-12 NOTE — Clinical Social Work Note (Signed)
Met with patient's wife this afternoon to discuss possible bed choices. She states she has toured [facility] and is very interested in this facility as it is close to where she works and on her way home (they live in [place]). She would prefer placement at the [facility] but states that patient was declined. Wife hopes to visit [facility] and possibly [facility] "for comparison" but seems partial to [facility].  Patient noted to be very confused and wife reports he has had dementia diagnosis for quite a while.  She is hoping he can get stronger so that she can take him home in the future.  CSW services will continue to monitor for possible date of stability vs bed choice.  Lorie Phenix. Pauline Good, [place] (weekend coverage)

## 2017-06-12 NOTE — Progress Notes (Signed)
PROGRESS NOTE    John [name]  DQQ:[id number] DOB: 04/22/1951 DOA: 06/01/2017 PCP: Cari Caraway, MD   Brief Narrative: John [name] is a 66 y.o. 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 Patient presented with abdominal pain, decrease oral intake, progressive weakness. He is found to have appendicitis with perforation and abscess. General surgery consulted for evaluation. He is s/p percutaneous drainage on 6/27. He has developed cellulitis since then. Wound cultures significant for multiple organisms and he was broadened to vancomycin/Zosyn to cover, in addition to cellulitis. Overall, improving very slowly. Repeat CT planned for 7/3. CT head/neck planned for 7/3. Disposition is likely SNF.   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 S/p percutaneous drain on 06/02/17.  Culture is significant for multiple organisms including gram positive cocci in pairs, gram negative coccobacilli and gram positive rods. -General surgery/IR recommendations -Continue Zosyn/ stop vancomycin due to AKI.  -Continue IV fluids -WBC trending down.   Appendicitis w/ rupture/abscess Abdominal pain -continue drain per IR -cultures ; multiple organism.  -repeat abdominal CT Status post percutaneous drainage of periappendiceal abscess related to acute appendicitis. No residual fluid collection in the right lower quadrant. Suspected 4.1 x 1.7 cm intramuscular hematoma along the right lateral abdominal wall, along the course of the pigtail drainage catheter. Plan to transition to oral antibiotics at discharge.   AKI;  Cr increase to 2. Discontinue vancomycin.    Continue with  IV fluids.  Renal US; negative for hydronephrosis.  Improving, slight decreased cr 1.7  Cellulitis Over drain site. Improved. -Continue zosyn -discontinue vancomycin due to AKI. Follow WBC>   Delirium, Dementia Patient is currently not agitated/violent Discontinue ativan. Decrease pain medications.  Out of bed, avoid sedatives.   Hypokalemia;  Replete orally.   History of PE 2016 Patient on Xarelto as an outpatient. This was held on admission. Started on Lovenox VTE prophylaxis dose on admission. S/p perc drain. -resume xarelto.   Neck pain Unknown cause. Possible torticollis. Difficult in setting of cognitive impairment -CT neck ; Cervical disc degeneration with bilateral foraminal narrowing from C3-4 to C6-7.   DVT prophylaxis: hold Xarelto  due to AKI  Code Status: Full code Family Communication: none at bedside.  Disposition Plan: Discharge pending management of appendicitis, PT recommendations   Consultants:   General surgery  Interventional radiology  Procedures:   Percutaneous drainage (06/02/17)  Antimicrobials:  Zosyn  Vancomycin   Subjective: More alert, still not at baseline.  Denies pain   Objective: Vitals:   06/11/17 1445 06/11/17 2059 06/12/17 0429 06/12/17 1322  BP: 120/70 (!) 143/90 (!) 143/80 132/78  Pulse: 65 64 (!) 55 70  Resp: 18 18 18 18   Temp: 98.8 F (37.1 C) 98.2 F (36.8 C) 98.6 F (37 C) 97.7 F (36.5 C)  TempSrc: Axillary Oral Axillary Axillary  SpO2: 100% 99% 98% 98%  Weight:      Height:        Intake/Output Summary (Last 24 hours) at 06/12/17 1341 Last data filed at 06/12/17 1322  Gross per 24 hour  Intake          3371.67 ml  Output  1400 ml  Net          1971.67 ml   Filed Weights   06/01/17 2048 06/02/17 0948  Weight: 75.3 kg (166 lb) 71.9 kg (158 lb 8.2 oz)    Examination:  General exam: NAD Respiratory system: CTA Cardiovascular system: S 1, S 2 RRR Gastrointestinal  system: right side drain in place Central nervous system: alert, confuse.  Extremities: no edema     Data Reviewed: I have personally reviewed following labs and imaging studies  CBC:  Recent Labs Lab 06/08/17 0907 06/09/17 0402 06/10/17 0818 06/11/17 0421 06/12/17 0744  WBC 12.9* 13.4* 10.3 10.1 9.1  HGB 14.9 15.0 13.3 13.8 14.1  HCT 42.8 43.8 39.0 41.4 41.9  MCV 93.7 95.2 96.1 96.7 95.2  PLT 397 384 384 373 976   Basic Metabolic Panel:  Recent Labs Lab 06/07/17 0742  06/08/17 2259 06/09/17 0402 06/10/17 0359 06/11/17 0421 06/12/17 0744  NA 140  --   --  146* 144 144 142  K 3.4*  --   --  3.7 2.9* 3.3* 3.2*  CL 107  --   --  111 108 109 107  CO2 24  --   --  25 27 27 29   GLUCOSE 115*  --   --  117* 162* 141* 126*  BUN 6  --   --  15 15 13 9   CREATININE 0.88  < > 2.42* 2.54* 2.23* 1.89* 1.76*  CALCIUM 8.4*  --   --  9.0 8.7* 8.7* 8.9  < > = values in this interval not displayed. GFR: Estimated Creatinine Clearance: 42 mL/min (A) (by C-G formula based on SCr of 1.76 mg/dL (H)). Liver Function Tests: No results for input(s): AST, ALT, ALKPHOS, BILITOT, PROT, ALBUMIN in the last 168 hours. No results for input(s): LIPASE, AMYLASE in the last 168 hours. No results for input(s): AMMONIA in the last 168 hours. Coagulation Profile: No results for input(s): INR, PROTIME in the last 168 hours. Cardiac Enzymes: No results for input(s): CKTOTAL, CKMB, CKMBINDEX, TROPONINI in the last 168 hours. BNP (last 3 results) No results for input(s): PROBNP in the last 8760 hours. HbA1C: No results for input(s): HGBA1C in the last 72 hours. CBG: No results for input(s): GLUCAP in the last 168 hours. Lipid Profile: No results for input(s): CHOL, HDL, LDLCALC, TRIG, CHOLHDL, LDLDIRECT in the last 72 hours. Thyroid Function Tests: No results for input(s): TSH, T4TOTAL, FREET4, T3FREE, THYROIDAB in the last 72 hours. Anemia Panel: No results for input(s): VITAMINB12, FOLATE,  FERRITIN, TIBC, IRON, RETICCTPCT in the last 72 hours. Sepsis Labs: No results for input(s): PROCALCITON, LATICACIDVEN in the last 168 hours.  Recent Results (from the past 240 hour(s))  Surgical PCR screen     Status: None   Collection Time: 06/02/17  4:04 PM  Result Value Ref Range Status   MRSA, PCR NEGATIVE NEGATIVE Final   Staphylococcus aureus NEGATIVE NEGATIVE Final    Comment:        The Xpert SA Assay (FDA approved for NASAL specimens in patients over 82 years of age), is one component of a comprehensive surveillance program.  Test performance has been validated by [facility] for patients greater than or equal to 57 year old. It is not intended to diagnose infection nor to guide or monitor treatment.   Aerobic/Anaerobic Culture (surgical/deep wound)     Status: Abnormal   Collection Time: 06/02/17  5:10 PM  Result Value Ref Range Status   Specimen Description  ABSCESS RLQ CT DRAIN  Final   Special Requests NONE  Final   Gram Stain   Final    MODERATE WBC PRESENT, PREDOMINANTLY PMN ABUNDANT GRAM POSITIVE COCCI IN PAIRS ABUNDANT GRAM NEGATIVE COCCOBACILLI FEW GRAM POSITIVE RODS Performed at [facility], [place] [address] [place], [place] [address]    Culture (A)  Final    MULTIPLE ORGANISMS PRESENT, NONE PREDOMINANT MIXED ANAEROBIC FLORA PRESENT.  CALL LAB IF FURTHER IID REQUIRED.    Report Status 06/07/2017 FINAL  Final         Radiology Studies: No results found.      Scheduled Meds: . enoxaparin (LOVENOX) injection  40 mg Subcutaneous Q24H  . famotidine  20 mg Oral Daily  . feeding supplement  1 Container Oral TID BM  . folic acid  1 mg Oral Daily  . thiamine  100 mg Oral Daily   Continuous Infusions: . dextrose 5 % and 0.45% NaCl 100 mL/hr at 06/12/17 0500  . piperacillin-tazobactam (ZOSYN)  IV Stopped (06/12/17 1335)     LOS: 11 days     Regalado, Belkys A, MD [facility] 06/12/2017, 1:41 PM Pager: [phone]  If  7PM-7AM, please contact night-coverage [email or web address] Password [email or web address] 06/12/2017, 1:41 PM

## 2017-06-13 LAB — BASIC METABOLIC PANEL
ANION GAP: 9 (ref 5–15)
BUN: 9 mg/dL (ref 6–20)
CALCIUM: 8.7 mg/dL — AB (ref 8.9–10.3)
CHLORIDE: 105 mmol/L (ref 101–111)
CO2: 25 mmol/L (ref 22–32)
CREATININE: 1.59 mg/dL — AB (ref 0.61–1.24)
GFR calc non Af Amer: 44 mL/min — ABNORMAL LOW (ref >60)
GFR, EST AFRICAN AMERICAN: 51 mL/min — AB (ref >60)
Glucose, Bld: 132 mg/dL — ABNORMAL HIGH (ref 65–99)
Potassium: 3 mmol/L — ABNORMAL LOW (ref 3.5–5.1)
SODIUM: 139 mmol/L (ref 135–145)

## 2017-06-13 MED ORDER — POTASSIUM CHLORIDE CRYS ER 20 MEQ PO TBCR
40.0000 meq | EXTENDED_RELEASE_TABLET | Freq: Once | ORAL | Status: AC
  Administered 2017-06-13: 40 meq via ORAL
  Filled 2017-06-13: qty 2

## 2017-06-13 NOTE — Progress Notes (Signed)
    Referring Physician(s): Rosenbower,T  Supervising Physician: Aletta Edouard  Patient Status:  [name] - In-pt  Chief Complaint:  Appendiceal abscess  Subjective:  Pt without acute changes; wife in room; questions answered  Allergies: Patient has no known allergies.  Medications: Prior to Admission medications   Medication Sig Start Date End Date Taking? Authorizing Provider  B COMPLEX VITAMINS PO Take by mouth 3 (three) times daily.   Yes [provider]  folic acid (FOLVITE) 1 MG tablet Take 1 tablet (1 mg total) by mouth daily. 06/25/15  Yes Thurnell Lose, MD  loratadine (CLARITIN) 10 MG tablet Take 10 mg by mouth daily.   Yes [provider]  rivaroxaban (XARELTO) 20 MG TABS tablet Take 1 tablet (20 mg total) by mouth daily with supper. 09/18/16  Yes Gorsuch, Ni, MD  thiamine 100 MG tablet Take 1 tablet (100 mg total) by mouth daily. 06/25/15  Yes Thurnell Lose, MD  vitamin B-12 (CYANOCOBALAMIN) 1000 MCG tablet Take 1,000 mcg by mouth daily.   Yes [provider]     Vital Signs: BP (!) 147/78 (BP Location: Left Arm)   Pulse 62   Temp 98.2 F (36.8 C) (Oral)   Resp 18   Ht 5\' 10"  (1.778 m)   Wt 158 lb 8.2 oz (71.9 kg)   SpO2 99%   BMI 22.74 kg/m   Physical Exam;  rt abd drain intact, binder in place, output minimal but turbid, yellow fluid  Imaging: No results found.  Labs:  CBC:  Recent Labs  06/09/17 0402 06/10/17 0818 06/11/17 0421 06/12/17 0744  WBC 13.4* 10.3 10.1 9.1  HGB 15.0 13.3 13.8 14.1  HCT 43.8 39.0 41.4 41.9  PLT 384 384 373 358    COAGS:  Recent Labs  06/01/17 2147  INR 1.40    BMP:  Recent Labs  06/10/17 0359 06/11/17 0421 06/12/17 0744 06/13/17 0429  NA 144 144 142 139  K 2.9* 3.3* 3.2* 3.0*  CL 108 109 107 105  CO2 27 27 29 25   GLUCOSE 162* 141* 126* 132*  BUN 15 13 9 9   CALCIUM 8.7* 8.7* 8.9 8.7*  CREATININE 2.23* 1.89* 1.76* 1.59*  GFRNONAA 29* 35* 39* 44*  GFRAA 34* 41*  45* 51*    LIVER FUNCTION TESTS:  Recent Labs  07/24/16 1525 06/01/17 1219  BILITOT 0.49 0.9  AST 20 38  ALT 17 47  ALKPHOS 61 98  PROT 7.4 7.6  ALBUMIN 3.9 3.4*    Assessment and Plan: Appendiceal abscess, status post drainage on 06/02/17; AF; WBC nl; creat 1.59(1.76)- cont IV hydration; K 3.0- replace; drain output minimal; last CT on 7/3 showed no residual collection; consider drain injection next week to r/o fistula before removing drain  Electronically Signed: D. Rowe Robert, PA-C 06/13/2017, 1:54 PM   I spent a total of 15 minutes at the the patient's bedside AND on the patient's hospital floor or unit, greater than 50% of which was counseling/coordinating care for abdominal abscess drain    Patient ID: John [name], male   DOB: Jun 02, 1951, 66 y.o.   MRN: [id number]

## 2017-06-13 NOTE — Progress Notes (Signed)
   Subjective/Chief Complaint: Comfortable this morning   Objective: Vital signs in last 24 hours: Temp:  [97.7 F (36.5 C)-98.5 F (36.9 C)] 98.2 F (36.8 C) (07/08 0724) Pulse Rate:  [57-70] 62 (07/08 0724) Resp:  [18] 18 (07/08 0724) BP: (132-150)/(78-79) 147/78 (07/08 0724) SpO2:  [98 %-99 %] 99 % (07/08 0724) Last BM Date: 06/12/17  Intake/Output from previous day: 07/07 0701 - 07/08 0700 In: 2870 [P.O.:300; I.V.:2400; IV Piggyback:150] Out: 1175 [Urine:1175] Intake/Output this shift: Total I/O In: -  Out: 600 [Urine:600]  Exam: Awake and alert Abdomen soft, drain still with purulence  Lab Results:   Recent Labs  06/11/17 0421 06/12/17 0744  WBC 10.1 9.1  HGB 13.8 14.1  HCT 41.4 41.9  PLT 373 358   BMET  Recent Labs  06/12/17 0744 06/13/17 0429  NA 142 139  K 3.2* 3.0*  CL 107 105  CO2 29 25  GLUCOSE 126* 132*  BUN 9 9  CREATININE 1.76* 1.59*  CALCIUM 8.9 8.7*   PT/INR No results for input(s): LABPROT, INR in the last 72 hours. ABG No results for input(s): PHART, HCO3 in the last 72 hours.  Invalid input(s): PCO2, PO2  Studies/Results: No results found.  Anti-infectives: Anti-infectives    Start     Dose/Rate Route Frequency Ordered Stop   06/04/17 1200  vancomycin (VANCOCIN) IVPB 1000 mg/200 mL premix  Status:  Discontinued     1,000 mg 200 mL/hr over 60 Minutes Intravenous Every 12 hours 06/04/17 1115 06/08/17 1409   06/02/17 0000  piperacillin-tazobactam (ZOSYN) IVPB 3.375 g     3.375 g 12.5 mL/hr over 240 Minutes Intravenous Every 8 hours 06/01/17 2103     06/01/17 1745  piperacillin-tazobactam (ZOSYN) IVPB 3.375 g     3.375 g 100 mL/hr over 30 Minutes Intravenous  Once 06/01/17 1730 06/01/17 1839   06/01/17 1730  piperacillin-tazobactam (ZOSYN) IVPB 4.5 g  Status:  Discontinued     4.5 g 200 mL/hr over 30 Minutes Intravenous  Once 06/01/17 1728 06/01/17 1730      Assessment/Plan: Perforated appendicitis with abscess s/p  IR placed perc drain  Disposition planning in progress Transitioning to oral antibiotics at discharge (Augmentin) Repeat CT per IR Will be following up with Dr. Marlou Starks after discharge  LOS: 12 days    Garl Speigner A 06/13/2017

## 2017-06-13 NOTE — Progress Notes (Signed)
PROGRESS NOTE    John [name]  TDV:[id number] DOB: 10-Oct-1951 DOA: 06/01/2017 PCP: Cari Caraway, MD   Brief Narrative: John [name] is a 66 y.o. 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 Patient presented with abdominal pain, decrease oral intake, progressive weakness. He is found to have appendicitis with perforation and abscess. General surgery consulted for evaluation. He is s/p percutaneous drainage on 6/27. He has developed cellulitis since then. Wound cultures significant for multiple organisms and he was broadened to vancomycin/Zosyn to cover, in addition to cellulitis. Overall, improving very slowly. Repeat CT planned for 7/3. CT head/neck planned for 7/3. Disposition is likely SNF.   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 S/p percutaneous drain on 06/02/17.  Culture is significant for multiple organisms including gram positive cocci in pairs, gram negative coccobacilli and gram positive rods. -General surgery/IR recommendations -Continue Zosyn/ stop vancomycin due to AKI.  -Continue IV fluids -WBC trending down.   Appendicitis w/ rupture/abscess Abdominal pain -continue drain per IR -cultures ; multiple organism.  -repeat abdominal CT Status post percutaneous drainage of periappendiceal abscess related to acute appendicitis. No residual fluid collection in the right lower quadrant. Suspected 4.1 x 1.7 cm intramuscular hematoma along the right lateral abdominal wall, along the course of the pigtail drainage catheter. Plan to transition to oral antibiotics at discharge. Hopefully discharge in 24 hours.   AKI;  Cr  increase to 2. Discontinue vancomycin.  Continue with  IV fluids.  Renal US; negative for hydronephrosis.  Improving,  cr 1.7---1.5   Cellulitis Over drain site. Improved. -Continue zosyn -discontinue vancomycin due to AKI. Follow WBC>   Delirium, Dementia Patient is currently not agitated/violent Discontinue ativan. Decrease pain medications.  Out of bed, avoid sedatives.  Improved today. If continue to improved plan to transfer to [facility] 7-09.   Hypokalemia;  Replete orally.   History of PE 2016 Patient on Xarelto as an outpatient. This was held on admission. Started on Lovenox VTE prophylaxis dose on admission. S/p perc drain. -resume xarelto.   Neck pain Unknown cause. Possible torticollis. Difficult in setting of cognitive impairment -CT neck ; Cervical disc degeneration with bilateral foraminal narrowing from C3-4 to C6-7.   DVT prophylaxis: hold Xarelto  due to AKI  Code Status: Full code Family Communication: wife at bedside.  Disposition Plan: SNF in 24 if ok with sx and IR>    Consultants:   General surgery  Interventional radiology  Procedures:   Percutaneous drainage (06/02/17)  Antimicrobials:  Zosyn  Vancomycin   Subjective: Alert today, getting back to baseline per wife.  Objective: Vitals:   06/12/17 1322 06/12/17 2331 06/13/17 0724 06/13/17 1448  BP: 132/78 (!) 150/79 (!) 147/78 (!) 144/79  Pulse: 70 (!) 57 62 64  Resp: 18 18 18 18   Temp: 97.7 F (36.5 C) 98.5 F (36.9 C) 98.2 F (36.8 C) 98.3 F (36.8 C)  TempSrc: Axillary Oral Oral Axillary  SpO2: 98% 98% 99% 99%  Weight:      Height:        Intake/Output Summary (Last 24 hours) at 06/13/17 1614 Last data filed at 06/13/17 1449  Gross  per 24 hour  Intake             3030 ml  Output             1875 ml  Net             1155 ml   Filed Weights   06/01/17 2048 06/02/17 0948  Weight: 75.3 kg (166 lb) 71.9 kg (158 lb 8.2 oz)    Examination:  General exam: NAD Respiratory  system: CTA Cardiovascular system: S 1, S 2 RRR Gastrointestinal system: right side drain in place, Mild tenderness.  Central nervous system: Alert, confuse Extremities: no edema     Data Reviewed: I have personally reviewed following labs and imaging studies  CBC:  Recent Labs Lab 06/08/17 0907 06/09/17 0402 06/10/17 0818 06/11/17 0421 06/12/17 0744  WBC 12.9* 13.4* 10.3 10.1 9.1  HGB 14.9 15.0 13.3 13.8 14.1  HCT 42.8 43.8 39.0 41.4 41.9  MCV 93.7 95.2 96.1 96.7 95.2  PLT 397 384 384 373 314   Basic Metabolic Panel:  Recent Labs Lab 06/09/17 0402 06/10/17 0359 06/11/17 0421 06/12/17 0744 06/13/17 0429  NA 146* 144 144 142 139  K 3.7 2.9* 3.3* 3.2* 3.0*  CL 111 108 109 107 105  CO2 25 27 27 29 25   GLUCOSE 117* 162* 141* 126* 132*  BUN 15 15 13 9 9   CREATININE 2.54* 2.23* 1.89* 1.76* 1.59*  CALCIUM 9.0 8.7* 8.7* 8.9 8.7*   GFR: Estimated Creatinine Clearance: 46.5 mL/min (A) (by C-G formula based on SCr of 1.59 mg/dL (H)). Liver Function Tests: No results for input(s): AST, ALT, ALKPHOS, BILITOT, PROT, ALBUMIN in the last 168 hours. No results for input(s): LIPASE, AMYLASE in the last 168 hours. No results for input(s): AMMONIA in the last 168 hours. Coagulation Profile: No results for input(s): INR, PROTIME in the last 168 hours. Cardiac Enzymes: No results for input(s): CKTOTAL, CKMB, CKMBINDEX, TROPONINI in the last 168 hours. BNP (last 3 results) No results for input(s): PROBNP in the last 8760 hours. HbA1C: No results for input(s): HGBA1C in the last 72 hours. CBG: No results for input(s): GLUCAP in the last 168 hours. Lipid Profile: No results for input(s): CHOL, HDL, LDLCALC, TRIG, CHOLHDL, LDLDIRECT in the last 72 hours. Thyroid Function Tests: No results for input(s): TSH, T4TOTAL, FREET4, T3FREE, THYROIDAB in the last 72 hours. Anemia Panel: No results for input(s): VITAMINB12, FOLATE, FERRITIN, TIBC, IRON, RETICCTPCT in the last 72  hours. Sepsis Labs: No results for input(s): PROCALCITON, LATICACIDVEN in the last 168 hours.  No results found for this or any previous visit (from the past 240 hour(s)).       Radiology Studies: No results found.      Scheduled Meds: . famotidine  20 mg Oral Daily  . feeding supplement  1 Container Oral TID BM  . folic acid  1 mg Oral Daily  . rivaroxaban  20 mg Oral Q supper  . thiamine  100 mg Oral Daily   Continuous Infusions: . dextrose 5 % and 0.45% NaCl 100 mL/hr at 06/13/17 1302  . piperacillin-tazobactam (ZOSYN)  IV 3.375 g (06/13/17 1207)     LOS: 12 days     John [name], Cassie Freer, MD [facility] 06/13/2017, 4:14 PM Pager: [phone]  If 7PM-7AM, please contact night-coverage [email or web address] Password [email or web address] 06/13/2017, 4:14 PM

## 2017-06-14 LAB — CBC
HCT: 41.1 % (ref 39.0–52.0)
Hemoglobin: 13.8 g/dL (ref 13.0–17.0)
MCH: 31.2 pg (ref 26.0–34.0)
MCHC: 33.6 g/dL (ref 30.0–36.0)
MCV: 93 fL (ref 78.0–100.0)
PLATELETS: 305 10*3/uL (ref 150–400)
RBC: 4.42 MIL/uL (ref 4.22–5.81)
RDW: 13.9 % (ref 11.5–15.5)
WBC: 7.9 10*3/uL (ref 4.0–10.5)

## 2017-06-14 LAB — BASIC METABOLIC PANEL
Anion gap: 7 (ref 5–15)
BUN: 8 mg/dL (ref 6–20)
CHLORIDE: 107 mmol/L (ref 101–111)
CO2: 26 mmol/L (ref 22–32)
CREATININE: 1.39 mg/dL — AB (ref 0.61–1.24)
Calcium: 8.7 mg/dL — ABNORMAL LOW (ref 8.9–10.3)
GFR calc Af Amer: 59 mL/min — ABNORMAL LOW (ref >60)
GFR calc non Af Amer: 51 mL/min — ABNORMAL LOW (ref >60)
Glucose, Bld: 129 mg/dL — ABNORMAL HIGH (ref 65–99)
Potassium: 3.5 mmol/L (ref 3.5–5.1)
SODIUM: 140 mmol/L (ref 135–145)

## 2017-06-14 MED ORDER — BOOST / RESOURCE BREEZE PO LIQD: 1.0000 | Freq: Three times a day (TID) | ORAL | 0 refills | Status: DC

## 2017-06-14 MED ORDER — AMOXICILLIN-POT CLAVULANATE 875-125 MG PO TABS: 1.0000 | ORAL_TABLET | Freq: Two times a day (BID) | ORAL | 0 refills | Status: AC

## 2017-06-14 MED ORDER — TRAMADOL HCL 50 MG PO TABS: 50.0000 mg | ORAL_TABLET | Freq: Four times a day (QID) | ORAL | 0 refills | Status: DC | PRN

## 2017-06-14 MED ORDER — ACETAMINOPHEN 325 MG PO TABS: 650.0000 mg | ORAL_TABLET | Freq: Four times a day (QID) | ORAL | 0 refills | Status: DC | PRN

## 2017-06-14 NOTE — Progress Notes (Signed)
Patient ID: John [name], male   DOB: 12/04/1951, 66 y.o.   MRN: [id number]  [facility] Surgery Progress Note     Subjective: CC- perforated appendicitis s/p perc drain Denies any abdominal pain. Tolerated more of diet yesterday. Having bowel function. WBC WNL, afebrile. Planning to go to SNF today.  Objective: Vital signs in last 24 hours: Temp:  [97.8 F (36.6 C)-98.3 F (36.8 C)] 97.8 F (36.6 C) (07/09 0448) Pulse Rate:  [64-80] 80 (07/09 0448) Resp:  [17-18] 17 (07/09 0448) BP: (113-145)/(67-93) 145/93 (07/09 0448) SpO2:  [98 %-100 %] 100 % (07/09 0448) Last BM Date: 06/13/17  Intake/Output from previous day: 07/08 0701 - 07/09 0700 In: 2980 [P.O.:880; I.V.:2000; IV Piggyback:100] Out: 1200 [Urine:1200] Intake/Output this shift: No intake/output data recorded.  PE: Gen: Alert, NAD, cooperative HEENT: EOM's intact, pupils equal  Pulm: effort normal Cardio: RRR Abd: Soft, ND, nontender, minimal turbid/tan fluid in drain, no surrounding cellulitis Ext: No erythema, edema, or tenderness BUE/BLE   Lab Results:   Recent Labs  06/12/17 0744 06/14/17 0421  WBC 9.1 7.9  HGB 14.1 13.8  HCT 41.9 41.1  PLT 358 305   BMET  Recent Labs  06/13/17 0429 06/14/17 0421  NA 139 140  K 3.0* 3.5  CL 105 107  CO2 25 26  GLUCOSE 132* 129*  BUN 9 8  CREATININE 1.59* 1.39*  CALCIUM 8.7* 8.7*   PT/INR No results for input(s): LABPROT, INR in the last 72 hours. CMP     Component Value Date/Time   NA 140 06/14/2017 0421   NA 141 07/24/2016 1525   K 3.5 06/14/2017 0421   K 4.4 07/24/2016 1525   CL 107 06/14/2017 0421   CO2 26 06/14/2017 0421   CO2 27 07/24/2016 1525   GLUCOSE 129 (H) 06/14/2017 0421   GLUCOSE 95 07/24/2016 1525   BUN 8 06/14/2017 0421   BUN 14.8 07/24/2016 1525   CREATININE 1.39 (H) 06/14/2017 0421   CREATININE 0.9 07/24/2016 1525   CALCIUM 8.7 (L) 06/14/2017 0421   CALCIUM 10.0 07/24/2016 1525   PROT 7.6 06/01/2017 1219   PROT  7.4 07/24/2016 1525   ALBUMIN 3.4 (L) 06/01/2017 1219   ALBUMIN 3.9 07/24/2016 1525   AST 38 06/01/2017 1219   AST 20 07/24/2016 1525   ALT 47 06/01/2017 1219   ALT 17 07/24/2016 1525   ALKPHOS 98 06/01/2017 1219   ALKPHOS 61 07/24/2016 1525   BILITOT 0.9 06/01/2017 1219   BILITOT 0.49 07/24/2016 1525   GFRNONAA 51 (L) 06/14/2017 0421   GFRAA 59 (L) 06/14/2017 0421   Lipase  No results found for: LIPASE     Studies/Results: No results found.  Anti-infectives: Anti-infectives    Start     Dose/Rate Route Frequency Ordered Stop   06/14/17 0000  amoxicillin-clavulanate (AUGMENTIN) 875-125 MG tablet     1 tablet Oral 2 times daily 06/14/17 0924 06/21/17 2359   06/04/17 1200  vancomycin (VANCOCIN) IVPB 1000 mg/200 mL premix  Status:  Discontinued     1,000 mg 200 mL/hr over 60 Minutes Intravenous Every 12 hours 06/04/17 1115 06/08/17 1409   06/02/17 0000  piperacillin-tazobactam (ZOSYN) IVPB 3.375 g     3.375 g 12.5 mL/hr over 240 Minutes Intravenous Every 8 hours 06/01/17 2103     06/01/17 1745  piperacillin-tazobactam (ZOSYN) IVPB 3.375 g     3.375 g 100 mL/hr over 30 Minutes Intravenous  Once 06/01/17 1730 06/01/17 1839   06/01/17 1730  piperacillin-tazobactam (  ZOSYN) IVPB 4.5 g  Status:  Discontinued     4.5 g 200 mL/hr over 30 Minutes Intravenous  Once 06/01/17 1728 06/01/17 1730       Assessment/Plan Dementia, h/o TIB 1998 H/o PE - on Xarelto at home Polycythemia vera  Ruptured appendicitis with abscess s/p per drain 6/27 - multiple organisms present on culture - developed cellulitis around drain site >> resolved - drain output low, still turbid but clearing up - patient is afebrile, WBC WNL  AKI  - vancomycin stopped 7/3 - renal u/s 7/4 unremarkable - Cr improving, 1.39  ID - augmentin 7/9>>, zosyn 6/26>>7/9, vancomycin 6/29>>7/3 FEN - regular diet VTE - SCDs  Plan - patient is ready for discharge to SNF from surgical standpoint. Recommend 1 week  of augmentin. Follow-up in drain clinic later this week, and with Dr. Marlou Starks after that. Planning SNF at discharge.   LOS: 13 days    Jerrye Beavers , [facility] Surgery 06/14/2017, 9:45 AM Pager: [phone] Consults: [phone] Mon-Fri 7:00 am-4:30 pm Sat-Sun 7:00 am-11:30 am

## 2017-06-14 NOTE — Progress Notes (Signed)
Report called to [employer] at [facility] [employer].

## 2017-06-14 NOTE — Care Management Important Message (Signed)
Important Message  Patient Details  Name: John [name] MRN: [id number] Date of Birth: [birth year]   Medicare Important Message Given:  Yes    Kerin Salen 06/14/2017, 10:25 AM

## 2017-06-14 NOTE — Clinical Social Work Placement (Signed)
Pt discharging to transfer to [facility] today. All information provided to facility via the [facility]. Family notified- wife Karna Christmas. Is going to facility at 2pm to complete paperwork, following which CSW will arrange transportation via [facility] (completed medical necessity form)  Report #: [phone]  See below for placement details  CLINICAL SOCIAL WORK PLACEMENT  NOTE  Date:  06/14/2017  Patient Details  Name: John [name] MRN: [id number] Date of Birth: Aug 25, 1951  Clinical Social Work is seeking post-discharge placement for this patient at the [place] level of care (*CSW will initial, date and re-position this form in  chart as items are completed):  Yes   Patient/family provided with [facility] Work Department's list of facilities offering this level of care within the geographic area requested by the patient (or if unable, by the patient's family).  Yes   Patient/family informed of their freedom to choose among providers that offer the needed level of care, that participate in Medicare, Medicaid or managed care program needed by the patient, have an available bed and are willing to accept the patient.  Yes   Patient/family informed of [facility]'s ownership interest in [facility] and [facility], as well as of the fact that they are under no obligation to receive care at these facilities.  PASRR submitted to [facility] on 06/08/17     PASRR number received on 06/09/17     Existing PASRR number confirmed on       FL2 transmitted to all facilities in geographic area requested by pt/family on 06/08/17     FL2 transmitted to all facilities within larger geographic area on       Patient informed that his/her managed care company has contracts with or will negotiate with certain facilities, including the following:        Yes   Patient/family informed of bed offers received.  Patient chooses bed at [facility]     Physician  recommends and patient chooses bed at [facility]    Patient to be transferred to [facility] on 06/14/17.  Patient to be transferred to facility by [facility]     Patient family notified on 06/14/17 of transfer.  Name of family member notified:  Wife Terri     PHYSICIAN       Additional Comment:    _______________________________________________ Nila Nephew, LCSW 06/14/2017, 10:57 AM

## 2017-06-14 NOTE — Discharge Summary (Signed)
Physician Discharge Summary  MANDY FITZWATER PIR:[id number] DOB: 1951/02/24 DOA: 06/01/2017  PCP: Cari Caraway, MD  Admit date: 06/01/2017 Discharge date: 06/14/2017  Admitted From: Home  Disposition:  SNF  Recommendations for Outpatient Follow-up:  1. Follow up with PCP in 1-2 weeks 2. Please obtain BMP/CBC in one week 3. Needs to follow up with IR and surgery for further care of drainage.    Discharge Condition: Stable.  CODE STATUS: Full code.  Diet recommendation: Heart Healthy  Brief/Interim Summary: John [name] is a 66 y.o. 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 Patient presented with abdominal pain, decrease oral intake, progressive weakness. He is found to have appendicitis with perforation and abscess. General surgery consulted for evaluation. He is s/p percutaneous drainage on 6/27. He has developed cellulitis since then. Wound cultures significant for multiple organisms and he was broadened to vancomycin/Zosyn to cover, in addition to cellulitis. Overall, improving very slowly. Repeat CT planned for 7/3. CT head/neck planned for 7/3. Disposition is likely SNF.   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 S/p percutaneous drain on 06/02/17.  Culture is significant for multiple organisms including gram positive cocci in pairs, gram negative coccobacilli and gram positive rods. -General surgery/IR recommendations -Continue Zosyn/ stop vancomycin due to AKI.  -Continue IV fluids -WBC trending down. Resolved.   Appendicitis w/ rupture/abscess Abdominal pain -continue drain per IR -cultures ; multiple organism.   -repeat abdominal CT Status post percutaneous drainage of periappendiceal abscess related to acute appendicitis. No residual fluid collection in the right lower quadrant. Suspected 4.1 x 1.7 cm intramuscular hematoma along the right lateral abdominal wall, along the course of the pigtail drainage catheter. Plan to transition to oral antibiotics at discharge. Discharge today, one week of Augmentin. Needs to follow up with IR later this week for evaluation of drainage.   AKI;  Cr increase to 2. Discontinue vancomycin.  Continue with  IV fluids.  Renal US; negative for hydronephrosis.  Improving,  cr 1.7---1.5 --1.3  Cellulitis Over drain site. Improved. -Continue zosyn -discontinue vancomycin due to AKI. Follow WBC>   Delirium, Dementia Patient is currently not agitated/violent Discontinue ativan. Decrease pain medications.  Out of bed, avoid sedatives.  Improved today.  Hypokalemia;  Replete orally.   History of PE 2016 Patient on Xarelto as an outpatient. This was held on admission. Started on Lovenox VTE prophylaxis dose on admission. S/p perc drain. -resume xarelto.   Neck pain Unknown cause. Possible torticollis. Difficult in setting of cognitive impairment -CT neck ; Cervical disc degeneration with bilateral foraminal narrowing from C3-4 to C6-7.   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   amoxicillin-clavulanate 875-125 MG tablet Commonly known as:  AUGMENTIN Take 1 tablet by mouth 2 (two) times daily.   B COMPLEX VITAMINS PO Take by mouth 3 (three) times daily.   feeding supplement Liqd Take 1 Container by mouth 3 (three) times daily between meals.   folic acid 1 MG tablet Commonly known as:  FOLVITE Take 1 tablet (1 mg total) by mouth daily.   loratadine 10 MG tablet Commonly known as:  CLARITIN Take 10 mg by mouth daily.   rivaroxaban 20 MG Tabs tablet Commonly known as:  XARELTO Take 1 tablet (20 mg total) by mouth daily with supper.   thiamine 100 MG tablet Take 1 tablet (100 mg total) by mouth daily.   traMADol 50 MG tablet Commonly known as:  ULTRAM Take 1 tablet (50 mg total) by mouth every 6 (six) hours as needed for moderate pain.   vitamin B-12 1000 MCG tablet Commonly known as:  CYANOCOBALAMIN Take 1,000 mcg by mouth daily.      Follow-up Information    Jovita Kussmaul, MD. Call in 2 week(s).   Specialty:  General Surgery Why:  Please call to make a follow-up appointment with Dr. Marlou Starks regarding your drain and possible appendectomy in the future Contact information: [place] [address] [phone]          No Known Allergies  Consultations:  IR  Surgery    Procedures/Studies: Dg Chest 2 View  Result Date: 06/01/2017 CLINICAL DATA:  Altered mental status, dementia, slight drooping on the right EXAM: CHEST  2 VIEW COMPARISON:  Chest x-ray of 05/31/2017 FINDINGS: No active infiltrate or effusion is seen. Mediastinal and hilar contours are unremarkable. The heart is within normal limits in size. No acute bony abnormality is seen. IMPRESSION: No active cardiopulmonary disease. Electronically Signed   By: Ivar Drape M.D.   On: 06/01/2017 14:42   Dg Chest 2 View  Result Date: 05/31/2017 CLINICAL DATA:  Fever and weakness. EXAM: CHEST  2 VIEW  COMPARISON:  06/24/2015 chest radiograph FINDINGS: The cardiomediastinal silhouette is unremarkable. Mild bibasilar atelectasis noted. There is no evidence of focal airspace disease, pulmonary edema, suspicious pulmonary nodule/mass, pleural effusion, or pneumothorax. No acute bony abnormalities are identified. IMPRESSION: Mild basilar atelectasis. Electronically Signed   By: Margarette Canada M.D.   On: 05/31/2017 18:54   Ct Head Wo Contrast  Result Date: 06/08/2017 CLINICAL DATA:  Dementia with altered mental status.  Fever. EXAM: CT HEAD WITHOUT CONTRAST TECHNIQUE: Contiguous axial images were obtained from the base of the skull through the vertex without intravenous contrast. COMPARISON:  June 01, 2017 FINDINGS: Brain: Moderately severe diffuse atrophy remains stable. There is no intracranial mass, hemorrhage, extra-axial fluid collection, or midline shift. There is mild small vessel disease in the centra semiovale bilaterally. Elsewhere gray-white compartments appear normal. No acute infarct evident. Vascular: No hyperdense vessel. There is calcification in each carotid siphon region, more on the left than on the right. Skull: Bony calvarium appears intact. Sinuses/Orbits: Visualized paranasal sinuses are clear. Visualized orbits appear symmetric bilaterally. Other: Mastoid air cells are clear. IMPRESSION: Stable diffuse atrophy. Slight periventricular small vessel disease. No intracranial mass, hemorrhage, or extra-axial fluid collection. No  evidence of acute infarct. There are foci of arterial vascular calcification. Electronically Signed   By: Lowella Grip III M.D.   On: 06/08/2017 10:48   Ct Head Wo Contrast  Result Date: 06/01/2017 CLINICAL DATA:  Increased sleeping EXAM: CT HEAD WITHOUT CONTRAST TECHNIQUE: Contiguous axial images were obtained from the base of the skull through the vertex without intravenous contrast. COMPARISON:  None. FINDINGS: Brain: There is severe global atrophy. Minimal  chronic ischemic changes in the periventricular white matter are noted. No mass effect, midline shift, or acute hemorrhage. Vascular: No hyperdense vessel or unexpected calcification. Skull: Cranium is intact. Sinuses/Orbits: Mastoid air cells are clear. Visualized paranasal sinuses are clear. Other: Noncontributory IMPRESSION: No acute intracranial pathology. Electronically Signed   By: Marybelle Killings M.D.   On: 06/01/2017 17:00   Ct Soft Tissue Neck W Contrast  Result Date: 06/08/2017 CLINICAL DATA:  Neck pain. Patient admitted with perforated appendicitis. EXAM: CT NECK WITH CONTRAST TECHNIQUE: Multidetector CT imaging of the neck was performed using the standard protocol following the bolus administration of intravenous contrast. CONTRAST:  128mL ISOVUE-300 IOPAMIDOL (ISOVUE-300) INJECTION 61% COMPARISON:  None. FINDINGS: Pharynx and larynx: No evidence of mass or swelling. Salivary glands: No inflammation, mass, or stone. Thyroid: Normal. Lymph nodes: None enlarged or abnormal density. Vascular: Moderate calcified plaque at the common carotid bifurcations. Limited intracranial: Atherosclerotic calcification. No acute finding. Visualized orbits: Negative. Mastoids and visualized paranasal sinuses: Negative Skeleton: Head is mildly rotated to the right without subluxation, fracture, or aggressive bone lesion. Generalized cervical disc narrowing and uncovertebral spurring with foraminal stenosis primarily from C3-4 to C6-7. Upper chest: Negative IMPRESSION: 1. No acute or inflammatory finding. 2. Cervical disc degeneration with bilateral foraminal narrowing from C3-4 to C6-7. Electronically Signed   By: Monte Fantasia M.D.   On: 06/08/2017 11:07   Ct Abdomen Pelvis W Contrast  Result Date: 06/08/2017 CLINICAL DATA:  Perforated appendicitis with abscess status post percutaneous drainage, cellulitis, dementia EXAM: CT ABDOMEN AND PELVIS WITH CONTRAST TECHNIQUE: Multidetector CT imaging of the abdomen and pelvis  was performed using the standard protocol following bolus administration of intravenous contrast. CONTRAST:  171mL ISOVUE-300 IOPAMIDOL (ISOVUE-300) INJECTION 61% COMPARISON:  05/26/2017 FINDINGS: Lower chest: Small bilateral pleural effusions. Associated bilateral lower lobe opacities, likely atelectasis. Hepatobiliary: Liver is within normal limits. Gallbladder is unremarkable. No intrahepatic or extrahepatic ductal dilatation. Pancreas: Within normal limits. Spleen: Calcified splenic granulomata. Adrenals/Urinary Tract: Adrenal glands are within normal limits. Kidneys are notable for bilateral renal sinus cysts. No hydronephrosis. Bladder is mildly thick-walled anteriorly although underdistended (series 2/image 82). Stomach/Bowel: Stomach is within normal limits. No evidence of bowel obstruction. Changes related to recent appendicitis with percutaneous drainage of periappendiceal abscess. Indwelling pigtail drainage catheter (series 2/image 57) without residual fluid collection. Sigmoid diverticulosis, without evidence of diverticulitis. Vascular/Lymphatic: No evidence of abdominal aortic aneurysm. No suspicious abdominopelvic lymphadenopathy. Reproductive: Prostate is grossly unremarkable. Other: No abdominopelvic ascites. Moderate right and small left fat containing inguinal hernias. Musculoskeletal: Overlying inflammatory changes/hematoma along the course of the catheter in the right lateral abdominal wall (series 2/image 57). Suspected 4.1 x 1.7 cm intramuscular hematoma (series 14/ image 29). Mild degenerative changes of the lumbar spine. IMPRESSION: Status post percutaneous drainage of periappendiceal abscess related to acute appendicitis. No residual fluid collection in the right lower quadrant. Suspected 4.1 x 1.7 cm intramuscular hematoma along the right lateral abdominal wall, along the course of the pigtail drainage catheter. Overlying inflammatory changes. Small bilateral pleural effusions. Associated  bilateral lower lobe  opacities, likely atelectasis. Electronically Signed   By: Julian Hy M.D.   On: 06/08/2017 11:06   Ct Abdomen Pelvis W Contrast  Result Date: 06/03/2017 CLINICAL DATA:  Drain placed yesterday EXAM: CT ABDOMEN AND PELVIS WITH CONTRAST TECHNIQUE: Multidetector CT imaging of the abdomen and pelvis was performed using the standard protocol following bolus administration of intravenous contrast. CONTRAST:  180mL ISOVUE-300 IOPAMIDOL (ISOVUE-300) INJECTION 61% COMPARISON:  06/01/2017 FINDINGS: Lower chest: Dependent atelectasis in the lower lobes. Heart is borderline in size. Scattered coronary artery calcifications. Hepatobiliary: No focal hepatic abnormality. Gallbladder unremarkable. Pancreas: No focal abnormality or ductal dilatation. Spleen: No focal abnormality.  Normal size. Adrenals/Urinary Tract: Bilateral renal parapelvic cysts. No hydronephrosis. Adrenal glands and urinary bladder unremarkable. Stomach/Bowel: Changes of ruptured appendicitis again noted. There is sigmoid diverticulosis. No active diverticulitis. Stomach and small bowel decompressed. Vascular/Lymphatic: No evidence of aneurysm or adenopathy. Reproductive: Mild prostate prominence. Other: Right lower quadrant fluid collection has decreased with right lower quadrant drain in place. Fluid collection measures approximately 3.6 x 2.9 cm compared to 6.1 x 5.2 cm previously. No free fluid or free air. Bilateral inguinal hernias containing fat. Musculoskeletal: No acute bony abnormality. IMPRESSION: Decreasing size of the right lower quadrant retroperitoneal abscess post drain placement. Continued inflamed adjacent appendix compatible with ruptured appendicitis. Sigmoid diverticulosis. Dependent bibasilar atelectasis. Electronically Signed   By: Rolm Baptise M.D.   On: 06/03/2017 15:37   Ct Abdomen Pelvis W Contrast  Result Date: 06/01/2017 CLINICAL DATA:  66 year old male with right abdominal and pelvic pain for  several days. EXAM: CT ABDOMEN AND PELVIS WITH CONTRAST TECHNIQUE: Multidetector CT imaging of the abdomen and pelvis was performed using the standard protocol following bolus administration of intravenous contrast. CONTRAST:  110mL ISOVUE-300 IOPAMIDOL (ISOVUE-300) INJECTION 61% COMPARISON:  None. FINDINGS: Lower chest: Mild bibasilar atelectasis/ scarring noted. Coronary artery calcifications are present. Hepatobiliary: The liver and gallbladder are unremarkable. There is no evidence of biliary dilatation. Pancreas: Unremarkable Spleen: Unremarkable Adrenals/Urinary Tract: The kidneys, adrenal glands and bladder are unremarkable except for bilateral renal parapelvic cysts. Stomach/Bowel: A thickened inflamed appendix is noted with adjacent 5.2 x 6 x 7 cm complex fluid and inflammation in the lateral right lower abdomen abutting the abdominal musculature and compatible with ruptured appendicitis and ill-defined/developing abscess. There is no evidence of bowel obstruction or pneumoperitoneum. Colonic diverticulosis noted without evidence of diverticulitis. Vascular/Lymphatic: Aortic atherosclerosis. No enlarged abdominal or pelvic lymph nodes. Reproductive: Prostate unremarkable Other: No ascites. A moderate right inguinal hernia containing fat is noted. Musculoskeletal: No acute or suspicious abnormality. IMPRESSION: Ruptured appendicitis with adjacent 5.2 x 6 x 7 cm ill-defined/developing abscess. No evidence of pneumoperitoneum or bowel obstruction. Coronary artery disease. Aortic Atherosclerosis (ICD10-I70.0). Moderate right inguinal hernia containing fat. Results were called by telephone at the time of interpretation on 06/01/2017 at 5:07 pm to [facility] , who verbally acknowledged these results. Electronically Signed   By: Margarette Canada M.D.   On: 06/01/2017 17:10   US Renal  Result Date: 06/09/2017 CLINICAL DATA:  Acute kidney injury EXAM: RENAL / URINARY TRACT ULTRASOUND COMPLETE COMPARISON:  CT  06/08/2017 FINDINGS: Right Kidney: Length: 11.3 cm. Echogenicity within normal limits. No mass or hydronephrosis visualized. Left Kidney: Length: 11.9 cm he. Echogenicity within normal limits. No mass or hydronephrosis visualized. Parapelvic cysts noted as seen on prior CT. Bladder: Appears normal for degree of bladder distention. IMPRESSION: Unremarkable renal ultrasound. Electronically Signed   By: Rolm Baptise M.D.   On: 06/09/2017 11:39  Dg Abd Portable 1v  Result Date: 06/03/2017 CLINICAL DATA:  Abdominal pain . EXAM: PORTABLE ABDOMEN - 1 VIEW COMPARISON:  CT 06/02/2017. FINDINGS: Catheter noted over the right abdomen in stable position. No bowel distention. Prior hernia repair. Degenerative changes lumbar spine with scoliosis concave left . IMPRESSION: 1. Right abdominal drainage catheter in stable position. 2. No acute abnormality.  No bowel distention. Electronically Signed   By: Marcello Moores  Register   On: 06/03/2017 10:28   Ct Image Guided Drainage By Percutaneous Catheter  Result Date: 06/03/2017 CLINICAL DATA:  Appendiceal abscess EXAM: CT GUIDED DRAINAGE OF RETROPERITONEAL ABSCESS ANESTHESIA/SEDATION: Intravenous Fentanyl and Versed were administered as conscious sedation during continuous monitoring of the patient's level of consciousness and physiological / cardiorespiratory status by the radiology RN, with a total moderate sedation time of 21 minutes. PROCEDURE: The procedure, risks, benefits, and alternatives were explained to the patient. Questions regarding the procedure were encouraged and answered. The patient understands and consents to the procedure. Select axial scans were obtained through the lower abdomen. The collection was localized and an appropriate skin entry site was determined and marked. The operative field was prepped with chlorhexidinein a sterile fashion, and a sterile drape was applied covering the operative field. A sterile gown and sterile gloves were used for the  procedure. Local anesthesia was provided with 1% Lidocaine. Under CT fluoroscopic guidance, an 18 gauge trocar needle was advanced into the collection. Purulent material could be aspirated. An Amplatz wire advanced easily within the collection, its position confirmed on CT. Tract dilated to facilitate placement of a 10 French pigtail drain catheter. Position confirmed on CT. 30 mL of purulent aspirate were removed, sent for Gram stain and culture. Catheter secured externally with 0 Prolene suture and StatLock and placed to gravity drain bag. The patient tolerated the procedure well. COMPLICATIONS: None immediate FINDINGS: The complex right lower quadrant retroperitoneal collection was again localized. Ten French pigtail drain catheter placed without complication as above. IMPRESSION: 1. Technically successful CT-guided retroperitoneal abscess drain catheter placement Electronically Signed   By: Lucrezia Europe M.D.   On: 06/03/2017 09:30    (Echo, Carotid, EGD, Colonoscopy, ERCP)    Subjective:   Discharge Exam: Vitals:   06/13/17 2207 06/14/17 0448  BP: 113/67 (!) 145/93  Pulse: 64 80  Resp: 18 17  Temp: 98.1 F (36.7 C) 97.8 F (36.6 C)   Vitals:   06/13/17 0724 06/13/17 1448 06/13/17 2207 06/14/17 0448  BP: (!) 147/78 (!) 144/79 113/67 (!) 145/93  Pulse: 62 64 64 80  Resp: 18 18 18 17   Temp: 98.2 F (36.8 C) 98.3 F (36.8 C) 98.1 F (36.7 C) 97.8 F (36.6 C)  TempSrc: Oral Axillary Axillary Oral  SpO2: 99% 99% 98% 100%  Weight:      Height:        General: Pt is alert, awake, not in acute distress Cardiovascular: RRR, S1/S2 +, no rubs, no gallops Respiratory: CTA bilaterally, no wheezing, no rhonchi Abdominal: Soft, NT, ND, bowel sounds + Extremities: no edema, no cyanosis    The results of significant diagnostics from this hospitalization (including imaging, microbiology, ancillary and laboratory) are listed below for reference.     Microbiology: No results found for  this or any previous visit (from the past 240 hour(s)).   Labs: BNP (last 3 results) No results for input(s): BNP in the last 8760 hours. Basic Metabolic Panel:  Recent Labs Lab 06/10/17 0359 06/11/17 0421 06/12/17 0744 06/13/17 0429 06/14/17 0421  NA  144 144 142 139 140  K 2.9* 3.3* 3.2* 3.0* 3.5  CL 108 109 107 105 107  CO2 27 27 29 25 26   GLUCOSE 162* 141* 126* 132* 129*  BUN 15 13 9 9 8   CREATININE 2.23* 1.89* 1.76* 1.59* 1.39*  CALCIUM 8.7* 8.7* 8.9 8.7* 8.7*   Liver Function Tests: No results for input(s): AST, ALT, ALKPHOS, BILITOT, PROT, ALBUMIN in the last 168 hours. No results for input(s): LIPASE, AMYLASE in the last 168 hours. No results for input(s): AMMONIA in the last 168 hours. CBC:  Recent Labs Lab 06/09/17 0402 06/10/17 0818 06/11/17 0421 06/12/17 0744 06/14/17 0421  WBC 13.4* 10.3 10.1 9.1 7.9  HGB 15.0 13.3 13.8 14.1 13.8  HCT 43.8 39.0 41.4 41.9 41.1  MCV 95.2 96.1 96.7 95.2 93.0  PLT 384 384 373 358 305   Cardiac Enzymes: No results for input(s): CKTOTAL, CKMB, CKMBINDEX, TROPONINI in the last 168 hours. BNP: Invalid input(s): POCBNP CBG: No results for input(s): GLUCAP in the last 168 hours. D-Dimer No results for input(s): DDIMER in the last 72 hours. Hgb A1c No results for input(s): HGBA1C in the last 72 hours. Lipid Profile No results for input(s): CHOL, HDL, LDLCALC, TRIG, CHOLHDL, LDLDIRECT in the last 72 hours. Thyroid function studies No results for input(s): TSH, T4TOTAL, T3FREE, THYROIDAB in the last 72 hours.  Invalid input(s): FREET3 Anemia work up No results for input(s): VITAMINB12, FOLATE, FERRITIN, TIBC, IRON, RETICCTPCT in the last 72 hours. Urinalysis    Component Value Date/Time   COLORURINE STRAW (A) 06/09/2017 0806   APPEARANCEUR CLEAR 06/09/2017 0806   LABSPEC 1.011 06/09/2017 0806   PHURINE 6.0 06/09/2017 0806   GLUCOSEU NEGATIVE 06/09/2017 0806   HGBUR MODERATE (A) 06/09/2017 0806   BILIRUBINUR NEGATIVE  06/09/2017 0806   KETONESUR NEGATIVE 06/09/2017 0806   PROTEINUR NEGATIVE 06/09/2017 0806   UROBILINOGEN 0.2 06/24/2015 2257   NITRITE NEGATIVE 06/09/2017 0806   LEUKOCYTESUR NEGATIVE 06/09/2017 0806   Sepsis Labs Invalid input(s): PROCALCITONIN,  WBC,  LACTICIDVEN Microbiology No results found for this or any previous visit (from the past 240 hour(s)).   Time coordinating discharge: Over 30 minutes  SIGNED:   Elmarie Shiley, MD  [facility] 06/14/2017, 9:25 AM Pager   If 7PM-7AM, please contact night-coverage [email or web address] Password [email or web address]

## 2017-06-15 ENCOUNTER — Other Ambulatory Visit: Payer: Self-pay | Admitting: Radiology

## 2017-06-15 DIAGNOSIS — K6819 Other retroperitoneal abscess: Secondary | ICD-10-CM

## 2017-06-16 ENCOUNTER — Other Ambulatory Visit: Payer: Self-pay | Admitting: General Surgery

## 2017-06-16 DIAGNOSIS — K6819 Other retroperitoneal abscess: Secondary | ICD-10-CM

## 2017-06-18 ENCOUNTER — Emergency Department (HOSPITAL_COMMUNITY)
Admission: EM | Admit: 2017-06-18 | Discharge: 2017-06-19 | Disposition: A | Discharge status: Home / Self Care | Payer: Medicare Other | Attending: Emergency Medicine | Admitting: Emergency Medicine

## 2017-06-18 ENCOUNTER — Encounter (HOSPITAL_COMMUNITY): Payer: Self-pay | Admitting: Emergency Medicine

## 2017-06-18 DIAGNOSIS — I10 Essential (primary) hypertension: Secondary | ICD-10-CM | POA: Diagnosis not present

## 2017-06-18 DIAGNOSIS — Z7901 Long term (current) use of anticoagulants: Secondary | ICD-10-CM | POA: Insufficient documentation

## 2017-06-18 DIAGNOSIS — Z87891 Personal history of nicotine dependence: Secondary | ICD-10-CM | POA: Insufficient documentation

## 2017-06-18 DIAGNOSIS — Z79899 Other long term (current) drug therapy: Secondary | ICD-10-CM | POA: Diagnosis not present

## 2017-06-18 DIAGNOSIS — Y69 Unspecified misadventure during surgical and medical care: Secondary | ICD-10-CM | POA: Diagnosis not present

## 2017-06-18 DIAGNOSIS — F1027 Alcohol dependence with alcohol-induced persisting dementia: Secondary | ICD-10-CM | POA: Insufficient documentation

## 2017-06-18 DIAGNOSIS — T8189XA Other complications of procedures, not elsewhere classified, initial encounter: Secondary | ICD-10-CM | POA: Insufficient documentation

## 2017-06-18 NOTE — ED Triage Notes (Signed)
Brought in by EMS from [facility] facility with c/o percutaneous drain problem---- pt's drainage bag was detached from the percutaneous tube.

## 2017-06-18 NOTE — ED Notes (Signed)
Bed: GY56 Expected date:  Expected time:  Means of arrival:  Comments: 66 yo M  Urostomy problem

## 2017-06-19 NOTE — Discharge Instructions (Signed)
Return to the ED with any worsening symptoms - further complications of the drain, obvious pain, fever.

## 2017-06-19 NOTE — ED Notes (Signed)
[facility] was called for pt's transportation back to [facility] facility.

## 2017-06-19 NOTE — ED Notes (Signed)
[facility] staff was called and was notified of pt's discharge; discharge instructions also provided.

## 2017-06-19 NOTE — ED Provider Notes (Signed)
[facility] DEPT Provider Note   CSN: [id number] Arrival date & time: 06/18/17  2313     History   Chief Complaint Chief Complaint  Patient presents with  . Percutaneous Tube Problem    HPI John [name] is a 66 y.o. male.  Patient presents to the emergency department after the drainage collection bag came apart from the percutaneous drain in the right lower lateral abdominal wall. The patient has history of traumatic brain injury and dementia and cannot contribute to history. Per wife, the patient was admitted with a ruptured appendix in late June and the drain was inserted as treatment. He was discharged home after improving the [facility] for re[facility] and was scheduled to come home tomorrow. The facility contacted her tonight after the bag had come off the drain tip and he was being sent here for resolution. No fever. There has been no significant drainage into the bag, per wife, since discharge from the hospital.    The history is provided by the spouse. No language interpreter was used.    Past Medical History:  Diagnosis Date  . Adenomatous colon polyp   . Alcohol dependence ([place])   . Ataxia 07/05/2013  . Conjunctivitis, allergic   . 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 Hyperlipemia   . Hypertension   . Memory loss    dementia was in question in 2004  . Peyronie's disease    presumptive,resolved  . Polycythemia vera ([place]) 11/07/2015  . Vitamin B 12 deficiency     Patient Active Problem List   Diagnosis Date Noted  . Sepsis ([place]) 06/01/2017  . Ruptured appendix 06/01/2017  . Abscess of appendix 06/01/2017  . Agitation 06/01/2017  . Polycythemia vera ([place]) 11/07/2015  . History of pulmonary embolism 06/24/2015  . Dyspnea 06/24/2015  . Essential hypertension 06/24/2015  . Alcohol abuse   . Ataxia 07/05/2013  . 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  FRACTURE SURGERY  1997   leg  . INGUINAL HERNIA REPAIR Bilateral    w/ mesh  . TONSILLECTOMY  1973       Home Medications    Prior to Admission medications   Medication Sig Start Date End Date Taking? Authorizing Provider  acetaminophen (TYLENOL) 325 MG tablet Take 2 tablets (650 mg total) by mouth every 6 (six) hours as needed for mild pain (or Fever >/= 101). 06/14/17   Regalado, Belkys A, MD  amoxicillin-clavulanate (AUGMENTIN) 875-125 MG tablet Take 1 tablet by mouth 2 (two) times daily. 06/14/17 06/21/17  Regalado, Belkys A, MD  B COMPLEX VITAMINS PO Take by mouth 3 (three) times daily.    [provider]  feeding supplement (BOOST / RESOURCE BREEZE) LIQD Take 1 Container by mouth 3 (three) times daily between meals. 06/14/17   Regalado, Belkys A, MD  folic acid (FOLVITE) 1 MG tablet Take 1 tablet (1 mg total) by mouth daily. 06/25/15   Thurnell Lose, MD  loratadine (CLARITIN) 10 MG tablet Take 10 mg by mouth daily.    [provider]  rivaroxaban (XARELTO) 20 MG TABS tablet Take 1 tablet (20 mg total) by mouth daily with supper. 09/18/16   Heath Lark, MD  thiamine 100 MG tablet Take 1 tablet (100 mg total) by mouth daily. 06/25/15   Thurnell Lose, MD  traMADol (ULTRAM) 50 MG tablet Take 1 tablet (50 mg total) by mouth every 6 (six) hours as needed  for moderate pain. 06/14/17   Regalado, Belkys A, MD  vitamin B-12 (CYANOCOBALAMIN) 1000 MCG tablet Take 1,000 mcg by mouth daily.    [provider]    Family History Family History  Problem Relation Age of Onset  . Glaucoma Mother   . Stroke Father   . Clotting disorder Father   . Clotting disorder Sister     Social History Social History  Substance Use Topics  . Smoking status: Former Smoker    Quit date: 12/07/1972  . Smokeless tobacco: Never Used  . Alcohol use 1.8 oz/week    3 Glasses of wine per week     Comment: 2 glasses of wine daily     Allergies   Patient has no known  allergies.   Review of Systems Review of Systems  Unable to perform ROS: Dementia     Physical Exam Updated Vital Signs BP 131/79 (BP Location: Right Arm)   Pulse 74   Temp 98.6 F (37 C) (Axillary)   Resp 16   Ht 5\' 7"  (1.702 m)   Wt 71.7 kg (158 lb)   SpO2 93%   BMI 24.75 kg/m   Physical Exam  Constitutional: He is oriented to person, place, and time. He appears well-developed and well-nourished.  Neck: Normal range of motion.  Pulmonary/Chest: Effort normal.  Abdominal: He exhibits no distension. There is no tenderness.  Percutaneous drain right lateral abdominal wall. Site unremarkable. Drain tip exposed without active drainage or bleeding.  Musculoskeletal: Normal range of motion.  Neurological: He is alert and oriented to person, place, and time.  Skin: Skin is warm and dry.  Psychiatric: He has a normal mood and affect.     ED Treatments / Results  Labs (all labs ordered are listed, but only abnormal results are displayed) Labs Reviewed - No data to display  EKG  EKG Interpretation None       Radiology No results found.  Procedures Procedures (including critical care time)  Medications Ordered in ED Medications - No data to display   Initial Impression / Assessment and Plan / ED Course  I have reviewed the triage vital signs and the nursing notes.  Pertinent labs & imaging results that were available during my care of the patient were reviewed by me and considered in my medical decision making (see chart for details).     The patient presents with complication of percutaneous drain of having the drainage bag separate from the drain tubing. The patient is in NAD. Abdomen does not appear tender but, per wife, the patient does not report pain.   Chart reviewed. CT scan of 06/09/17 shows the fluid collection of retroperitoneal abscess resolved. No drainage currently. The drain was placed by IR on 06/04/17. Discussed with radiology who advised that no  irrigation necessary, that the drainage bag can simply be replaced or the tube clamped until the patient can follow up with IR.   Wife updated. Drainage bag was able to be replaced. He can be discharged back to [facility].  Final Clinical Impressions(s) / ED Diagnoses   Final diagnoses:  Other complications of procedures, not elsewhere classified, initial encounter    New Prescriptions New Prescriptions   No medications on file     Charlann Lange, Hershal Coria 06/19/17 [place]    Rolland Porter, MD 06/19/17 [phone]

## 2017-06-19 NOTE — ED Notes (Signed)
Bed: [facility] Expected date:  Expected time:  Means of arrival:  Comments: 

## 2017-06-19 NOTE — ED Notes (Signed)
Pt's contact---- Karna Christmas (wife): tel# [phone] (home); [phone] (cell)

## 2017-06-19 NOTE — ED Notes (Signed)
[facility] here to transport pt back to [place].

## 2017-06-23 ENCOUNTER — Ambulatory Visit
Admission: RE | Admit: 2017-06-23 | Discharge: 2017-06-23 | Disposition: A | Discharge status: Home / Self Care | Payer: Medicare Other | Source: Ambulatory Visit | Attending: Radiology | Admitting: Radiology

## 2017-06-23 ENCOUNTER — Ambulatory Visit
Admission: RE | Admit: 2017-06-23 | Discharge: 2017-06-23 | Disposition: A | Discharge status: Home / Self Care | Payer: Medicare Other | Source: Ambulatory Visit | Attending: General Surgery | Admitting: General Surgery

## 2017-06-23 DIAGNOSIS — K6819 Other retroperitoneal abscess: Secondary | ICD-10-CM

## 2017-06-23 HISTORY — PX: IR RADIOLOGIST EVAL & MGMT: IMG5224

## 2017-06-23 MED ORDER — IOPAMIDOL (ISOVUE-300) INJECTION 61%
100.0000 mL | Freq: Once | INTRAVENOUS | Status: AC | PRN
  Administered 2017-06-23: 100 mL via INTRAVENOUS

## 2017-06-23 NOTE — Progress Notes (Signed)
Referring Physician(s): Dr Fredrik Cove  Chief Complaint: The patient is seen in follow up today s/p Technically successful CT-guided retroperitoneal abscess drain catheter placement 06/02/17   History of present illness:  Abscess drain placed 6/27 Wife states she does not flush drain at all Output almost none for a week now Still taking antibiotic Augmentin BID appt with Dr Marlou Starks July 24 Scheduled today for CT and recheck of drain  --- possible drain removal  Past Medical History:  Diagnosis Date  . Adenomatous colon polyp   . Alcohol dependence ([place])   . Ataxia 07/05/2013  . Conjunctivitis, allergic   . 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 Hyperlipemia   . Hypertension   . Memory loss    dementia was in question in 2004  . Peyronie's disease    presumptive,resolved  . Polycythemia vera ([place]) 11/07/2015  . Vitamin B 12 deficiency     Past Surgical History:  Procedure Laterality Date  . FRACTURE SURGERY  1997   leg  . INGUINAL HERNIA REPAIR Bilateral    w/ mesh  . TONSILLECTOMY  1973    Allergies: Patient has no known allergies.  Medications: Prior to Admission medications   Medication Sig Start Date End Date Taking? Authorizing Provider  acetaminophen (TYLENOL) 325 MG tablet Take 2 tablets (650 mg total) by mouth every 6 (six) hours as needed for mild pain (or Fever >/= 101). 06/14/17   Regalado, Belkys A, MD  B COMPLEX VITAMINS PO Take by mouth 3 (three) times daily.    [provider]  feeding supplement (BOOST / RESOURCE BREEZE) LIQD Take 1 Container by mouth 3 (three) times daily between meals. 06/14/17   Regalado, Belkys A, MD  folic acid (FOLVITE) 1 MG tablet Take 1 tablet (1 mg total) by mouth daily. 06/25/15   Thurnell Lose, MD  loratadine (CLARITIN) 10 MG tablet Take 10 mg by mouth daily.    [provider]  rivaroxaban (XARELTO) 20 MG TABS tablet Take 1 tablet (20 mg total) by mouth daily with supper. 09/18/16   Heath Lark, MD  thiamine 100 MG tablet Take 1 tablet (100 mg total) by mouth daily. 06/25/15   Thurnell Lose, MD  traMADol (ULTRAM) 50 MG tablet Take 1 tablet (50 mg total) by mouth every 6 (six) hours as needed for moderate pain. 06/14/17   Regalado, Belkys A, MD  vitamin B-12 (CYANOCOBALAMIN) 1000 MCG tablet Take 1,000 mcg by mouth daily.    [provider]     Family History  Problem Relation Age of Onset  . Glaucoma Mother   . Stroke Father   . Clotting disorder Father   . Clotting disorder Sister     Social History   Social History  . Marital status: Married    Spouse name: N/A  . Number of children: 2  . Years of education: N/A   Occupational History  . [employer]    Social History Main Topics  . Smoking status: Former Smoker    Quit date: 12/07/1972  . Smokeless tobacco: Never Used  . Alcohol use 1.8 oz/week    3 Glasses of wine per week     Comment: 2 glasses of wine daily  . Drug use: No  . Sexual activity: Yes   Other Topics Concern  . Not on file   Social History Narrative  . No narrative on file     Vital Signs: There were no vitals taken for this visit.  Physical Exam  Abdominal: Soft.  Skin: Skin is warm and dry.  Site is clean and dry Sl reddened at tube site No sign of infection  OP in bag is scant and cloudy    Drain injection reveals no communication to bowel Per Dr Barbie Banner Imaging: Ct Abdomen Pelvis W Contrast  Result Date: 06/23/2017 CLINICAL DATA:  Abdominal abscess EXAM: CT ABDOMEN AND PELVIS WITH CONTRAST TECHNIQUE: Multidetector CT imaging of the abdomen and pelvis was performed using the standard protocol following bolus administration of intravenous contrast. CONTRAST:  1109mL ISOVUE-300 IOPAMIDOL (ISOVUE-300) INJECTION 61% COMPARISON:  06/08/2017 FINDINGS: Lower chest: Dependent atelectasis Hepatobiliary: Diffuse hepatic steatosis.  Normal gallbladder. Pancreas: Unremarkable Spleen: Unremarkable Adrenals/Urinary Tract: Adrenal  glands are unremarkable. Kidneys are within normal limits. Stomach/Bowel: There is moderate stool burden throughout the colon. Sigmoid diverticulosis without evidence of acute diverticulitis. The appendix remains inflamed. The abscess adjacent to the appendix contains a pigtail drain. The abscess has resolved. No new abscess. Hematoma within the abdominal wall musculature has nearly resolved. No evidence of small-bowel obstruction. Vascular/Lymphatic: Atherosclerotic calcifications of the aorta and iliac artery's. No abnormal retroperitoneal adenopathy. Reproductive: Prostate is within normal limits. Other: Right inguinal hernia contains adipose tissue. Postop changes from hernia mesh repair are noted. No free-fluid. Musculoskeletal: No vertebral compression deformity. Degenerative changes in the lumbar spine are noted. IMPRESSION: The right lower quadrant abscess strain is stable in position in the abscess cavity remains decompressed Persistent inflammatory change of the appendix consistent with appendicitis. Electronically Signed   By: Marybelle Killings M.D.   On: 06/23/2017 13:20    Labs:  CBC:  Recent Labs  06/10/17 0818 06/11/17 0421 06/12/17 0744 06/14/17 0421  WBC 10.3 10.1 9.1 7.9  HGB 13.3 13.8 14.1 13.8  HCT 39.0 41.4 41.9 41.1  PLT 384 373 358 305    COAGS:  Recent Labs  06/01/17 2147  INR 1.40    BMP:  Recent Labs  06/11/17 0421 06/12/17 0744 06/13/17 0429 06/14/17 0421  NA 144 142 139 140  K 3.3* 3.2* 3.0* 3.5  CL 109 107 105 107  CO2 27 29 25 26   GLUCOSE 141* 126* 132* 129*  BUN 13 9 9 8   CALCIUM 8.7* 8.9 8.7* 8.7*  CREATININE 1.89* 1.76* 1.59* 1.39*  GFRNONAA 35* 39* 44* 51*  GFRAA 41* 45* 51* 59*    LIVER FUNCTION TESTS:  Recent Labs  07/24/16 1525 06/01/17 1219  BILITOT 0.49 0.9  AST 20 38  ALT 17 47  ALKPHOS 61 98  PROT 7.4 7.6  ALBUMIN 3.9 3.4*    Assessment:  Retroperitoneal abscess resolved Drain removed without complication per Dr  Barbie Banner Keep follow up appt with Dr Marlou Starks 7/24  Signed: Kenneth Lax A, PA-C 06/23/2017, 1:36 PM   Please refer to Dr. Barbie Banner attestation of this note for management and plan.

## 2017-06-29 ENCOUNTER — Ambulatory Visit: Payer: Self-pay | Admitting: General Surgery

## 2017-07-21 ENCOUNTER — Encounter: Payer: Self-pay | Admitting: Hematology and Oncology

## 2017-07-21 ENCOUNTER — Telehealth: Payer: Self-pay

## 2017-07-21 NOTE — Telephone Encounter (Signed)
Faxed letter for medical clearance to [facility].

## 2017-07-26 ENCOUNTER — Other Ambulatory Visit (HOSPITAL_COMMUNITY): Payer: Medicare Other

## 2017-07-26 NOTE — Pre-Procedure Instructions (Addendum)
Maxamillian Tienda Brostrom  07/26/2017      [id number] - [place], [place] [address] Phone: [phone] Fax: [phone]  [place], [place] - [id number] [place] [address] Phone: [phone] Fax: [phone]    Your procedure is scheduled on July 29, 2017.  Report to [facility] Admitting at 730 AM.  Call this number if you have problems the morning of surgery:  [phone]   Remember:  Do not eat food or drink liquids after midnight.  Take these medicines the morning of surgery with A SIP OF WATER acetaminophen (tylenol), loratadine (claritin), tramadol (ultram)-if needed for pain  Stop xarelto as instructed by your surgeon  7 days prior to surgery STOP taking any Aspirin, Aleve, Naproxen, Ibuprofen, Motrin, Advil, Goody's, BC's, all herbal medications, fish oil, and all vitamins  Please finish drinking your boost drink by 530 AM day of surgery.   Do not wear jewelry, make-up or nail polish.  Do not wear lotions, powders, or perfumes, or deoderant.  Men may shave face and neck.  Do not bring valuables to the hospital.  [facility] is not responsible for any belongings or valuables.  Contacts, dentures or bridgework may not be worn into surgery.  Leave your suitcase in the car.  After surgery it may be brought to your room.  For patients admitted to the hospital, discharge time will be determined by your treatment team.  Patients discharged the day of surgery will not be allowed to drive home.   Special instructions:   - Preparing For Surgery  Before surgery, you can play an important role. Because skin is not sterile, your skin needs to be as free of germs as possible. You can reduce the number of germs on your skin by washing with CHG (chlorahexidine gluconate) Soap before surgery.  CHG is an antiseptic cleaner which kills germs and bonds with the skin to continue  killing germs even after washing.  Please do not use if you have an allergy to CHG or antibacterial soaps. If your skin becomes reddened/irritated stop using the CHG.  Do not shave (including legs and underarms) for at least 48 hours prior to first CHG shower. It is OK to shave your face.  Please follow these instructions carefully.   1. Shower the NIGHT BEFORE SURGERY and the MORNING OF SURGERY with CHG.   2. If you chose to wash your hair, wash your hair first as usual with your normal shampoo.  3. After you shampoo, rinse your hair and body thoroughly to remove the shampoo.  4. Use CHG as you would any other liquid soap. You can apply CHG directly to the skin and wash gently with a scrungie or a clean washcloth.   5. Apply the CHG Soap to your body ONLY FROM THE NECK DOWN.  Do not use on open wounds or open sores. Avoid contact with your eyes, ears, mouth and genitals (private parts). Wash genitals (private parts) with your normal soap.  6. Wash thoroughly, paying special attention to the area where your surgery will be performed.  7. Thoroughly rinse your body with warm water from the neck down.  8. DO NOT shower/wash with your normal soap after using and rinsing off the CHG Soap.  9. Pat yourself dry with a CLEAN TOWEL.   10. Wear CLEAN PAJAMAS   11. Place CLEAN SHEETS on your bed the night  of your first shower and DO NOT SLEEP WITH PETS.    Day of Surgery: Do not apply any deodorants/lotions. Please wear clean clothes to the hospital/surgery center.    Please read over the following fact sheets that you were given. Pain Booklet, Coughing and Deep Breathing and Surgical Site Infection Prevention

## 2017-07-26 NOTE — Progress Notes (Addendum)
OEU:[id number], John Butts, MD  Cardiologist: pt denies  EKG: 06/01/2017 in EPIC  Stress test: pt denies ever  ECHO: pt denies ever   Cardiac Cath: pt denies ever  Chest x-ray: 06/01/2017 in [facility]

## 2017-07-27 ENCOUNTER — Encounter (HOSPITAL_COMMUNITY): Payer: Self-pay

## 2017-07-27 ENCOUNTER — Encounter (HOSPITAL_COMMUNITY)
Admission: RE | Admit: 2017-07-27 | Discharge: 2017-07-27 | Disposition: A | Discharge status: Home / Self Care | Payer: Medicare Other | Source: Ambulatory Visit | Attending: General Surgery | Admitting: General Surgery

## 2017-07-27 DIAGNOSIS — R4182 Altered mental status, unspecified: Secondary | ICD-10-CM | POA: Diagnosis not present

## 2017-07-27 DIAGNOSIS — I2510 Atherosclerotic heart disease of native coronary artery without angina pectoris: Secondary | ICD-10-CM | POA: Diagnosis not present

## 2017-07-27 DIAGNOSIS — Z7901 Long term (current) use of anticoagulants: Secondary | ICD-10-CM | POA: Diagnosis not present

## 2017-07-27 DIAGNOSIS — E785 Hyperlipidemia, unspecified: Secondary | ICD-10-CM | POA: Diagnosis not present

## 2017-07-27 DIAGNOSIS — F0390 Unspecified dementia without behavioral disturbance: Secondary | ICD-10-CM | POA: Diagnosis not present

## 2017-07-27 DIAGNOSIS — K3580 Unspecified acute appendicitis: Secondary | ICD-10-CM | POA: Diagnosis not present

## 2017-07-27 DIAGNOSIS — Z86711 Personal history of pulmonary embolism: Secondary | ICD-10-CM | POA: Diagnosis not present

## 2017-07-27 DIAGNOSIS — Z86010 Personal history of colonic polyps: Secondary | ICD-10-CM | POA: Diagnosis not present

## 2017-07-27 DIAGNOSIS — Z87891 Personal history of nicotine dependence: Secondary | ICD-10-CM | POA: Diagnosis not present

## 2017-07-27 DIAGNOSIS — D45 Polycythemia vera: Secondary | ICD-10-CM | POA: Diagnosis not present

## 2017-07-27 DIAGNOSIS — Z79899 Other long term (current) drug therapy: Secondary | ICD-10-CM | POA: Diagnosis not present

## 2017-07-27 DIAGNOSIS — I10 Essential (primary) hypertension: Secondary | ICD-10-CM | POA: Diagnosis not present

## 2017-07-27 DIAGNOSIS — K4091 Unilateral inguinal hernia, without obstruction or gangrene, recurrent: Secondary | ICD-10-CM | POA: Diagnosis not present

## 2017-07-27 DIAGNOSIS — I700 Atherosclerosis of aorta: Secondary | ICD-10-CM | POA: Diagnosis not present

## 2017-07-27 HISTORY — DX: Secondary polycythemia: D75.1

## 2017-07-27 LAB — CBC
HEMATOCRIT: 43.1 % (ref 39.0–52.0)
HEMOGLOBIN: 14.1 g/dL (ref 13.0–17.0)
MCH: 30.9 pg (ref 26.0–34.0)
MCHC: 32.7 g/dL (ref 30.0–36.0)
MCV: 94.5 fL (ref 78.0–100.0)
Platelets: 283 10*3/uL (ref 150–400)
RBC: 4.56 MIL/uL (ref 4.22–5.81)
RDW: 14.7 % (ref 11.5–15.5)
WBC: 9 10*3/uL (ref 4.0–10.5)

## 2017-07-27 LAB — COMPREHENSIVE METABOLIC PANEL
ALK PHOS: 60 U/L (ref 38–126)
ALT: 17 U/L (ref 17–63)
AST: 20 U/L (ref 15–41)
Albumin: 3.5 g/dL (ref 3.5–5.0)
Anion gap: 7 (ref 5–15)
BILIRUBIN TOTAL: 0.5 mg/dL (ref 0.3–1.2)
BUN: 12 mg/dL (ref 6–20)
CO2: 28 mmol/L (ref 22–32)
Calcium: 9.4 mg/dL (ref 8.9–10.3)
Chloride: 102 mmol/L (ref 101–111)
Creatinine, Ser: 1.04 mg/dL (ref 0.61–1.24)
GFR calc Af Amer: >60 mL/min (ref >60)
GFR calc non Af Amer: >60 mL/min (ref >60)
GLUCOSE: 110 mg/dL — AB (ref 65–99)
POTASSIUM: 3.8 mmol/L (ref 3.5–5.1)
SODIUM: 137 mmol/L (ref 135–145)
TOTAL PROTEIN: 6.9 g/dL (ref 6.5–8.1)

## 2017-07-27 LAB — APTT: aPTT: 29 seconds (ref 24–36)

## 2017-07-27 LAB — PROTIME-INR
INR: 1.13
Prothrombin Time: 14.6 seconds (ref 11.4–15.2)

## 2017-07-28 MED ORDER — ACETAMINOPHEN 500 MG PO TABS
1000.0000 mg | ORAL_TABLET | ORAL | Status: DC
  Filled 2017-07-28: qty 2

## 2017-07-28 MED ORDER — GABAPENTIN 300 MG PO CAPS
300.0000 mg | ORAL_CAPSULE | ORAL | Status: DC
  Filled 2017-07-28: qty 1

## 2017-07-28 MED ORDER — CEFOTETAN DISODIUM-DEXTROSE 2-2.08 GM-% IV SOLR
2.0000 g | INTRAVENOUS | Status: AC
  Administered 2017-07-29: 2 g via INTRAVENOUS
  Filled 2017-07-28: qty 50

## 2017-07-29 ENCOUNTER — Ambulatory Visit (HOSPITAL_COMMUNITY)
Admission: RE | Admit: 2017-07-29 | Discharge: 2017-07-29 | Disposition: A | Discharge status: Home / Self Care | Payer: Medicare Other | Source: Ambulatory Visit | Attending: General Surgery | Admitting: General Surgery

## 2017-07-29 ENCOUNTER — Ambulatory Visit (HOSPITAL_COMMUNITY): Payer: Medicare Other | Admitting: Certified Registered Nurse Anesthetist

## 2017-07-29 ENCOUNTER — Encounter (HOSPITAL_COMMUNITY): Payer: Self-pay | Admitting: Certified Registered Nurse Anesthetist

## 2017-07-29 ENCOUNTER — Encounter (HOSPITAL_COMMUNITY)
Admission: RE | Disposition: A | Discharge status: Home / Self Care | Payer: Self-pay | Source: Ambulatory Visit | Attending: General Surgery

## 2017-07-29 DIAGNOSIS — Z86010 Personal history of colonic polyps: Secondary | ICD-10-CM | POA: Insufficient documentation

## 2017-07-29 DIAGNOSIS — F0390 Unspecified dementia without behavioral disturbance: Secondary | ICD-10-CM | POA: Diagnosis not present

## 2017-07-29 DIAGNOSIS — D45 Polycythemia vera: Secondary | ICD-10-CM | POA: Insufficient documentation

## 2017-07-29 DIAGNOSIS — K3580 Unspecified acute appendicitis: Secondary | ICD-10-CM | POA: Diagnosis not present

## 2017-07-29 DIAGNOSIS — Z86711 Personal history of pulmonary embolism: Secondary | ICD-10-CM | POA: Insufficient documentation

## 2017-07-29 DIAGNOSIS — E785 Hyperlipidemia, unspecified: Secondary | ICD-10-CM | POA: Insufficient documentation

## 2017-07-29 DIAGNOSIS — Z79899 Other long term (current) drug therapy: Secondary | ICD-10-CM | POA: Insufficient documentation

## 2017-07-29 DIAGNOSIS — R4182 Altered mental status, unspecified: Secondary | ICD-10-CM | POA: Insufficient documentation

## 2017-07-29 DIAGNOSIS — I10 Essential (primary) hypertension: Secondary | ICD-10-CM | POA: Insufficient documentation

## 2017-07-29 DIAGNOSIS — I2510 Atherosclerotic heart disease of native coronary artery without angina pectoris: Secondary | ICD-10-CM | POA: Insufficient documentation

## 2017-07-29 DIAGNOSIS — Z7901 Long term (current) use of anticoagulants: Secondary | ICD-10-CM | POA: Insufficient documentation

## 2017-07-29 DIAGNOSIS — I700 Atherosclerosis of aorta: Secondary | ICD-10-CM | POA: Insufficient documentation

## 2017-07-29 DIAGNOSIS — Z87891 Personal history of nicotine dependence: Secondary | ICD-10-CM | POA: Insufficient documentation

## 2017-07-29 DIAGNOSIS — K4091 Unilateral inguinal hernia, without obstruction or gangrene, recurrent: Secondary | ICD-10-CM | POA: Insufficient documentation

## 2017-07-29 HISTORY — PX: LAPAROSCOPIC APPENDECTOMY: SHX408

## 2017-07-29 SURGERY — APPENDECTOMY, LAPAROSCOPIC
Anesthesia: General | Site: Abdomen

## 2017-07-29 MED ORDER — FENTANYL CITRATE (PF) 100 MCG/2ML IJ SOLN
INTRAMUSCULAR | Status: DC | PRN
  Administered 2017-07-29: 50 ug via INTRAVENOUS
  Administered 2017-07-29: 100 ug via INTRAVENOUS
  Administered 2017-07-29: 50 ug via INTRAVENOUS

## 2017-07-29 MED ORDER — MIDAZOLAM HCL 2 MG/2ML IJ SOLN
INTRAMUSCULAR | Status: AC
  Filled 2017-07-29: qty 2

## 2017-07-29 MED ORDER — SUGAMMADEX SODIUM 200 MG/2ML IV SOLN
INTRAVENOUS | Status: DC | PRN
  Administered 2017-07-29: 150 mg via INTRAVENOUS

## 2017-07-29 MED ORDER — LACTATED RINGERS IV SOLN
INTRAVENOUS | Status: DC
  Administered 2017-07-29: 09:00:00 via INTRAVENOUS

## 2017-07-29 MED ORDER — LIDOCAINE HCL (CARDIAC) 20 MG/ML IV SOLN
INTRAVENOUS | Status: DC | PRN
  Administered 2017-07-29: 60 mg via INTRAVENOUS

## 2017-07-29 MED ORDER — 0.9 % SODIUM CHLORIDE (POUR BTL) OPTIME
TOPICAL | Status: DC | PRN
  Administered 2017-07-29: 1000 mL

## 2017-07-29 MED ORDER — ONDANSETRON HCL 4 MG/2ML IJ SOLN
INTRAMUSCULAR | Status: DC | PRN
  Administered 2017-07-29: 4 mg via INTRAVENOUS

## 2017-07-29 MED ORDER — BUPIVACAINE LIPOSOME 1.3 % IJ SUSP
20.0000 mL | INTRAMUSCULAR | Status: AC
  Administered 2017-07-29: 20 mL
  Filled 2017-07-29: qty 20

## 2017-07-29 MED ORDER — OXYCODONE HCL 5 MG PO TABS: 5.0000 mg | ORAL_TABLET | Freq: Once | ORAL | Status: DC | PRN

## 2017-07-29 MED ORDER — PROPOFOL 10 MG/ML IV BOLUS
INTRAVENOUS | Status: DC | PRN
  Administered 2017-07-29: 150 mg via INTRAVENOUS

## 2017-07-29 MED ORDER — DEXAMETHASONE SODIUM PHOSPHATE 10 MG/ML IJ SOLN
INTRAMUSCULAR | Status: DC | PRN
  Administered 2017-07-29: 5 mg via INTRAVENOUS

## 2017-07-29 MED ORDER — FENTANYL CITRATE (PF) 250 MCG/5ML IJ SOLN
INTRAMUSCULAR | Status: AC
  Filled 2017-07-29: qty 5

## 2017-07-29 MED ORDER — BUPIVACAINE-EPINEPHRINE 0.25% -1:200000 IJ SOLN
INTRAMUSCULAR | Status: DC | PRN
  Administered 2017-07-29: 17 mL

## 2017-07-29 MED ORDER — ROCURONIUM BROMIDE 100 MG/10ML IV SOLN
INTRAVENOUS | Status: DC | PRN
  Administered 2017-07-29: 50 mg via INTRAVENOUS

## 2017-07-29 MED ORDER — EPHEDRINE SULFATE 50 MG/ML IJ SOLN
INTRAMUSCULAR | Status: DC | PRN
  Administered 2017-07-29 (×2): 15 mg via INTRAVENOUS
  Administered 2017-07-29 (×2): 10 mg via INTRAVENOUS

## 2017-07-29 MED ORDER — PROPOFOL 10 MG/ML IV BOLUS
INTRAVENOUS | Status: AC
  Filled 2017-07-29: qty 20

## 2017-07-29 MED ORDER — CHLORHEXIDINE GLUCONATE CLOTH 2 % EX PADS: 6.0000 | MEDICATED_PAD | Freq: Once | CUTANEOUS | Status: DC

## 2017-07-29 MED ORDER — HYDROCODONE-ACETAMINOPHEN 5-325 MG PO TABS: 1.0000 | ORAL_TABLET | ORAL | 0 refills | Status: DC | PRN

## 2017-07-29 MED ORDER — ONDANSETRON HCL 4 MG/2ML IJ SOLN: 4.0000 mg | Freq: Four times a day (QID) | INTRAMUSCULAR | Status: DC | PRN

## 2017-07-29 MED ORDER — BUPIVACAINE-EPINEPHRINE (PF) 0.25% -1:200000 IJ SOLN
INTRAMUSCULAR | Status: AC
  Filled 2017-07-29: qty 30

## 2017-07-29 MED ORDER — SODIUM CHLORIDE 0.9 % IR SOLN
Status: DC | PRN
  Administered 2017-07-29: 1000 mL

## 2017-07-29 MED ORDER — OXYCODONE HCL 5 MG/5ML PO SOLN: 5.0000 mg | Freq: Once | ORAL | Status: DC | PRN

## 2017-07-29 MED ORDER — MIDAZOLAM HCL 5 MG/5ML IJ SOLN
INTRAMUSCULAR | Status: DC | PRN
  Administered 2017-07-29: 1 mg via INTRAVENOUS

## 2017-07-29 MED ORDER — FENTANYL CITRATE (PF) 100 MCG/2ML IJ SOLN: 25.0000 ug | INTRAMUSCULAR | Status: DC | PRN

## 2017-07-29 SURGICAL SUPPLY — 39 items
ADH SKN CLS APL DERMABOND .7 (GAUZE/BANDAGES/DRESSINGS) ×1
APPLIER CLIP ROT 10 11.4 M/L (STAPLE) ×3
APR CLP MED LRG 11.4X10 (STAPLE) ×1
BAG SPEC RTRVL LRG 6X4 10 (ENDOMECHANICALS) ×1
BLADE CLIPPER SURG (BLADE) IMPLANT
CANISTER SUCT 3000ML PPV (MISCELLANEOUS) ×3 IMPLANT
CHLORAPREP W/TINT 26ML (MISCELLANEOUS) ×3 IMPLANT
CLIP APPLIE ROT 10 11.4 M/L (STAPLE) IMPLANT
COVER SURGICAL LIGHT HANDLE (MISCELLANEOUS) ×3 IMPLANT
CUTTER FLEX LINEAR 45M (STAPLE) ×3 IMPLANT
DERMABOND ADVANCED (GAUZE/BANDAGES/DRESSINGS) ×2
DERMABOND ADVANCED .7 DNX12 (GAUZE/BANDAGES/DRESSINGS) ×1 IMPLANT
ELECT REM PT RETURN 9FT ADLT (ELECTROSURGICAL) ×3
ELECTRODE REM PT RTRN 9FT ADLT (ELECTROSURGICAL) ×1 IMPLANT
ENDOLOOP SUT PDS II  0 18 (SUTURE)
ENDOLOOP SUT PDS II 0 18 (SUTURE) IMPLANT
GLOVE BIO SURGEON STRL SZ7.5 (GLOVE) ×3 IMPLANT
GOWN STRL REUS W/ TWL LRG LVL3 (GOWN DISPOSABLE) ×3 IMPLANT
GOWN STRL REUS W/TWL LRG LVL3 (GOWN DISPOSABLE) ×9
KIT BASIN OR (CUSTOM PROCEDURE TRAY) ×3 IMPLANT
KIT ROOM TURNOVER OR (KITS) ×3 IMPLANT
NS IRRIG 1000ML POUR BTL (IV SOLUTION) ×3 IMPLANT
PAD ARMBOARD 7.5X6 YLW CONV (MISCELLANEOUS) ×6 IMPLANT
POUCH SPECIMEN RETRIEVAL 10MM (ENDOMECHANICALS) ×3 IMPLANT
RELOAD STAPLE 45 3.5 BLU ETS (ENDOMECHANICALS) ×1 IMPLANT
RELOAD STAPLE TA45 3.5 REG BLU (ENDOMECHANICALS) ×6 IMPLANT
SCISSORS LAP 5X35 DISP (ENDOMECHANICALS) ×2 IMPLANT
SET IRRIG TUBING LAPAROSCOPIC (IRRIGATION / IRRIGATOR) ×3 IMPLANT
SHEARS HARMONIC ACE PLUS 36CM (ENDOMECHANICALS) ×3 IMPLANT
SLEEVE ENDOPATH XCEL 5M (ENDOMECHANICALS) ×2 IMPLANT
SPECIMEN JAR SMALL (MISCELLANEOUS) ×3 IMPLANT
SUT MNCRL AB 4-0 PS2 18 (SUTURE) ×3 IMPLANT
TOWEL OR 17X24 6PK STRL BLUE (TOWEL DISPOSABLE) ×3 IMPLANT
TOWEL OR 17X26 10 PK STRL BLUE (TOWEL DISPOSABLE) ×3 IMPLANT
TRAY FOLEY CATH SILVER 16FR (SET/KITS/TRAYS/PACK) ×3 IMPLANT
TRAY LAPAROSCOPIC MC (CUSTOM PROCEDURE TRAY) ×3 IMPLANT
TROCAR XCEL BLUNT TIP 100MML (ENDOMECHANICALS) ×3 IMPLANT
TROCAR XCEL NON-BLD 5MMX100MML (ENDOMECHANICALS) ×6 IMPLANT
TUBING INSUFFLATION (TUBING) ×3 IMPLANT
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
## 2017-07-29 NOTE — Interval H&P Note (Signed)
History and Physical Interval Note:  07/29/2017 9:04 AM  John [name]  has presented today for surgery, with the diagnosis of appendicitis  The various methods of treatment have been discussed with the patient and family. After consideration of risks, benefits and other options for treatment, the patient has consented to  Procedure(s): LAPAROSCOPIC APPENDECTOMY POSSIBLE OPEN (N/A) as a surgical intervention .  The patient's history has been reviewed, patient examined, no change in status, stable for surgery.  I have reviewed the patient's chart and labs.  Questions were answered to the patient's satisfaction.     TOTH III,PAUL S

## 2017-07-29 NOTE — Transfer of Care (Signed)
Immediate Anesthesia Transfer of Care Note  Patient: John [name]  Procedure(s) Performed: Procedure(s): LAPAROSCOPIC APPENDECTOMY (N/A)  Patient Location: PACU  Anesthesia Type:General  Level of Consciousness: awake, alert , oriented and patient cooperative  Airway & Oxygen Therapy: Patient Spontanous Breathing  Post-op Assessment: Report given to RN and Post -op Vital signs reviewed and stable  Post vital signs: Reviewed and stable  Last Vitals:  Vitals:   07/29/17 0809  BP: 138/82  Pulse: 62  Resp: 18  Temp: 36.7 C  SpO2: 100%    Last Pain:  Vitals:   07/29/17 0809  TempSrc: Oral      Patients Stated Pain Goal: 0 (24/46/95 0722)  Complications: No apparent anesthesia complications

## 2017-07-29 NOTE — Anesthesia Preprocedure Evaluation (Signed)
Anesthesia Evaluation  Patient identified by MRN, date of birth, ID band Patient awake    Reviewed: Allergy & Precautions, H&P , NPO status , Patient's Chart, lab work & pertinent test results  Airway Mallampati: II   Neck ROM: full    Dental   Pulmonary shortness of breath, former smoker,    breath sounds clear to auscultation       Cardiovascular hypertension,  Rhythm:regular Rate:Normal     Neuro/Psych dementia   GI/Hepatic   Endo/Other    Renal/GU      Musculoskeletal   Abdominal   Peds  Hematology Polycythemia vera   Anesthesia Other Findings   Reproductive/Obstetrics                             Anesthesia Physical Anesthesia Plan  ASA: III  Anesthesia Plan: General   Post-op Pain Management:    Induction: Intravenous  PONV Risk Score and Plan: 3 and Ondansetron, Dexamethasone and Treatment may vary due to age or medical condition  Airway Management Planned: Oral ETT  Additional Equipment:   Intra-op Plan:   Post-operative Plan: Extubation in OR  Informed Consent: I have reviewed the patients History and Physical, chart, labs and discussed the procedure including the risks, benefits and alternatives for the proposed anesthesia with the patient or authorized representative who has indicated his/her understanding and acceptance.     Plan Discussed with: CRNA, Anesthesiologist and Surgeon  Anesthesia Plan Comments:         Anesthesia Quick Evaluation

## 2017-07-29 NOTE — Op Note (Addendum)
07/29/2017  10:38 AM  PATIENT:  John [name]  66 y.o. male  PRE-OPERATIVE DIAGNOSIS:  History of appendicitis  POST-OPERATIVE DIAGNOSIS:  History of appendicitis  PROCEDURE:  Procedure(s): LAPAROSCOPIC APPENDECTOMY (N/A)  SURGEON:  Surgeon(s) and Role:    * Jovita Kussmaul, MD - Primary  PHYSICIAN ASSISTANT:   ASSISTANTS: Judyann Munson, RNFA   ANESTHESIA:   local and general  EBL:  Total I/O In: 900 [I.V.:900] Out: 170 [Urine:150; Blood:20]  BLOOD ADMINISTERED:none  DRAINS: none   LOCAL MEDICATIONS USED:  MARCAINE     SPECIMEN:  Source of Specimen:  appendix  DISPOSITION OF SPECIMEN:  PATHOLOGY  COUNTS:  YES  TOURNIQUET:  * No tourniquets in log *  DICTATION: .Dragon Dictation   After informed consent was obtained patient was brought to the operating room placed in the supine position on the operating room table. After adequate induction of general anesthesia the patient's abdomen was prepped with ChloraPrep, allowed to dry, and draped in usual sterile manner. The area below the umbilicus was infiltrated with quarter percent Marcaine. A small incision was made with a 15 blade knife. This incision was carried down through the subcutaneous tissue bluntly with a hemostat and [employer] retractors until the linea alba was identified. The linea alba was incised with a 15 blade knife. Each side was grasped Coker clamps and elevated anteriorly. The preperitoneal space was probed bluntly with a hemostat until the peritoneum was opened and access was gained to the abdominal cavity. A 0 Vicryl purse string stitch was placed in the fascia surrounding the opening. A Hassan cannula was placed through the opening and anchored in place with the previously placed Vicryl purse string stitch. The laparoscope was placed through the [facility] cannula. The abdomen was insufflated with carbon dioxide without difficulty. Next the suprapubic area was infiltrated with quarter percent Marcaine. A  small incision was made with a 15 blade knife. A 5 mm port was placed bluntly through this incision into the abdominal cavity. A site was then chosen between the 2 port for placement of a 5 mm port. The area was infiltrated with quarter percent Marcaine. A small stab incision was made with a 15 blade knife. A 5 mm port was placed bluntly through this incision and the abdominal cavity under direct vision. The laparoscope was then moved to the suprapubic port. Using a Glassman grasper and harmonic scalpel the right lower quadrant was inspected. The appendix was readily identified. The appendix was elevated anteriorly and the mesoappendix was taken down sharply with the harmonic scalpel. The end of the appendix was densely stuck to the abdominal sidewall and this was dissected free with the harmonic scalpel. Once the base of the appendix where it joined the cecum was identified and cleared of any tissue then a laparoscopic GIA blue load 6 row stapler was placed through the [facility] cannula. The stapler was placed across the base of the appendix clamped and fired twice thereby dividing the base of the appendix between staple lines. A laparoscopic bag was then inserted through the [facility] cannula. The appendix was placed within the bag and the bag was sealed. The abdomen was then irrigated with copious amounts of saline until the effluent was clear. No other abnormalities were noted. The appendix and bag were removed with the [facility] cannula through the infraumbilical port without difficulty. There was a hernia at the umbilical port site. The fascial defect was closed with the previously placed Vicryl pursestring stitch as well as with another  interrupted 0 Vicryl figure-of-eight stitch. The rest of the ports were removed under direct vision and were found to be hemostatic. The gas was allowed to escape. The incision sites were infiltrated with exparel.  The skin incisions were closed with interrupted 4-0 Monocryl  subcuticular stitches. Dermabond dressings were applied. The patient tolerated the procedure well. At the end of the case all needle sponge and instrument counts were correct. The patient was then awakened and taken to recovery in stable condition.  PLAN OF CARE: Discharge to home after PACU  PATIENT DISPOSITION:  PACU - hemodynamically stable.   Delay start of Pharmacological VTE agent (>24hrs) due to surgical blood loss or risk of bleeding: not applicable

## 2017-07-29 NOTE — Anesthesia Procedure Notes (Signed)
Procedure Name: Intubation Date/Time: 07/29/2017 9:26 AM Performed by: Salli Quarry Shanell Aden Pre-anesthesia Checklist: Patient identified, Emergency Drugs available, Suction available and Patient being monitored Patient Re-evaluated:Patient Re-evaluated prior to induction Oxygen Delivery Method: Circle System Utilized Preoxygenation: Pre-oxygenation with 100% oxygen Induction Type: IV induction Ventilation: Mask ventilation without difficulty Laryngoscope Size: Mac and 4 Grade View: Grade I Tube type: Oral Tube size: 7.5 mm Number of attempts: 1 Airway Equipment and Method: Stylet and Oral airway Placement Confirmation: ETT inserted through vocal cords under direct vision,  positive ETCO2 and breath sounds checked- equal and bilateral Secured at: 23 cm Tube secured with: Tape Dental Injury: Teeth and Oropharynx as per pre-operative assessment

## 2017-07-29 NOTE — Anesthesia Postprocedure Evaluation (Signed)
Anesthesia Post Note  Patient: John [name]  Procedure(s) Performed: Procedure(s) (LRB): LAPAROSCOPIC APPENDECTOMY (N/A)     Patient location during evaluation: PACU Anesthesia Type: General Level of consciousness: awake and alert and patient cooperative Pain management: pain level controlled Vital Signs Assessment: post-procedure vital signs reviewed and stable Respiratory status: spontaneous breathing and respiratory function stable Cardiovascular status: stable Anesthetic complications: no    Last Vitals:  Vitals:   07/29/17 1130 07/29/17 1145  BP: 130/78 134/72  Pulse: 82 85  Resp: 20 18  Temp:  36.7 C  SpO2: 96% 98%    Last Pain:  Vitals:   07/29/17 1145  TempSrc:   PainSc: 0-No pain                 Sacred Roa S

## 2017-07-30 ENCOUNTER — Encounter (HOSPITAL_COMMUNITY): Payer: Self-pay | Admitting: General Surgery

## 2017-08-03 ENCOUNTER — Telehealth: Payer: Self-pay | Admitting: Hematology and Oncology

## 2017-08-03 NOTE — Telephone Encounter (Signed)
Spoke with patient's wife regarding changing his appt to 10/15.

## 2017-09-20 ENCOUNTER — Encounter: Payer: Self-pay | Admitting: Interventional Radiology

## 2017-09-20 ENCOUNTER — Other Ambulatory Visit (HOSPITAL_BASED_OUTPATIENT_CLINIC_OR_DEPARTMENT_OTHER): Payer: Medicare Other

## 2017-09-20 ENCOUNTER — Telehealth: Payer: Self-pay | Admitting: Hematology and Oncology

## 2017-09-20 ENCOUNTER — Other Ambulatory Visit: Payer: Self-pay | Admitting: Hematology and Oncology

## 2017-09-20 ENCOUNTER — Ambulatory Visit (HOSPITAL_BASED_OUTPATIENT_CLINIC_OR_DEPARTMENT_OTHER): Payer: Medicare Other | Admitting: Hematology and Oncology

## 2017-09-20 VITALS — BP 129/78 | HR 80 | Temp 98.2°F | Resp 17 | Ht 68.0 in | Wt 159.2 lb

## 2017-09-20 DIAGNOSIS — Z86711 Personal history of pulmonary embolism: Secondary | ICD-10-CM

## 2017-09-20 DIAGNOSIS — D45 Polycythemia vera: Secondary | ICD-10-CM | POA: Diagnosis not present

## 2017-09-20 DIAGNOSIS — F0390 Unspecified dementia without behavioral disturbance: Secondary | ICD-10-CM | POA: Diagnosis not present

## 2017-09-20 DIAGNOSIS — Z23 Encounter for immunization: Secondary | ICD-10-CM

## 2017-09-20 LAB — CBC WITH DIFFERENTIAL/PLATELET
BASO%: 0.2 % (ref 0.0–2.0)
Basophils Absolute: 0 10*3/uL (ref 0.0–0.1)
EOS ABS: 0.1 10*3/uL (ref 0.0–0.5)
EOS%: 1.6 % (ref 0.0–7.0)
HCT: 43.3 % (ref 38.4–49.9)
HGB: 14.3 g/dL (ref 13.0–17.1)
LYMPH%: 25.3 % (ref 14.0–49.0)
MCH: 32.1 pg (ref 27.2–33.4)
MCHC: 33 g/dL (ref 32.0–36.0)
MCV: 97.1 fL (ref 79.3–98.0)
MONO#: 0.6 10*3/uL (ref 0.1–0.9)
MONO%: 7.6 % (ref 0.0–14.0)
NEUT%: 65.3 % (ref 39.0–75.0)
NEUTROS ABS: 5.3 10*3/uL (ref 1.5–6.5)
Platelets: 250 10*3/uL (ref 140–400)
RBC: 4.46 10*6/uL (ref 4.20–5.82)
RDW: 14.4 % (ref 11.0–14.6)
WBC: 8.1 10*3/uL (ref 4.0–10.3)
lymph#: 2 10*3/uL (ref 0.9–3.3)

## 2017-09-20 MED ORDER — INFLUENZA VAC SPLIT QUAD 0.5 ML IM SUSY
0.5000 mL | PREFILLED_SYRINGE | Freq: Once | INTRAMUSCULAR | Status: AC
  Administered 2017-09-20: 0.5 mL via INTRAMUSCULAR
  Filled 2017-09-20: qty 0.5

## 2017-09-20 NOTE — Progress Notes (Signed)
[facility] OFFICE PROGRESS NOTE  Patient Care Team: Cari Caraway, MD as PCP - General (Family Medicine)  SUMMARY OF ONCOLOGIC HISTORY:   Polycythemia vera ([place])   11/07/2015 Pathology Results    Peripheral blood for JAK 2 mutation is negative       INTERVAL HISTORY: Please see below for problem oriented charting. 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  REVIEW OF SYSTEMS:   Constitutional: Denies fevers, chills or abnormal weight loss Eyes: Denies blurriness of vision Ears, nose, mouth, throat, and face: Denies mucositis or sore throat Respiratory: Denies cough, dyspnea or wheezes Cardiovascular: Denies palpitation, chest discomfort Gastrointestinal:  Denies nausea, heartburn or change in bowel habits Skin: Denies abnormal skin rashes Lymphatics: Denies new lymphadenopathy or easy bruising Neurological:Denies numbness, tingling or new weaknesses Behavioral/Psych: Mood is stable, no new changes  All other systems were reviewed with the patient and are negative.  I have reviewed the past medical history, past surgical history, social history and family history with the patient and they are unchanged from previous note.  ALLERGIES:  is allergic to flonase [fluticasone].  MEDICATIONS:  Current Outpatient Prescriptions  Medication Sig Dispense Refill  . B COMPLEX VITAMINS PO Take by mouth 3 (three) times daily.     . folic acid (FOLVITE) 1 MG tablet Take 1 tablet (1 mg total) by mouth daily. 30 tablet 0  . HYDROcodone-acetaminophen (NORCO/VICODIN) 5-325 MG tablet Take 1-2 tablets by mouth every 4 (four) hours as needed for moderate pain or severe pain. 20 tablet 0  . loratadine (CLARITIN) 10 MG tablet Take 10 mg by mouth daily as needed for allergies.     Marland Kitchen QUEtiapine (SEROQUEL) 25 MG tablet Take 25 mg by mouth 3 (three) times daily as needed for agitation.  0  . rivaroxaban (XARELTO) 20 MG TABS tablet Take 1 tablet (20 mg total) by mouth daily with supper. 30 tablet 11  . thiamine 100 MG tablet Take 1 tablet (100 mg total) by mouth daily. 30 tablet 0  . traMADol (ULTRAM) 50 MG tablet Take 1 tablet (50 mg total) by mouth every 6 (six) hours as needed for moderate pain. 30 tablet 0  . vitamin B-12 (CYANOCOBALAMIN) 1000 MCG tablet Take 1,000 mcg by mouth daily.     No current facility-administered medications for this visit.     PHYSICAL EXAMINATION: ECOG PERFORMANCE STATUS: 1 - Symptomatic but completely ambulatory  Vitals:   09/20/17 1533  BP: 129/78  Pulse: 80  Resp: 17  Temp: 98.2 F (36.8 C)  SpO2: 98%   Filed Weights   09/20/17 1533  Weight: 159 lb 3.2 oz (72.2 kg)    GENERAL:alert, no distress and comfortable SKIN: skin color, texture, turgor are normal, no rashes or significant lesions EYES: normal, Conjunctiva are pink and non-injected, sclera clear OROPHARYNX:no exudate, no erythema and lips, buccal mucosa, and tongue normal  NECK: supple, thyroid normal size, non-tender, without nodularity LYMPH:  no palpable lymphadenopathy in the cervical, axillary or  inguinal LUNGS: clear to auscultation and percussion with normal breathing effort HEART: regular rate & rhythm and no murmurs and no lower extremity edema ABDOMEN:abdomen soft, non-tender and normal bowel sounds Musculoskeletal:no cyanosis of digits and no clubbing  NEURO: alert & oriented x 3 with minimum speech, unable to  assess for weakness but noted mild edema on the right side, likely due to lack of tone from traumatic brain injury  LABORATORY DATA:  I have reviewed the data as listed    Component Value Date/Time   NA 137 07/27/2017 1453   NA 141 07/24/2016 1525   K 3.8 07/27/2017 1453   K 4.4 07/24/2016 1525   CL 102 07/27/2017 1453   CO2 28 07/27/2017 1453   CO2 27 07/24/2016 1525   GLUCOSE 110 (H) 07/27/2017 1453   GLUCOSE 95 07/24/2016 1525   BUN 12 07/27/2017 1453   BUN 14.8 07/24/2016 1525   CREATININE 1.04 07/27/2017 1453   CREATININE 0.9 07/24/2016 1525   CALCIUM 9.4 07/27/2017 1453   CALCIUM 10.0 07/24/2016 1525   PROT 6.9 07/27/2017 1453   PROT 7.4 07/24/2016 1525   ALBUMIN 3.5 07/27/2017 1453   ALBUMIN 3.9 07/24/2016 1525   AST 20 07/27/2017 1453   AST 20 07/24/2016 1525   ALT 17 07/27/2017 1453   ALT 17 07/24/2016 1525   ALKPHOS 60 07/27/2017 1453   ALKPHOS 61 07/24/2016 1525   BILITOT 0.5 07/27/2017 1453   BILITOT 0.49 07/24/2016 1525   GFRNONAA >60 07/27/2017 1453   GFRAA >60 07/27/2017 1453    No results found for: SPEP, UPEP  Lab Results  Component Value Date   WBC 8.1 09/20/2017   NEUTROABS 5.3 09/20/2017   HGB 14.3 09/20/2017   HCT 43.3 09/20/2017   MCV 97.1 09/20/2017   PLT 250 09/20/2017      Chemistry      Component Value Date/Time   NA 137 07/27/2017 1453   NA 141 07/24/2016 1525   K 3.8 07/27/2017 1453   K 4.4 07/24/2016 1525   CL 102 07/27/2017 1453   CO2 28 07/27/2017 1453   CO2 27 07/24/2016 1525   BUN 12 07/27/2017 1453   BUN 14.8 07/24/2016 1525   CREATININE 1.04 07/27/2017 1453   CREATININE 0.9 07/24/2016 1525      Component Value Date/Time   CALCIUM 9.4 07/27/2017 1453   CALCIUM 10.0 07/24/2016 1525   ALKPHOS 60 07/27/2017 1453   ALKPHOS 61 07/24/2016 1525   AST 20 07/27/2017 1453   AST 20 07/24/2016 1525   ALT 17 07/27/2017 1453   ALT 17 07/24/2016 1525   BILITOT 0.5 07/27/2017 1453   BILITOT 0.49 07/24/2016 1525      ASSESSMENT & PLAN:  Polycythemia vera ([place]) He has not required phlebotomy since a year ago. CBC remained stable I recommend recheck CBC with primary care doctor within 6 months and I will see him back in a year We will cancel his phlebotomy session today We discussed the importance of preventive care and reviewed the vaccination programs. He does not have any prior allergic reactions to influenza vaccination. He agrees to proceed with influenza vaccination today and we will administer it today at the clinic.    History of pulmonary embolism He is doing well on anticoagulation therapy. We discussed the risks and benefits of continuing anticoagulation therapy versus stopping. He has family history of clotting disorder and possible undiagnosed myeloproliferative disorder that would place him at very high risks of recurrence of blood clot  if we stop Rx. Recommendation is to proceed with treatment indefinitely He is doing well without bleeding complications   No orders of the defined types were placed in this encounter.  All questions were answered. The patient knows to call the clinic with any problems, questions or concerns. No barriers to learning was detected. I spent 10 minutes counseling the patient face to face. The total time spent in the appointment was 15 minutes and more than 50% was on counseling and review of test results     Heath Lark, MD 09/20/2017 3:42 PM

## 2017-09-20 NOTE — Assessment & Plan Note (Signed)
He is doing well on anticoagulation therapy. We discussed the risks and benefits of continuing anticoagulation therapy versus stopping. He has family history of clotting disorder and possible undiagnosed myeloproliferative disorder that would place him at very high risks of recurrence of blood clot if we stop Rx. Recommendation is to proceed with treatment indefinitely He is doing well without bleeding complications

## 2017-09-20 NOTE — Assessment & Plan Note (Addendum)
He has not required phlebotomy since a year ago. CBC remained stable I recommend recheck CBC with primary care doctor within 6 months and I will see him back in a year We will cancel his phlebotomy session today We discussed the importance of preventive care and reviewed the vaccination programs. He does not have any prior allergic reactions to influenza vaccination. He agrees to proceed with influenza vaccination today and we will administer it today at the clinic.

## 2017-09-20 NOTE — Telephone Encounter (Signed)
Scheduled appt per 10/15 los - Gave patient AVS and calender per los.  

## 2017-09-24 ENCOUNTER — Ambulatory Visit: Payer: Medicare Other | Admitting: Hematology and Oncology

## 2017-09-24 ENCOUNTER — Other Ambulatory Visit: Payer: Medicare Other

## 2017-11-21 IMAGING — RF DG SINUS / FISTULA TRACT / ABSCESSOGRAM
3 series · 9 of 9 positions shown · non-contrast
Comparison: none

INDICATION: Abscess injection

[Series 1: one shot · 1 of 1 slices shown]
[im 1/1]
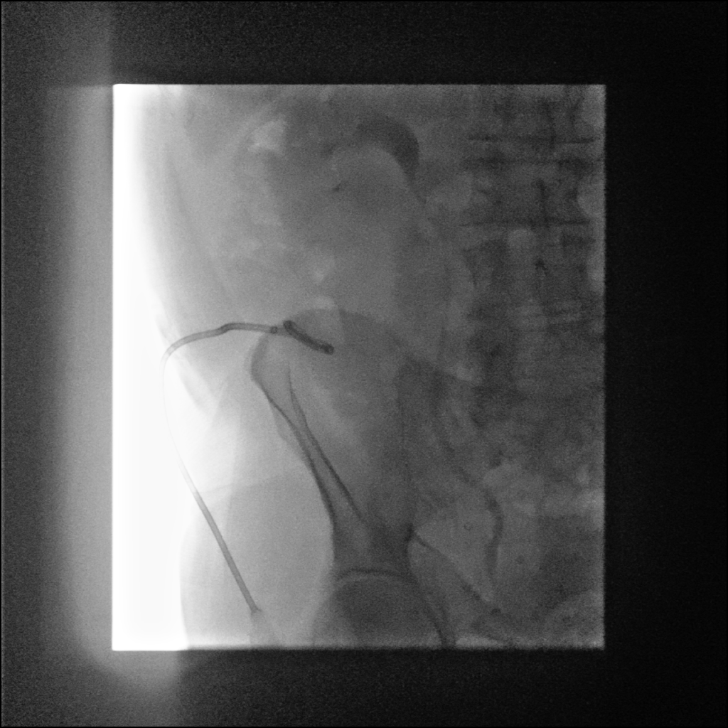

[Series 2: sequence · 4 of 11 frames shown (1 of 2)]
[frame 2/11]
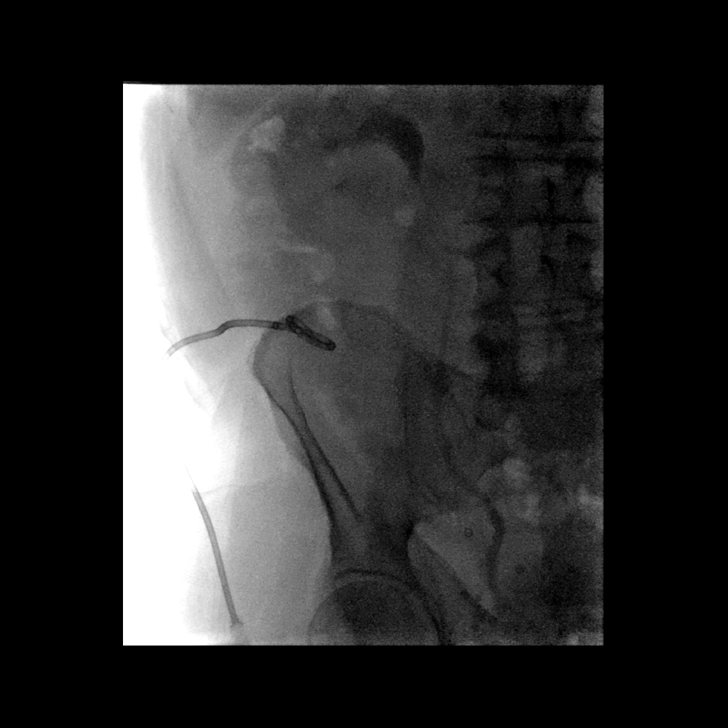
[frame 4/11]
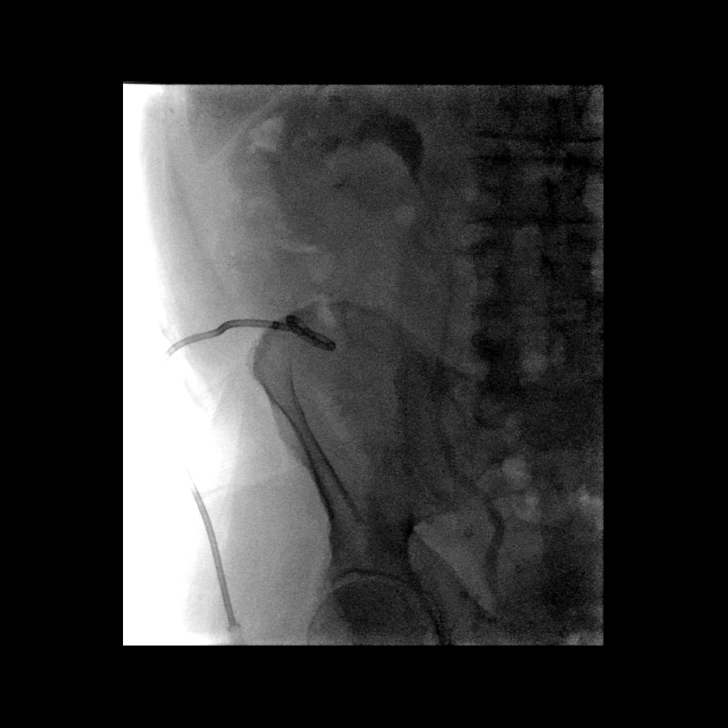
[frame 6/11]
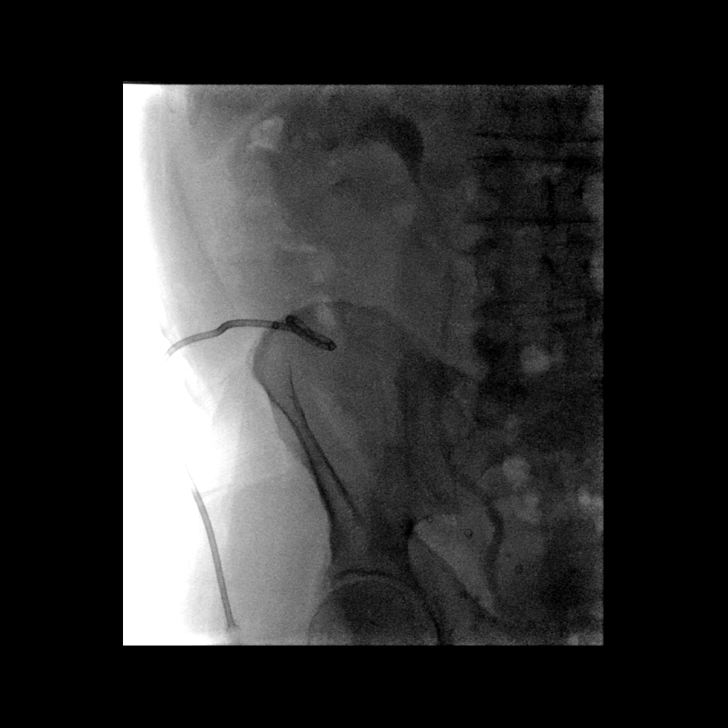
[frame 10/11]
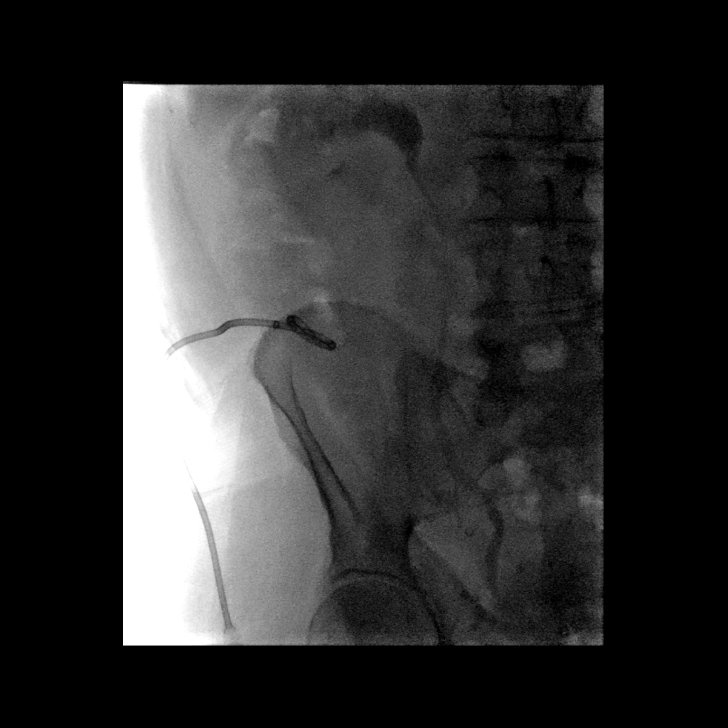

[Series 3: sequence · 4 of 57 frames shown (2 of 2)]
[frame 9/57]
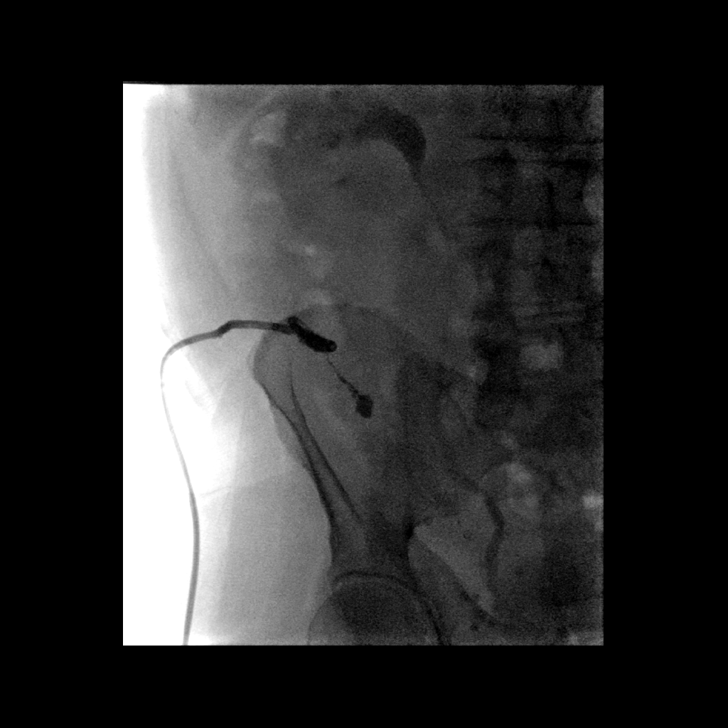
[frame 23/57]
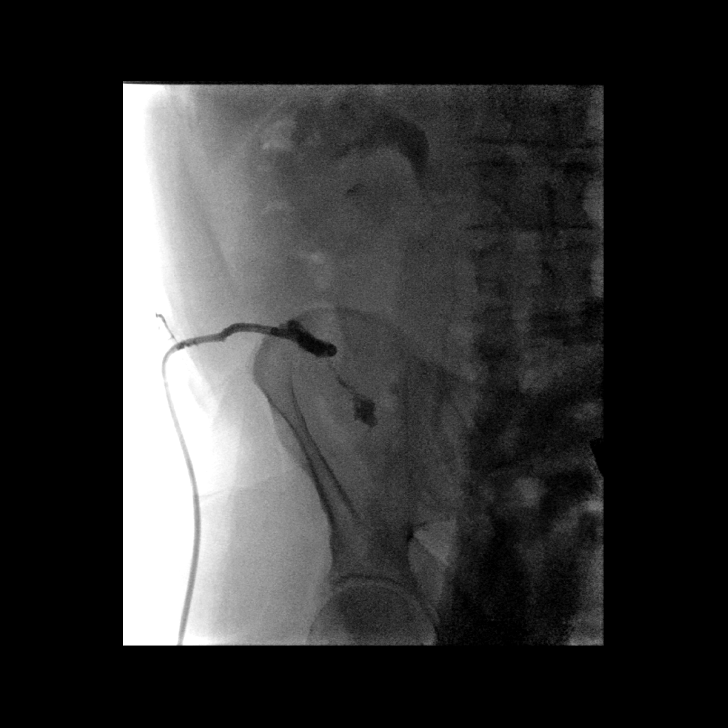
[frame 29/57]
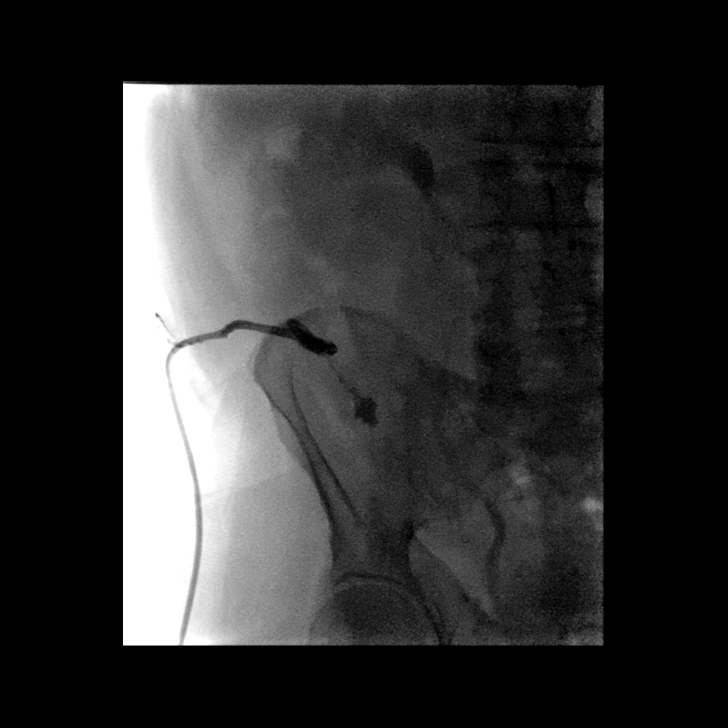
[frame 49/57]
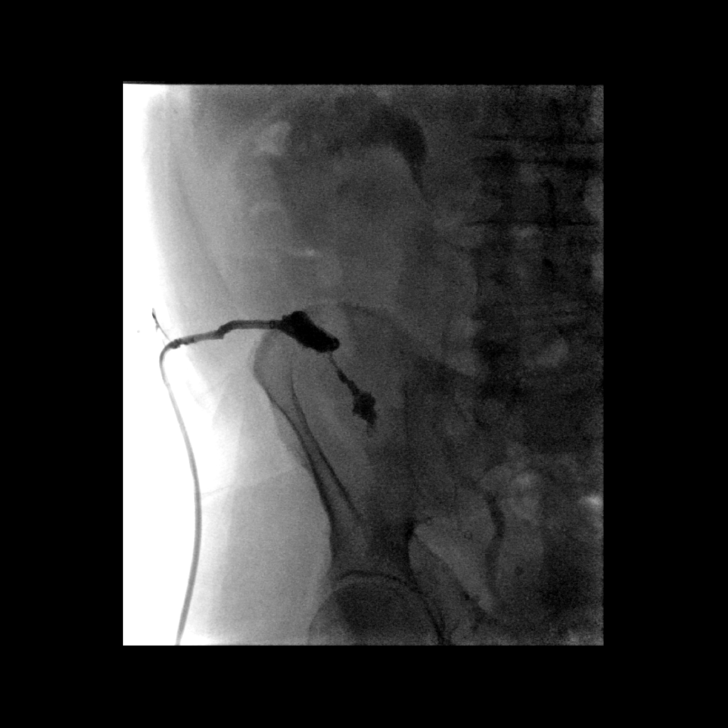

[9 of 9 positions shown; findings below may reference images not displayed]

EXAM:
ABSCESS DRAIN INJECTION

MEDICATIONS:
The patient is currently admitted to the hospital and receiving
intravenous antibiotics. The antibiotics were administered within an
appropriate time frame prior to the initiation of the procedure.

ANESTHESIA/SEDATION:
None

COMPLICATIONS:
None immediate.

PROCEDURE:
Informed written consent was obtained from the patient after a
thorough discussion of the procedural risks, benefits and
alternatives. All questions were addressed. Maximal Sterile Barrier
Technique was utilized including caps, mask, sterile gowns, sterile
gloves, sterile drape, hand hygiene and skin antiseptic. A timeout
was performed prior to the initiation of the procedure.

Contrast was injected into the right lower quadrant drain. It was
cut and removed without complication.
FINDINGS: The abscess has resolved. Contrast does dissect inferiorly into a
potential space. There is no evidence of fistula.
IMPRESSION: The abscess has resolved and there is no evidence of fistula. The
drain was removed without complication.

## 2017-12-13 ENCOUNTER — Other Ambulatory Visit: Payer: Self-pay | Admitting: *Deleted

## 2017-12-13 MED ORDER — RIVAROXABAN 20 MG PO TABS: 20.0000 mg | ORAL_TABLET | Freq: Every day | ORAL | 11 refills | Status: DC

## 2017-12-30 ENCOUNTER — Ambulatory Visit: Payer: Medicare Other | Admitting: Neurology

## 2017-12-30 ENCOUNTER — Encounter: Payer: Self-pay | Admitting: Neurology

## 2017-12-30 VITALS — BP 102/69 | HR 71 | Ht 68.25 in

## 2017-12-30 DIAGNOSIS — R159 Full incontinence of feces: Secondary | ICD-10-CM

## 2017-12-30 DIAGNOSIS — F1027 Alcohol dependence with alcohol-induced persisting dementia: Secondary | ICD-10-CM

## 2017-12-30 DIAGNOSIS — R454 Irritability and anger: Secondary | ICD-10-CM

## 2017-12-30 DIAGNOSIS — R441 Visual hallucinations: Secondary | ICD-10-CM | POA: Diagnosis not present

## 2017-12-30 DIAGNOSIS — N3944 Nocturnal enuresis: Secondary | ICD-10-CM | POA: Insufficient documentation

## 2017-12-30 NOTE — Patient Instructions (Signed)
Alzheimer Disease Caregiver Guide A person who has Alzheimer disease may not be able to take care of himself or herself. He or she may need help with simple tasks. The tips below can help you care for the person. Memory loss and confusion If the person is confused or cannot remember things:  Stay calm.  Respond with a short answer.  Avoid correcting him or her in a way that sounds like scolding.  Try not to take it personally, even if he or she forgets your name.  Behavior changes The person may go through behavior changes. This can include depression, anxiety, anger, or seeing things that are not there. When behavior changes:  Try not to take behavior changes personally.  Stay calm and patient.  Do not argue or try to convince the person about a specific point.  Know that these changes are part of the disease process. Try to work through it.  Tips to lessen frustration  Make appointments and do daily tasks when the person is at his or her best.  Take your time. Simple tasks may take longer. Allow plenty of time to complete tasks.  Limit choices for the person.  Involve the person in what you are doing.  Stick to a routine.  Avoid new or crowded places, if possible.  Use simple words, short sentences, and a calm voice. Only give 1 direction at a time.  Buy clothes and shoes that are easy to put on and take off.  Let people help if they offer. Home safety  Keep floors clear. Remove rugs, magazine racks, and floor lamps.  Keep hallways well lit.  Put a handrail and nonslip mat in the bathtub or shower.  Put childproof locks on cabinets that have dangerous items in them. These items include medicine, alcohol, guns, toxic cleaning items, sharp tools, matches, or lighters.  Place locks on doors where the person cannot see or reach them. This helps the person to not wander out of the house and get lost.  Be prepared for emergencies. Keep a list of emergency phone  numbers and addresses in a handy area. Plans for the future  Talk about finances. ? Talk about money management. People with Alzheimer disease have trouble managing their money as the disease gets worse. ? Get help from professional advisors about financial and legal matters.  Talk about future care. ? Choose a power of attorney. This is someone who can make decisions for the person with Alzheimer disease when he or she can no longer do so. ? Talk about driving and when it is the right time to stop. The person's doctor can help with this. ? If the person lives alone, make sure he or she is safe. Some people need extra help at home. Other people need more care at a nursing home or care center. Support groups Some benefits of joining a support group include:  Learning ways to manage stress.  Sharing experiences with others.  Getting emotional comfort and support.  Learning new caregiving skills as the disease progresses.  Knowing what community resources are available and taking advantage of them.  Get help if:  The person has a fever.  The person has a sudden behavior change that does not get better with calming strategies.  The person is unable to manage his or her living situation.  The person threatens you or anyone else, including himself or herself.  You are no longer able to care for the person. This information is not   intended to replace advice given to you by your health care provider. Make sure you discuss any questions you have with your health care provider. Document Released: 02/15/2012 Document Revised: 04/30/2016 Document Reviewed: 01/13/2012 Elsevier Interactive Patient Education  2017 [employer]  

## 2017-12-30 NOTE — Progress Notes (Signed)
Provider:  Larey [name], M D  Referring Provider: Cari Caraway, MD Primary Care Physician:  John Caraway, MD  Chief Complaint  Patient presents with  . New Patient (Initial Visit)    He is here today to be evaluated for his decline which dramatically changed after appendix ruptured in 2018. Patient was diagnosed with Dementia by PCP. She also reports that his R hand is contracted.    Marland Kitchen Room 10    He is here with his wife.    HPI:  John [name] is a 67 y.o. 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   I have seen John [name] in the past, 15 and 11 and 5 years ago.  His wife tells me today that he underwent a long hospitalization after his appendix ruptured the last year [facility] thousand 67 and stayed for several weeks.  He was also evaluated for possible retroperitoneal bleed, peritonitis, and in the meantime his cognitive function has further declined.  I also reviewed his oncologist hematologist report about his polycythemia and at this time he does not require any bloodletting.   The patient developed a contracture on the right hand since last summer initially just swollen but now clenched. He had swelling  At the time of discharge from hospital it just is swelling over his hand and wrist, initially his wife applied topical Benadryl and nitro pills and the swelling went down.  Then it returned Dr. Leonides [name] ordered some steroids but also reduce the swelling once again after it returned later it formed a contraction. Dr. Alvy [name] has seen him and wondered if he had a stroke. CT head was not showin stroke but atrophy. See below. Reviewed images with wife.   Appears much older than his numeric age much different from how I remember him.  He also to be in his own little world mumbling, perseverates short statements" I love you" why do you do that for ? "  laughing or moaning out of context.  MMSE deferred- he cannot longer remember his  wife's name, but recognizes her. He has known her since he was 76 and she 18.  There is a person with him during the day at home as she works full-time in the police department.  Here is my last note: " John [name] is a 67 y.o. male was seen here as a referral/ revisit  from Dr. Addison [name] for memory loss.  The patient was first evaluated in 2004 for memory problems at the time he presented his tremor and a high alcohol DD use powder he was also still an active smoker in 2003 -2004. In March of this 2014 he presented again and his wife technologist work finding difficulties trouble for example to remember the names of the plants he had gone in the garden, sometimes he was unable to communicate fluidly on the phone especially when it came to on seeing the business related form colds. 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 She has meanwhile decided to close the business and apply for disability.  Now he has meanwhile stop smoking he still consumes about 4 -6 drinks a day. In March His Mini-Mental Status Examination was 21/ 30 points and  his laboratory results showed B12 deficiency - likely a deficiency for other B. vitamins, microcytosis The last time the patient had completely stopped drinking for a period of time was 6  years ago in preparation for hernia surgery. His physical exam continued to show memory loss, hyperreflexia, nystagmus and ataxia. There was neuropathy as well. The family is well aware of the memory loss relation to ETOH and are supportive. They're discussing today to get [name]  treatment - he will need to stop drinking - he would find more support in seeing Dr. Laren Everts and visiting AA meetings if possible. 2014- Mini-Mental Status Examination his animal fluency test was only 9, he was not able to write a fluent sentence and he has a mild tremor when writing. 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 " CD   Review of Systems: Out of a complete 14 system review, the patient complains of only the following symptoms, and all other reviewed systems are negative.  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  Had peritonitis following appendicitis.   Social History   Socioeconomic History  . Marital status: Married    Spouse name: Not on file  . Number of children: 2  . Years of education: Not on file  . Highest education level: High school graduate  Social Needs  . Financial resource strain: Not on file  . Food insecurity - worry: Not on file  . Food insecurity - inability: Not on file  . Transportation needs - medical: Not on file  . Transportation needs - non-medical: Not on file  Occupational History  . Occupation: Loss adjuster, chartered  Tobacco Use  . Smoking status: Former Smoker    Last attempt to quit: 12/07/1972    Years since quitting: 45.0  . Smokeless tobacco: Never Used  Substance and Sexual Activity  . Alcohol use: No  . Drug use: No  . Sexual activity: Yes  Other Topics Concern  . Not on file  Social History Narrative   He lives at home with his wife   Left handed   Rare caffeine     Family History  Problem Relation Age of Onset  . Glaucoma Mother   . Dementia Mother   . Stroke Father   . Clotting disorder Father   . Dementia Father   . Clotting disorder Sister     Past Medical History:  Diagnosis Date  . Adenomatous colon polyp   . Alcohol dependence ([place])   . Ataxia 07/05/2013  . Conjunctivitis, allergic   . 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 Hyperlipemia   . Hypertension   . Memory loss    dementia was in question in 2004  . Peyronie's disease    presumptive,resolved  . Polycythemia   . Polycythemia vera ([place]) 11/07/2015  . Pulmonary embolism ([place]) 2016  . Ruptured appendix 05/2017  . Vitamin B 12 deficiency     Past Surgical History:  Procedure  Laterality Date  . FRACTURE SURGERY  1997   leg  . INGUINAL HERNIA REPAIR Bilateral    w/ mesh  . IR RADIOLOGIST EVAL & MGMT  06/23/2017  . LAPAROSCOPIC APPENDECTOMY N/A 07/29/2017   Procedure: LAPAROSCOPIC APPENDECTOMY;  Surgeon: Jovita Kussmaul, MD;  Location: [facility];  Service: General;  Laterality: N/A;  . TONSILLECTOMY  1973    Current Outpatient Medications  Medication Sig Dispense Refill  . citalopram (CELEXA) 20 MG tablet Take 20 mg by mouth daily.    . risperiDONE (RISPERDAL) 0.25 MG tablet Take by mouth. Take 1 tablet in the morning and 2 tablets in  the evening    . rivaroxaban (XARELTO) 20 MG TABS tablet Take 1 tablet (20 mg total) by mouth daily with supper. (Patient taking differently: Take 10 mg by mouth daily with supper. ) 30 tablet 11  . loratadine (CLARITIN) 10 MG tablet Take 10 mg by mouth daily as needed for allergies.      No current facility-administered medications for this visit.     Allergies as of 12/30/2017 - Review Complete 12/30/2017  Allergen Reaction Noted  . Flonase [fluticasone] Other (See Comments) 07/27/2017    Vitals: BP 102/69 (BP Location: Left Arm, Patient Position: Sitting)   Pulse 71   Ht 5' 8.25" (1.734 m)   BMI 24.03 kg/m  Last Weight:  Wt Readings from Last 1 Encounters:  09/20/17 159 lb 3.2 oz (72.2 kg)   Last Height:   Ht Readings from Last 1 Encounters:  12/30/17 5' 8.25" (1.734 m)    Physical exam:  General: The patient is not in acute distress. The patient is not fully aware of his around his eyes closed with a limited conversation.  Also does not connect meaningfully with his wife or in the conversation.  He appears aged beyond his numeric age.  He seems to see and hear things that we cannot see and hear.  Head: Normocephalic, atraumatic. Neck is supple. Cardiovascular:  Regular rate and rhythm, without  murmurs or carotid bruit, and without distended neck veins. Respiratory: Lungs are clear to auscultation. Skin:  Without  evidence of edema, or rash Trunk: BMI is 24.  Neurologic exam : The patient is unaware and not able to orient to place, person and time.  There is no pseudobulbar affect, the patient appeared on the verge of tears but then looks quite content and is  even smiling again. Cranial nerves: Pupils are equal  Funduscopic exam deferred. Extraocular movements cannot be fully evaluated. He keeps his eyes closed . Hearing to finger rub intact. Facial touch leads to rooting reflex, he tried to suck the q tip.   Facial motor strength is symmetric and tongue and uvula move midline. He has swallowed  water without difficulties.  Tongue protrusion into either cheek could not be provoked. . Shoulder shrug is normal.   Motor exam:  Contracture of right wrist , hand - swollen and skin is bluish and shiny.  Sensory:  Cannot cooperate  Coordination:  Deep tendon reflexes: in the  upper and lower extremities are symmetric.  CLINICAL DATA:  Dementia with altered mental status.  Fever.  EXAM: CT HEAD WITHOUT CONTRAST  TECHNIQUE: Contiguous axial images were obtained from the base of the skull through the vertex without intravenous contrast.  COMPARISON:  June 01, 2017  FINDINGS: Brain: Moderately severe diffuse atrophy remains stable. There is no intracranial mass, hemorrhage, extra-axial fluid collection, or midline shift. There is mild small vessel disease in the centra semiovale bilaterally. Elsewhere gray-white compartments appear normal. No acute infarct evident.  Vascular: No hyperdense vessel. There is calcification in each carotid siphon region, more on the left than on the right.  Skull: Bony calvarium appears intact.  Sinuses/Orbits: Visualized paranasal sinuses are clear. Visualized orbits appear symmetric bilaterally.  Other: Mastoid air cells are clear.  IMPRESSION: Stable diffuse atrophy. Slight periventricular small vessel disease. No intracranial mass, hemorrhage, or  extra-axial fluid collection. No evidence of acute infarct. There are foci of arterial vascular calcification.   Electronically Signed   By: Lowella Grip III M.D.   On: 06/08/2017 10:48  Assessment:  After physical and neurologic examination, review of laboratory studies, imaging, neurophysiology testing and pre-existing records, assessment is that of :   Advanced dementia with pseudobulbar affect, hallucinations, and decline in motor function.  The complications of his medical history in 2018 including a ruptured appendix, peritonitis and prolonged hospital stay were certainly progressing dementia.  The contracture of the right wrist and fingers is either a sign of an injury by a infiltrating IV fluid, possibly a medication with a different pH.  Mrs. Happ remembers that he had an infiltrated IV that looked infected and inflamed in his right arm at the time of his hospital discharge.  CT of the head obtained after his admission for the ruptured appendix did not show signs of a stroke but global atrophy.  Significant atrophy for age.  This brain organic syndrome is related to an advanced dementia.  Plan:  Treatment plan and additional workup : At this stage and given that we do not have a specific treatment for many dementia forms it is most important that Mr. Koleen Distance has a safe environment that is protecting him from harming/ injuring himself. This seems to have been a good system in place now with daytime caregivers at the home so he has not had to leave his familiar environment.  He is incontinent now to urine and stool, a Mini-Mental test cannot longer be established but 5 years ago he already only scored 9 out of 30 points. He certainly had a young age for onset, beginning slowly in his mid 29s. He had a severe head injury in 1999 and followed by personality changes and cognitive decline.  - TBI and alcohol together, and tremors beginning in 2004.  The presence of  hallucinations apparently auditory and visual now are most often seen in Lewy body dementia, but the patient has a very history cognitive problems so that I think that if this is multifactorial.  Hospice involvement discussed- will order an evaluation.Asencion Partridge Zamyah Wiesman MD 12/30/2017

## 2017-12-31 ENCOUNTER — Telehealth: Payer: Self-pay | Admitting: Neurology

## 2017-12-31 NOTE — Telephone Encounter (Signed)
During yesterdays visit we discussed sending the referral to hospice of [facility]- please make sure that this is changed to [place]. , avoiding duplication.

## 2017-12-31 NOTE — Telephone Encounter (Signed)
Dr. Brett Fairy Patient has to go with [place] because of her address I contacted [place] . Patient's wife relayed she is going to be opened minded and and give the referral a chance.

## 2017-12-31 NOTE — Telephone Encounter (Addendum)
Per Dr. Brett Fairy, pt's PCP should be the attending of record. PCP is Dr. Cari Caraway. Called [facility] and spoke with [employer]. She is aware that [facility] Dyer will be sending the referral (office open until noon today then closed through the weekend). Also aware that per Dr. Brett Fairy, pt's PCP Cari Caraway should be the attending of record. She verbalized understanding and said she would document the info.

## 2017-12-31 NOTE — Telephone Encounter (Signed)
[place] and spoke with [employer]. She stated that Dewaine Oats was unavailable at the moment but had access to phone call records. She stated that the daughter-in-law of Joshva had contacted them and stated that they lived in [place] but preferred to use [facility]. Dewaine Oats contacted Alexiz's wife who confirmed. [facility] would like to know if Dr. Brett Fairy will be the AR (attending of record) signing for hospice orders. I informed her that I would send this message to Dr. Brett Fairy and the staff who work with referrals and we would let them know. As of now, referral had not been officially faxed/sent to them.

## 2017-12-31 NOTE — Telephone Encounter (Signed)
John [name](over referrals ) for [facility] has called re: the referring of pt to them by dr Dohmeier.  Dewaine Oats would like to know if Dr Brett Fairy will send referral and inquired about other information.  Dewaine Oats would like a call back as soon  As possible

## 2017-12-31 NOTE — Telephone Encounter (Signed)
Thank you :)

## 2018-01-11 NOTE — Telephone Encounter (Signed)
Please give me the phone number for [facility] and we can send her l visit notes. This is a progressive dementia.

## 2018-01-11 NOTE — Telephone Encounter (Signed)
Yes I verified with [facility] Dyer and they are in with epic and can view all your notes through epic.

## 2018-01-11 NOTE — Telephone Encounter (Signed)
John [name], is there a way to make sure Dewaine Oats has the office notes?

## 2018-01-11 NOTE — Telephone Encounter (Signed)
[facility] number [phone] or call intake center [phone].  They are connected with Epic.

## 2018-01-25 NOTE — Telephone Encounter (Signed)
All noted, CD

## 2018-01-27 ENCOUNTER — Telehealth: Payer: Self-pay | Admitting: Neurology

## 2018-01-27 ENCOUNTER — Encounter: Payer: Self-pay | Admitting: Neurology

## 2018-01-27 NOTE — Telephone Encounter (Signed)
Pt's wife returned providers call, she does not have VM set on her cell phone.  This is the information Dr D requested while I had the pt's wife on the phone via skype:  He is at home with her  The nurse name is Donnie Aho, Hospice is [facility]

## 2018-01-27 NOTE — Telephone Encounter (Signed)
The patient was admitted with hospice about 3-4 wks ago and on Saturday they gave him tramadol to help with managing pain. The patient at that time after taking that became very somnolent. They stopped the medication on Tuesday. Since stopping the pt has become more agitated, belligerent behavior. The hospice nurse reached out to the patients PCP Dr Leonides Schanz and they deferred to [place] and would like to see what Dr Dohmeier's thoughts are on if this is something that just was a interaction between the medication he was currently taking and tramadol. The family was questioning on whether the risperidone or the celexa may need to be adjusted or if there was a new  Medication that needed to be started to help with his behavior. The hospice nurse states the pt has become a more increased fall risk and so they have made a judgement call to take the patient off of Xarelto and just wanted to inform us of that. I will call the hospice nurse back with Dr Dohmeier's recommendations. [phone].

## 2018-01-27 NOTE — Telephone Encounter (Signed)
Call from Hospice regarding the patient's oversedation on TRAMADOL-  I understand that the medication was d/c , he became alert, and is now aggressive and verbally abusive behavior   PLan:  I would increase risperidone or use Seroquel  and not add opioid.   Larey Seat, MD

## 2018-01-28 ENCOUNTER — Other Ambulatory Visit: Payer: Self-pay | Admitting: Neurology

## 2018-01-28 MED ORDER — RISPERIDONE 0.5 MG PO TABS: 0.5000 mg | ORAL_TABLET | Freq: Three times a day (TID) | ORAL | 5 refills | Status: AC

## 2018-01-28 NOTE — Telephone Encounter (Signed)
Dr Dohmeier spoke with the nurse and she increased the risperidone 0.5 three times a day. The patient may also have ibuprofen for pain. The hospice nurse is aware

## 2018-01-31 NOTE — Telephone Encounter (Signed)
Yes, this can be related to both cognitive status in dementia and medication- agitation is common with dementia patients, and has not lost awareness/ LOC .  His appetite is certainly affected by sedating medication- but he likely no longer feels hungry.  Offer food and if he doesn't want to eat do not force him.

## 2018-01-31 NOTE — Telephone Encounter (Signed)
Jessica/Hospice (c) [phone] called to advise on Saturday night the patient did not sleep well, he was very weak, eyes deviated to one side and became fixed, he did not lose consciousness. This happened on 2 occasions. He was agitated. Yesterday evening he was more angry like, last night he slept well, today he is not eating. She is wanting to know if this could be medicine related. Please call to advise.

## 2018-01-31 NOTE — Telephone Encounter (Signed)
LM for John [name] to call me back.

## 2018-01-31 NOTE — Telephone Encounter (Signed)
I spoke with John [name] and was able to get a more clear reason for her call. John [name] states that she is wanting to know if his sudden decline is more medication related or could this be more of an "end of life" discussion? She states that there has been some confusion as to how he should be taking the risperidone? She is aware that it was just increased to 0.5mg  this past weekend, but should they be spacing them out and giving them every 8 hours or as one nurse reported, he was taking 1 in the AM and 2 at bedtime? Please advise. If the administration of the medication is not an issue, she wonders if this increase would have an affect on his behavior this soon? Or should they begin "end of life" discussion? John [name] says she just doesn't know enough about this medication to discern whether or not this sudden change is medication or disease related. John [name] can be reached on her cell, [phone].

## 2018-02-01 NOTE — Telephone Encounter (Signed)
I called John [name] with hospice and explained that Dr Brett Fairy feels this is end stage Dementia related. She does agree that end of life discussion should be made. I also clarified for her the risperdone dose is 0.5 mg in the am and then 1.mg at bedtime. John [name] verbalized understanding and was appreciative for the call.

## 2018-02-04 DEATH — deceased

## 2018-08-17 ENCOUNTER — Telehealth: Payer: Self-pay | Admitting: Hematology and Oncology

## 2018-08-17 NOTE — Telephone Encounter (Signed)
Called patient to reschedule 10/18 appt per NG.  Per patient's wife, patient passed in February.  Appts canceled.

## 2018-08-17 NOTE — Telephone Encounter (Signed)
9/11:  Let NG and Erline Levine, R.N., know that patient deceased as of 02/25/2023 per wife.

## 2018-09-23 ENCOUNTER — Ambulatory Visit: Payer: Medicare Other | Admitting: Hematology and Oncology

## 2018-09-23 ENCOUNTER — Other Ambulatory Visit: Payer: Medicare Other

## 2019-01-02 ENCOUNTER — Ambulatory Visit: Payer: Medicare Other | Admitting: Neurology
